# Patient Record
Sex: Female | Born: 1935
Health system: Southern US, Community
[De-identification: ages and names within clinical notes are randomized; demographics above are authoritative.]

## PROBLEM LIST (undated history)

## (undated) DIAGNOSIS — Z87891 Personal history of nicotine dependence: Secondary | ICD-10-CM

## (undated) DIAGNOSIS — Z8619 Personal history of other infectious and parasitic diseases: Secondary | ICD-10-CM

## (undated) DIAGNOSIS — M25579 Pain in unspecified ankle and joints of unspecified foot: Secondary | ICD-10-CM

## (undated) DIAGNOSIS — I10 Essential (primary) hypertension: Secondary | ICD-10-CM

## (undated) DIAGNOSIS — E785 Hyperlipidemia, unspecified: Secondary | ICD-10-CM

## (undated) DIAGNOSIS — E559 Vitamin D deficiency, unspecified: Principal | ICD-10-CM

## (undated) DIAGNOSIS — B029 Zoster without complications: Secondary | ICD-10-CM

## (undated) DIAGNOSIS — B019 Varicella without complication: Secondary | ICD-10-CM

## (undated) DIAGNOSIS — Z86010 Personal history of colonic polyps: Secondary | ICD-10-CM

## (undated) DIAGNOSIS — Z0000 Encounter for general adult medical examination without abnormal findings: Secondary | ICD-10-CM

## (undated) DIAGNOSIS — R945 Abnormal results of liver function studies: Secondary | ICD-10-CM

## (undated) DIAGNOSIS — E119 Type 2 diabetes mellitus without complications: Secondary | ICD-10-CM

## (undated) DIAGNOSIS — L209 Atopic dermatitis, unspecified: Secondary | ICD-10-CM

## (undated) DIAGNOSIS — R739 Hyperglycemia, unspecified: Secondary | ICD-10-CM

## (undated) DIAGNOSIS — H43391 Other vitreous opacities, right eye: Secondary | ICD-10-CM

## (undated) HISTORY — DX: Atopic dermatitis, unspecified: L20.9

## (undated) HISTORY — DX: Other vitreous opacities, right eye: H43.391

## (undated) HISTORY — DX: Personal history of other infectious and parasitic diseases: Z86.19

## (undated) HISTORY — DX: Type 2 diabetes mellitus without complications: E11.9

## (undated) HISTORY — DX: Personal history of nicotine dependence: Z87.891

## (undated) HISTORY — DX: Varicella without complication: B01.9

## (undated) HISTORY — DX: Personal history of colonic polyps: Z86.010

## (undated) HISTORY — DX: Vitamin D deficiency, unspecified: E55.9

## (undated) HISTORY — DX: Essential (primary) hypertension: I10

## (undated) HISTORY — DX: Hyperlipidemia, unspecified: E78.5

## (undated) HISTORY — DX: Encounter for general adult medical examination without abnormal findings: Z00.00

## (undated) HISTORY — DX: Zoster without complications: B02.9

## (undated) HISTORY — DX: Hyperglycemia, unspecified: R73.9

## (undated) HISTORY — DX: Abnormal results of liver function studies: R94.5

## (undated) HISTORY — DX: Pain in unspecified ankle and joints of unspecified foot: M25.579

---

## 1960-07-04 LAB — HM PAP SMEAR

## 1998-01-30 ENCOUNTER — Emergency Department (HOSPITAL_COMMUNITY): Admission: EM | Admit: 1998-01-30 | Discharge: 1998-01-30 | Payer: Self-pay | Admitting: Emergency Medicine

## 1998-04-24 ENCOUNTER — Encounter: Admission: RE | Admit: 1998-04-24 | Discharge: 1998-06-24 | Payer: Self-pay | Admitting: Anesthesiology

## 2000-12-17 ENCOUNTER — Encounter: Payer: Self-pay | Admitting: Emergency Medicine

## 2000-12-17 ENCOUNTER — Inpatient Hospital Stay (HOSPITAL_COMMUNITY): Admission: EM | Admit: 2000-12-17 | Discharge: 2000-12-18 | Payer: Self-pay | Admitting: *Deleted

## 2004-06-12 ENCOUNTER — Emergency Department (HOSPITAL_COMMUNITY): Admission: EM | Admit: 2004-06-12 | Discharge: 2004-06-12 | Payer: Self-pay | Admitting: *Deleted

## 2004-06-14 ENCOUNTER — Encounter: Admission: RE | Admit: 2004-06-14 | Discharge: 2004-06-14 | Payer: Self-pay | Admitting: Family Medicine

## 2006-05-03 ENCOUNTER — Emergency Department (HOSPITAL_COMMUNITY): Admission: EM | Admit: 2006-05-03 | Discharge: 2006-05-03 | Payer: Self-pay | Admitting: *Deleted

## 2006-07-04 DIAGNOSIS — H269 Unspecified cataract: Secondary | ICD-10-CM | POA: Insufficient documentation

## 2006-07-04 HISTORY — PX: CATARACT EXTRACTION W/ INTRAOCULAR LENS  IMPLANT, BILATERAL: SHX1307

## 2007-05-24 ENCOUNTER — Ambulatory Visit (HOSPITAL_COMMUNITY): Admission: RE | Admit: 2007-05-24 | Discharge: 2007-05-24 | Payer: Self-pay | Admitting: Ophthalmology

## 2007-06-21 ENCOUNTER — Ambulatory Visit (HOSPITAL_COMMUNITY): Admission: RE | Admit: 2007-06-21 | Discharge: 2007-06-21 | Payer: Self-pay | Admitting: Ophthalmology

## 2010-07-24 ENCOUNTER — Encounter: Payer: Self-pay | Admitting: Unknown Physician Specialty

## 2010-11-19 NOTE — Consult Note (Signed)
[facility]  Patient:    Natalie Swanson, Natalie Swanson                    MRN: [id number] Proc. Date: 12/17/00 Adm. Date:  [id number] Attending:  Koren Bound                          Consultation Report  HISTORY OF PRESENT ILLNESS:  Natalie Swanson is a 75 year old with no significant past medical history.  She presents to [facility] Emergency Room with episodes of chest pain.  The patient has been in relatively good health. For the past week or so, she has had some intermittent episodes of chest pain. These episodes of pain typically are in the middle of her chest and radiate up to her neck and to her back between her shoulder blades.  These episodes of pain are typically worse with exertion and improved with rest.  She has not had any pleuritic chest pain.  She has had significant episodes of pain with exertion.  CURRENT MEDICATIONS:  None.  ALLERGIES:  She has no known drug allergies.  PAST MEDICAL HISTORY:  None.  SOCIAL HISTORY:  The patient smokes a half a pack a day.  She does not drink any alcohol.  FAMILY HISTORY:  Strongly positive for coronary artery disease.  REVIEW OF SYSTEMS:  Her review of systems was reviewed today and is essentially negative.  PHYSICAL EXAMINATION:  GENERAL:  This is a middle-aged female in no acute distress.  She is currently pain-free.  VITAL SIGNS:  Her blood pressure is 146/76, heart rate is 88 and her temperature is 98.2.  HEENT/NECK:  Exam reveals 2+ carotids.  There are no bruits and no JVD.  LUNGS:  Clear to auscultation.  HEART:  Regular rate.  S4, S1, S2.  ABDOMEN:  Good bowel sounds.  She has no hepatosplenomegaly and no masses or bruits.  EXTREMITIES:  No clubbing, cyanosis, or edema.  Her calves are nontender.  Her pulses are 2+ and symmetric bilaterally.  NEUROLOGIC:  Exam is nonfocal.  Her cranial nerves II-XII are intact and her motor and sensory function are intact.  LABORATORY  DATA:  Her EKG reveals normal sinus rhythm.  She has nonspecific ST and T wave abnormalities.  There is no obvious ischemia.  Her troponin is 0.02.  Her CPK is 99 with 0.8 MBs.  Her sodium is 136, potassium is 3.3, chloride is 104, CO2 is 27, glucose is 92, BUN is 5, creatinine 0.8.  Her SGOT is 19, SGPT is 15, bilirubin is 0.5.  Her WBC is 5.1, hemoglobin is 12.5, hematocrit is 36.3, platelet count is 190,000.  Her pro time is 13.4 with an INR of 1.1.  ASSESSMENT:  The patient presents with symptoms consistent with unstable angina.  Her symptoms are quite concerning and she does have some very mild although nonspecific ST and T wave abnormalities.  At this point, I would prefer that we proceed with heart catheterization.  We discussed the risks, benefits and options of heart catheterization.  She understands and agrees to proceed.  We will check a fasting lipid profile; we will also supplement her potassium. DD:  12/17/00 TD:  12/18/00 Job: [id number]

## 2010-11-19 NOTE — H&P (Signed)
[place]  Patient:    Natalie Swanson, Natalie Swanson                    MRN: [id number] Adm. Date:  [id number] Attending:  Koren Bound                         History and Physical  No dictation... DD:  12/17/00 TD:  12/17/00 Job: [id number]

## 2010-11-19 NOTE — Discharge Summary (Signed)
[facility]  Patient:    Natalie Swanson, Natalie Swanson                    MRN: [id number] Adm. Date:  [id number] Disc. Date: [id number] Attending:  Koren Bound                           Discharge Summary  DISCHARGE DIAGNOSIS:  Noncardiac chest pain.  PROCEDURE:  Heart catheterization.  DISCHARGE MEDICATIONS: 1. Enteric coated aspirin once a day. 2. Nitroglycerin 0.4 mg as needed. 3. Paxil 20 mg a day.  FOLLOWUP:  The patient is to see Dr. Elease Hashimoto in one week.  HISTORY OF PRESENT ILLNESS:  Ms. Castell is a middle-aged female who was admitted through the [facility] Emergency Room with episodes of chest pain. Please see dictated H&P for further details.  HOSPITAL COURSE:  The patient ruled out for myocardial infarction.  A heart catheterization done the following morning revealed mild coronary artery disease.  She had a tight stenosis of a very small diagonal branch which was too small for angioplasty.  It was decided to treat her medically.  She was stable and was discharged in satisfactory condition.  She will follow up with Dr. Elease Hashimoto for further management of her very minimal coronary artery disease. DD:  01/03/01 TD:  01/04/01 Job: [id number]

## 2010-11-19 NOTE — Cardiovascular Report (Signed)
[place]. [facility]  Patient:    Natalie Swanson, Natalie Swanson                    MRN: [id number] Proc. Date: 12/18/00 Adm. Date:  [id number] Attending:  Koren Bound                        Cardiac Catheterization  INDICATIONS:  The patient is a 75 year old female, with no significant past medical history.  She was admitted through the [facility] Emergency Room last night with episodes of chest pain with exertion.  She is referred for a heart catheterization for further evaluation.  PROCEDURES:  Left heart catheterization with coronary angiography with left ventriculogram and an aortic root shot.  DESCRIPTION OF PROCEDURE:  The right femoral artery was easily cannulated using the modified Seldinger technique.  HEMODYNAMICS:  The left ventricular pressures were 138/10.  The aortic pressure was 138/81.  ANGIOGRAPHY:  The left main coronary artery has minor luminal irregularities. There are no plaque ruptures and no severe stenoses.  The left anterior descending artery is a moderate to small vessel.  There are minor to moderate irregularities in the proximal segment.  The mid LAD is fairly smooth and normal.  The distal LAD tapers fairly quickly.  There is a moderate sized diagonal branch which has a long 70-80% stenosis.  This diagonal branch is approximately 1 to 1.5 mm in diameter and is not a good candidate for PTCA.  The left circumflex artery is a moderate to large vessel.  It gives off a large obtuse marginal artery which is fairly normal.  There are a few minor luminal irregularities in the proximal aspect of this vessel.  The distal circumflex artery continues around and supplies a posterolateral branch. There is a discrete 20% stenosis but otherwise the circumflex artery is unremarkable.  The right coronary artery is a moderate to large vessel and is dominant. There is mild diffuse irregularities in the entire mid and distal segment. The  posterior descending artery is relatively free of disease.  There is a very small posterolateral segment artery.  LEFT VENTRICULOGRAM:  The left ventriculogram was performed in a 30% RAO position.  It reveals overall normal left ventricular systolic function.  The ejection fraction is 65-70%.  There is no mitral regurgitation.  An aortic root shot was performed because of her chest pain and the lack of significant coronary artery disease.  She was found to have a normal aortic root.  There is no evidence of dissection or aneurysm.  The proximal aspect of the great vessels are normal.  COMPLICATIONS:  None.  CONCLUSIONS: 1. Mild to moderate coronary artery irregularities.  Her tightest stenosis is    in a very small diagonal branch which is not a candidate for angioplasty.    We will continue with medical therapy.  She will need to stop smoking. 2. Normal left ventricular systolic function. DD:  12/18/00 TD:  12/18/00 Job: [id number]

## 2011-03-05 DIAGNOSIS — H43391 Other vitreous opacities, right eye: Secondary | ICD-10-CM

## 2011-03-05 DIAGNOSIS — B029 Zoster without complications: Secondary | ICD-10-CM

## 2011-03-05 HISTORY — DX: Other vitreous opacities, right eye: H43.391

## 2011-03-05 HISTORY — DX: Zoster without complications: B02.9

## 2011-04-06 ENCOUNTER — Ambulatory Visit (INDEPENDENT_AMBULATORY_CARE_PROVIDER_SITE_OTHER): Payer: Medicare HMO | Admitting: Family Medicine

## 2011-04-06 ENCOUNTER — Encounter: Payer: Self-pay | Admitting: Family Medicine

## 2011-04-06 VITALS — BP 126/80 | HR 70 | Temp 98.1°F | Ht 59.25 in | Wt 130.1 lb

## 2011-04-06 DIAGNOSIS — H43391 Other vitreous opacities, right eye: Secondary | ICD-10-CM

## 2011-04-06 DIAGNOSIS — B059 Measles without complication: Secondary | ICD-10-CM | POA: Insufficient documentation

## 2011-04-06 DIAGNOSIS — I10 Essential (primary) hypertension: Secondary | ICD-10-CM

## 2011-04-06 DIAGNOSIS — B269 Mumps without complication: Secondary | ICD-10-CM | POA: Insufficient documentation

## 2011-04-06 DIAGNOSIS — Z8619 Personal history of other infectious and parasitic diseases: Secondary | ICD-10-CM | POA: Insufficient documentation

## 2011-04-06 DIAGNOSIS — Z23 Encounter for immunization: Secondary | ICD-10-CM

## 2011-04-06 DIAGNOSIS — Z87448 Personal history of other diseases of urinary system: Secondary | ICD-10-CM

## 2011-04-06 DIAGNOSIS — Z87440 Personal history of urinary (tract) infections: Secondary | ICD-10-CM

## 2011-04-06 DIAGNOSIS — E785 Hyperlipidemia, unspecified: Secondary | ICD-10-CM

## 2011-04-06 DIAGNOSIS — H43399 Other vitreous opacities, unspecified eye: Secondary | ICD-10-CM

## 2011-04-06 DIAGNOSIS — B019 Varicella without complication: Secondary | ICD-10-CM | POA: Insufficient documentation

## 2011-04-06 DIAGNOSIS — Z0000 Encounter for general adult medical examination without abnormal findings: Secondary | ICD-10-CM

## 2011-04-06 MED ORDER — PROPRANOLOL HCL 40 MG PO TABS: 40.0000 mg | ORAL_TABLET | Freq: Two times a day (BID) | ORAL | Status: DC

## 2011-04-06 MED ORDER — TETANUS-DIPHTH-ACELL PERTUSSIS 5-2.5-18.5 LF-MCG/0.5 IM SUSP: 0.5000 mL | Freq: Once | INTRAMUSCULAR | Status: DC

## 2011-04-06 NOTE — Patient Instructions (Signed)
Preventative Care for Adults - Female Studies show that half of deaths in the [place] today result from unhealthy lifestyle practices. This includes ignoring preventive care suggestions. Preventive health guidelines for women include the following key practices:  A routine yearly physical is a good way to check with your primary caregiver about your health and preventive screening. It is a chance to share any concerns and updates on your health, and to receive a thorough all-over exam.   If you smoke cigarettes, find out from your caregiver how to quit. It can literally save your life, no matter how long you have been a tobacco user. If you do not use tobacco, never start.   Maintain a healthy diet and normal weight. Increased weight leads to problems with blood pressure and diabetes. Decrease saturated fat in your diet and increase regular exercise. Eat a variety of foods, including fruit, vegetables, animal or vegetable protein (meat, fish, chicken, and eggs, or beans, lentils, and tofu), and grains, such as rice. Get information about proper diet from your caregiver, if needed.   Aerobic exercise helps maintain good heart health. The CDC and the American College of Sports Medicine recommend 30 minutes of moderate-intensity exercise (a brisk walk that increases your heart rate and breathing) on most days of the week. Ongoing high blood pressure should be treated with medicines, if weight loss and exercise are not effective.   Avoid smoking, drinking too much alcohol (more than two drinks per day), and use of street drugs. Do not share needles with anyone. Ask for professional help if you need support or instructions about stopping the use of alcohol, cigarettes, or drugs.   Maintain normal blood lipids and cholesterol, by minimizing your intake of saturated fat. Eat a well rounded diet, with plenty of fruit and vegetables. The National Institutes of Health encourage women to eat 5-9 servings of  fruit and vegetables each day. Your caregiver can give instructions to help you keep your risk of heart disease or stroke low. High blood pressure causes heart disease and increases risk of stroke. Blood pressure should be checked every 1-2 years, from age 20 onward.   Blood tests for high cholesterol, which causes heart and vessel disease, should begin at age 20 and be repeated every 5 years, if test results are normal. (Repeat tests more often if results are high.)   Diabetes screening involves taking a blood sample to check your blood sugar level, after a fasting period. This is done once every 3 years, after age 45, if test results are normal.   Breast cancer screening is essential to preventive care for women. All women age 20 and older should perform a breast self-exam every month. At age 40 and older, women should have their caregiver complete a breast exam each year. Women at ages 40-50 should have a mammogram (x-ray film) of the breasts each year. Your caregiver can discuss when to start your yearly mammograms.   Cervical cancer screening includes taking a Pap smear (sample of cells examined under a microscope) from the cervix (end of the uterus). It also includes testing for HPV (Human Papilloma Virus, which can cause cervical cancer). Screening and a pelvic exam should begin at age 21, or 3 years after a woman becomes sexually active. Screening should occur every year, with a Pap smear but no HPV testing, up to age 30. After age 30, you should have a Pap smear every 3 years with HPV testing, if no HPV was found previously.     Colon cancer can be detected, and often prevented, long before it is life threatening. Most routine colon cancer screening begins at the age of 50. On a yearly basis, your caregiver may provide easy-to-use take-home tests to check for hidden blood in the stool. Use of a small camera at the end of a tube, to directly examine the colon (sigmoidoscopy or colonoscopy), can  detect the earliest forms of colon cancer and can be life saving. Talk to your caregiver about this at age 50, when routine screening begins. (Screening is repeated every 5 years, unless early forms of pre-cancerous polyps or small growths are found.)   Practice safe sex. Use condoms. Condoms are used for birth control and to reduce the spread of sexually transmitted infections (STIs). Unsafe sex is sexual activity without the use of safeguards, such as condoms and avoidance of high-risk acts, to reduce the chances of getting or spreading STIs. STIs include gonorrhea (the clap), chlamydia, syphilis, trichimonas, herpes, HPV (human papilloma virus) and HIV (human immunodeficiency virus), which causes AIDS. Herpes, HIV, and HPV are viral illnesses that have no cure. They can result in disability, cancer, and death.   HPV causes cancer of the cervix, and other infections that can be transmitted from person to person. There is a vaccine for HPV, and females should get immunized between the ages of 11 and 26. It requires a series of 3 shots.   Osteoporosis is a disease in which the bones lose minerals and strength as we age. This can result in serious bone fractures. Risk of osteoporosis can be identified using a bone density scan. Women ages 65 and over should discuss this with their caregivers, as should women after menopause who have other risk factors. Ask your caregiver whether you should be taking a calcium supplement and Vitamin D, to reduce the rate of osteoporosis.   Menopause can be associated with physical symptoms and risks. Hormone replacement therapy is available to decrease these. You should talk to your caregiver about whether starting or continuing to take hormones is right for you.   Use sunscreen with SPF (skin protection factor) of 15 or more. Apply sunscreen liberally and repeatedly throughout the day. Being outside in the sun, when your shadow is shorter than you are, means you are being  exposed to sun at greater intensity. Lighter skinned people are at a greater risk of skin cancer. Wear sunglasses, to protect your eyes from too much damaging sunlight (which can speed up cataract formation).   Once a month, do a whole body skin exam or review, using a mirror to look at your back. Notify your caregiver of changes in moles, especially if there are changes in shapes, colors, irregular border, a size larger than a pencil eraser, or new moles develop.   Keep carbon monoxide and smoke detectors in your home, and functioning, at all times. Change the batteries every 6 months, or use a model that plugs into the wall.   Stay up to date with your tetanus shots and other required immunizations. A booster for tetanus should be given every 10 years. Be sure to get your flu shot every year, since 5%-20% of the [place] population comes down with the flu. The composition of the flu vaccine changes each year, so being vaccinated once is not enough. Get your shot in the fall, before the flu season peaks. The table below lists important vaccines to get. Other vaccines to consider include for Hepatitis A virus (to prevent a form of   infection of the liver, by a virus acquired from food), Varicella Zoster (a virus that causes shingles), and Meninogoccal (against bacteria which cause a form of meningitis).   Brush your teeth twice a day with fluoride toothpaste, and floss once a day. Good oral hygiene prevents tooth decay and gum disease, which can be painful and can cause other health problems. Visit your dentist for a routine oral and dental check up and preventive care every 6-12 months.   The Body Mass Index or BMI is a way of measuring how much of your body is fat. Having a BMI above 27 increases the risk of heart disease, diabetes, hypertension, stroke and other problems related to obesity. Your caregiver can help determine your BMI, and can develop an exercise and dietary program to help you achieve or  maintain this measurement at a healthy level.   Wear seat belts whenever you are in a vehicle, whether as passenger or driver, and even for short drives of a few minutes.   If you bicycle, wear a helmet at all times.  Preventative Care for Adult Women  Preventative Services Ages 42-39 Ages 59-64 Ages 42 and over  Health risk assessment and lifestyle counseling.     Blood pressure check.** Every 1-2 years Every 1-2 years Every 1-2 years  Total cholesterol check including HDL.** Every 5 years beginning at age 89 Every 5 years beginning at age 62, or more often if risk is high Every 5 years through age 8, then optional  Breast self exam. Monthly in all women ages 79 and older Monthly Monthly  Clinical breast exam.** Every 3 years beginning at age 69 Every year Every year  Mammogram.**  Every year beginning at age 75, optional from age 72-49 (discuss with your caregiver). Every year until age 73, then optional  Pap Smear** and HPV Screening. Every year from ages 35 through 50 Every 3 years from ages 32 through 40, if HPV is negative Optional; talk with your caregiver  Flexible sigmoidoscopy** or colonoscopy.**   Every 5 years beginning at age 10 Every 5 years until age 47; then optional  FOBT (fecal occult blood test) of stool.  Every year beginning at age 24 Every year until 86; then optional  Skin self-exam. Monthly Monthly Monthly  Tetanus-diphtheria (Td) immunization. Every 10 years Every 10 years Every 10 years  Influenza immunization.** Every year Every year Every year  HPV immunization. Once between the ages of 62 and 46     Pneumococcal immunization.** Optional Optional Every 5 years  Hepatitis B immunization.** Series of 3 immunizations  (if not done previously, usually given at 0, 1 to 2, and 4 to 6 months)  Check with your caregiver, if vaccination not previously given Check with your caregiver, if vaccination not previously given  ** Family history and personal history of risk and  conditions may change your caregiver's recommendations.  Document Released: 08/16/2001 Document Re-Released: 09/14/2009 [facility] Patient Information 2011 [place], [place].   For Diet consider the DASH diet  Avoid all Trans fats/Partially Hydrogenated Oils, can be found in breads, crackers, cookies, nuts, peanut butter, frozen and/or fast food. Any cheesy/creamy/buttery food is better.  Animal fats are known  As Saturated Fats and should be minimimized   Vegetable fats are polyunsaturated fats and are good for you

## 2011-04-12 LAB — BASIC METABOLIC PANEL
BUN: 6
CO2: 29
Calcium: 9.1
Chloride: 102
Creatinine, Ser: 0.74
GFR calc Af Amer: >60
GFR calc non Af Amer: >60
Glucose, Bld: 145 — ABNORMAL HIGH
Potassium: 3.8
Sodium: 136

## 2011-04-12 LAB — HEMOGLOBIN AND HEMATOCRIT, BLOOD
HCT: 38
Hemoglobin: 12.8

## 2011-04-14 ENCOUNTER — Encounter: Payer: Self-pay | Admitting: Family Medicine

## 2011-04-17 DIAGNOSIS — Z87448 Personal history of other diseases of urinary system: Secondary | ICD-10-CM | POA: Insufficient documentation

## 2011-04-17 DIAGNOSIS — Z0000 Encounter for general adult medical examination without abnormal findings: Secondary | ICD-10-CM | POA: Insufficient documentation

## 2011-04-17 HISTORY — DX: Encounter for general adult medical examination without abnormal findings: Z00.00

## 2011-04-17 NOTE — Assessment & Plan Note (Signed)
Stable and following with opthamology

## 2011-04-17 NOTE — Progress Notes (Signed)
Natalie Swanson [id number] Nov 28, 1935 04/17/2011      Progress Note New Patient  Subjective  Chief Complaint  Chief Complaint  Patient presents with  . Establish Care    new patient    HPI  Patient is a 75 year old Caucasian female who is in today for new patient appointment. In general she approaches in good health and offers no acute concerns at today's visit. She has had some trouble with some floaters in her right eye but is following with ophthalmology and has had no recent progression. No recent illness, fevers, chills, chest pain, palpitations, shortness of breath, GI or GU complaints. She does note a history of pyelonephritis 20 years ago but has had no recurrence. She had a shingle shot in September of 2011  Past Medical History  Diagnosis Date  . Shingles 9-12  . Chicken pox as a child  . Flashers or floaters, right eye 9-12    floaters  . Mumps as a child  . Measles as a child  . Cataract 07-04-2006    b/l  . Hypertension   . Hyperlipidemia     Past Surgical History  Procedure Date  . Cataracts removed 07-04-2006  . Cesarean section 1981    X 1    Family History  Problem Relation Age of Onset  . Hyperlipidemia Mother   . Hypertension Mother   . Heart attack Father   . Diabetes Father     type 2  . Heart attack Brother   . Heart disease Brother 31    MI  . Hemophilia Son   . Hypertension Son   . Hemophilia Son     History   Social History  . Marital Status: Married    Spouse Name: N/A    Number of Children: N/A  . Years of Education: N/A   Occupational History  . Not on file.   Social History Main Topics  . Smoking status: Current Some Day Smoker  . Smokeless tobacco: Never Used  . Alcohol Use: No  . Drug Use: No  . Sexually Active: No   Other Topics Concern  . Not on file   Social History Narrative  . No narrative on file    No current outpatient prescriptions on file prior to visit.   No current facility-administered  medications on file prior to visit.    Allergies  Allergen Reactions  . Sulfa Antibiotics     Review of Systems  Review of Systems  Constitutional: Negative for fever, chills and malaise/fatigue.  HENT: Negative for hearing loss, nosebleeds and congestion.   Eyes: Negative for blurred vision, double vision, photophobia, pain, discharge and redness.       Floaters on right  Respiratory: Negative for cough, sputum production, shortness of breath and wheezing.   Cardiovascular: Negative for chest pain, palpitations and leg swelling.  Gastrointestinal: Negative for heartburn, nausea, vomiting, abdominal pain, diarrhea, constipation and blood in stool.  Genitourinary: Negative for dysuria, urgency, frequency and hematuria.  Musculoskeletal: Negative for myalgias, back pain and falls.  Skin: Negative for rash.  Neurological: Negative for dizziness, tremors, sensory change, focal weakness, loss of consciousness, weakness and headaches.  Endo/Heme/Allergies: Negative for polydipsia. Does not bruise/bleed easily.  Psychiatric/Behavioral: Negative for depression and suicidal ideas. The patient is not nervous/anxious and does not have insomnia.     Objective  BP 126/80  Pulse 70  Temp(Src) 98.1 F (36.7 C) (Oral)  Ht 4' 11.25" (1.505 m)  Wt 130 lb 1.9 oz (59.022  kg)  BMI 26.06 kg/m2  SpO2 97%  Physical Exam  Physical Exam  Constitutional: She is oriented to person, place, and time and well-developed, well-nourished, and in no distress. No distress.  HENT:  Head: Normocephalic and atraumatic.  Right Ear: External ear normal.  Left Ear: External ear normal.  Nose: Nose normal.  Mouth/Throat: Oropharynx is clear and moist. No oropharyngeal exudate.  Eyes: Conjunctivae are normal. Pupils are equal, round, and reactive to light. Right eye exhibits no discharge. Left eye exhibits no discharge. No scleral icterus.  Neck: Normal range of motion. Neck supple. No thyromegaly present.    Cardiovascular: Normal rate, regular rhythm, normal heart sounds and intact distal pulses.   No murmur heard. Pulmonary/Chest: Effort normal and breath sounds normal. No respiratory distress. She has no wheezes. She has no rales.  Abdominal: Soft. Bowel sounds are normal. She exhibits no distension and no mass. There is no tenderness.  Musculoskeletal: Normal range of motion. She exhibits no edema and no tenderness.  Lymphadenopathy:    She has no cervical adenopathy.  Neurological: She is alert and oriented to person, place, and time. She has normal reflexes. No cranial nerve deficit. Coordination normal.  Skin: Skin is warm and dry. No rash noted. She is not diaphoretic.  Psychiatric: Mood, memory and affect normal.       Assessment & Plan  Hypertension Adequately controlled on recheck, no changes to regimen today  Flashers or floaters, right eye Stable and following with opthamology  Hyperlipidemia Avoid trans fats, add a fish or flaxseed oil cap daily and lipid panel  Preventative health care Had Zostavax in September of 2012, took flu shot today, took Tdap today

## 2011-04-17 NOTE — Assessment & Plan Note (Signed)
Adequately controlled on recheck, no changes to regimen today

## 2011-04-17 NOTE — Assessment & Plan Note (Addendum)
Had Zostavax in September of 2012, took flu shot today, took Tdap today

## 2011-04-17 NOTE — Assessment & Plan Note (Signed)
Avoid trans fats, add a fish or flaxseed oil cap daily and lipid panel

## 2011-04-28 DIAGNOSIS — Z1211 Encounter for screening for malignant neoplasm of colon: Secondary | ICD-10-CM

## 2011-04-28 LAB — FECAL OCCULT BLOOD, IMMUNOCHEMICAL: Fecal Occult Bld: NEGATIVE

## 2011-05-31 ENCOUNTER — Other Ambulatory Visit (INDEPENDENT_AMBULATORY_CARE_PROVIDER_SITE_OTHER): Payer: Medicare HMO

## 2011-05-31 DIAGNOSIS — Z0000 Encounter for general adult medical examination without abnormal findings: Secondary | ICD-10-CM

## 2011-05-31 DIAGNOSIS — Z79899 Other long term (current) drug therapy: Secondary | ICD-10-CM

## 2011-05-31 DIAGNOSIS — I10 Essential (primary) hypertension: Secondary | ICD-10-CM

## 2011-05-31 LAB — LIPID PANEL
Cholesterol: 237 mg/dL — ABNORMAL HIGH (ref 0–200)
HDL: 56.9 mg/dL (ref >39.00)
Total CHOL/HDL Ratio: 4
Triglycerides: 164 mg/dL — ABNORMAL HIGH (ref 0.0–149.0)
VLDL: 32.8 mg/dL (ref 0.0–40.0)

## 2011-05-31 LAB — HEPATIC FUNCTION PANEL
ALT: 18 U/L (ref 0–35)
AST: 21 U/L (ref 0–37)
Albumin: 4.1 g/dL (ref 3.5–5.2)
Alkaline Phosphatase: 88 U/L (ref 39–117)
Bilirubin, Direct: 0.1 mg/dL (ref 0.0–0.3)
Total Bilirubin: 0.6 mg/dL (ref 0.3–1.2)
Total Protein: 7.3 g/dL (ref 6.0–8.3)

## 2011-05-31 LAB — RENAL FUNCTION PANEL
Albumin: 4.1 g/dL (ref 3.5–5.2)
BUN: 14 mg/dL (ref 6–23)
CO2: 25 mEq/L (ref 19–32)
Calcium: 9.5 mg/dL (ref 8.4–10.5)
Chloride: 105 mEq/L (ref 96–112)
Creatinine, Ser: 0.9 mg/dL (ref 0.4–1.2)
GFR: 61.99 mL/min (ref >60.00)
Glucose, Bld: 109 mg/dL — ABNORMAL HIGH (ref 70–99)
Phosphorus: 3.9 mg/dL (ref 2.3–4.6)
Potassium: 4.7 mEq/L (ref 3.5–5.1)
Sodium: 139 mEq/L (ref 135–145)

## 2011-05-31 LAB — TSH: TSH: 1.42 u[IU]/mL (ref 0.35–5.50)

## 2011-05-31 LAB — CBC
HCT: 41.2 % (ref 36.0–46.0)
Hemoglobin: 13.9 g/dL (ref 12.0–15.0)
MCHC: 33.7 g/dL (ref 30.0–36.0)
MCV: 94.9 fl (ref 78.0–100.0)
Platelets: 236 10*3/uL (ref 150.0–400.0)
RBC: 4.34 Mil/uL (ref 3.87–5.11)
RDW: 13.7 % (ref 11.5–14.6)
WBC: 7.4 10*3/uL (ref 4.5–10.5)

## 2011-05-31 LAB — LDL CHOLESTEROL, DIRECT: Direct LDL: 157.7 mg/dL

## 2011-06-07 ENCOUNTER — Other Ambulatory Visit (HOSPITAL_COMMUNITY)
Admission: RE | Admit: 2011-06-07 | Discharge: 2011-06-07 | Disposition: A | Discharge status: Home / Self Care | Payer: Medicare HMO | Source: Ambulatory Visit | Attending: Family Medicine | Admitting: Family Medicine

## 2011-06-07 ENCOUNTER — Encounter: Payer: Self-pay | Admitting: Family Medicine

## 2011-06-07 ENCOUNTER — Ambulatory Visit (INDEPENDENT_AMBULATORY_CARE_PROVIDER_SITE_OTHER): Payer: Medicare HMO | Admitting: Family Medicine

## 2011-06-07 VITALS — BP 105/70 | HR 63 | Temp 98.2°F | Ht 59.25 in | Wt 130.0 lb

## 2011-06-07 DIAGNOSIS — Z1159 Encounter for screening for other viral diseases: Secondary | ICD-10-CM | POA: Insufficient documentation

## 2011-06-07 DIAGNOSIS — E785 Hyperlipidemia, unspecified: Secondary | ICD-10-CM

## 2011-06-07 DIAGNOSIS — I10 Essential (primary) hypertension: Secondary | ICD-10-CM

## 2011-06-07 DIAGNOSIS — Z0000 Encounter for general adult medical examination without abnormal findings: Secondary | ICD-10-CM

## 2011-06-07 DIAGNOSIS — Z124 Encounter for screening for malignant neoplasm of cervix: Secondary | ICD-10-CM

## 2011-06-07 NOTE — Patient Instructions (Signed)
Hypercholesterolemia High Blood Cholesterol Cholesterol is a white, waxy, fat-like protein needed by your body in small amounts. The liver makes all the cholesterol you need. It is carried from the liver by the blood through the blood vessels. Deposits (plaque) may build up on blood vessel walls. This makes the arteries narrower and stiffer. Plaque increases the risk for heart attack and stroke. You cannot feel your cholesterol level even if it is very high. The only way to know is by a blood test to check your lipid (fats) levels. Once you know your cholesterol levels, you should keep a record of the test results. Work with your caregiver to to keep your levels in the desired range. WHAT THE RESULTS MEAN:  Total cholesterol is a rough measure of all the cholesterol in your blood.   LDL is the so-called bad cholesterol. This is the type that deposits cholesterol in the walls of the arteries. You want this level to be low.   HDL is the good cholesterol because it cleans the arteries and carries the LDL away. You want this level to be high.   Triglycerides are fat that the body can either burn for energy or store. High levels are closely linked to heart disease.  DESIRED LEVELS:  Total cholesterol below 200.   LDL below 100 for people at risk, below 70 for very high risk.   HDL above 50 is good, above 60 is best.   Triglycerides below 150.  HOW TO LOWER YOUR CHOLESTEROL:  Diet.   Choose fish or white meat chicken and [place], roasted or baked. Limit fatty cuts of red meat, fried foods, and processed meats, such as sausage and lunch meat.   Eat lots of fresh fruits and vegetables. Choose whole grains, beans, pasta, potatoes and cereals.   Use only small amounts of olive, corn or canola oils. Avoid butter, mayonnaise, shortening or palm kernel oils. Avoid foods with trans-fats.   Use skim/nonfat milk and low-fat/nonfat yogurt and cheeses. Avoid whole milk, cream, ice cream, egg yolks and  cheeses. Healthy desserts include angel food cake, gingersnaps, animal crackers, hard candy, popsicles, and low-fat/nonfat frozen yogurt. Avoid pastries, cakes, pies and cookies.   Exercise.   A regular program helps decrease LDL and raises HDL.   Helps with weight control.   Do things that increase your activity level like gardening, walking, or taking the stairs.   Medication.   May be prescribed by your caregiver to help lowering cholesterol and the risk for heart disease.   You may need medicine even if your levels are normal if you have several risk factors.  HOME CARE INSTRUCTIONS   Follow your diet and exercise programs as suggested by your caregiver.   Take medications as directed.   Have blood work done when your caregiver feels it is necessary.  MAKE SURE YOU:   Understand these instructions.   Will watch your condition.   Will get help right away if you are not doing well or get worse.  Document Released: 06/20/2005 Document Revised: 03/02/2011 Document Reviewed: 12/06/2006 [facility] Patient Information 2012 [place], [place].   Add a fiber supplement such as Benefiber daily, consider switching to [facility] caps daily and continue to avoid trans fats and saturated fatss

## 2011-06-07 NOTE — Progress Notes (Signed)
Natalie Swanson [id number] 10-Sep-1935 06/07/2011      Progress Note-Follow Up  Subjective  Chief Complaint  Chief Complaint  Patient presents with  . Gynecologic Exam    pap smear    HPI  75 Caucasian female in today for GYN exam. She reports feeling well and offers no GYN complaints. No discharge, lesions, abdominal pain, back pain, breast concerns. She reports she changed her diet since last examination is 28 less red meat and saturated fats. No recent febrile illness, congestion, testing, palpitations, shortness of breath, GI or GU complaints  Past Medical History  Diagnosis Date  . Shingles 9-12  . Chicken pox as a child  . Flashers or floaters, right eye 9-12    floaters  . Mumps as a child  . Measles as a child  . Cataract 07-04-2006    b/l  . Hypertension   . Hyperlipidemia     Past Surgical History  Procedure Date  . Cataracts removed 07-04-2006  . Cesarean section 1981    X 1    Family History  Problem Relation Age of Onset  . Hyperlipidemia Mother   . Hypertension Mother   . Heart attack Father   . Diabetes Father     type 2  . Heart attack Brother   . Heart disease Brother 13    MI  . Hemophilia Son   . Hypertension Son   . Hemophilia Son     History   Social History  . Marital Status: Married    Spouse Name: N/A    Number of Children: N/A  . Years of Education: N/A   Occupational History  . Not on file.   Social History Main Topics  . Smoking status: Current Some Day Smoker  . Smokeless tobacco: Never Used  . Alcohol Use: No  . Drug Use: No  . Sexually Active: No   Other Topics Concern  . Not on file   Social History Narrative  . No narrative on file    Current Outpatient Prescriptions on File Prior to Visit  Medication Sig Dispense Refill  . BREWERS YEAST PO Take 650 mg by mouth 2 (two) times daily.        . Cholecalciferol (D3-1000 PO) Take 1 tablet by mouth daily.        . fish oil-omega-3 fatty acids 1000 MG capsule Take  2 g by mouth daily.        . propranolol (INDERAL) 40 MG tablet Take 1 tablet (40 mg total) by mouth 2 (two) times daily.  60 tablet  5  . Propylene Glycol (SYSTANE BALANCE) 0.6 % SOLN Apply 2 drops to eye 2 (two) times daily. In right eye         Allergies  Allergen Reactions  . Sulfa Antibiotics     Review of Systems  Review of Systems  Constitutional: Negative for fever and malaise/fatigue.  HENT: Negative for congestion.   Eyes: Negative for discharge.  Respiratory: Negative for shortness of breath.   Cardiovascular: Negative for chest pain, palpitations and leg swelling.  Gastrointestinal: Negative for nausea, abdominal pain and diarrhea.  Genitourinary: Negative for dysuria.  Musculoskeletal: Negative for falls.  Skin: Negative for rash.  Neurological: Negative for loss of consciousness and headaches.  Endo/Heme/Allergies: Negative for polydipsia.  Psychiatric/Behavioral: Negative for depression and suicidal ideas. The patient is not nervous/anxious and does not have insomnia.     Objective  BP 105/70  Pulse 63  Temp(Src) 98.2 F (36.8 C) (  Oral)  Ht 4' 11.25" (1.505 m)  Wt 130 lb (58.968 kg)  BMI 26.04 kg/m2  SpO2 98%  Physical Exam  Physical Exam  Constitutional: She is oriented to person, place, and time and well-developed, well-nourished, and in no distress. No distress.  HENT:  Head: Normocephalic and atraumatic.  Right Ear: External ear normal.  Left Ear: External ear normal.  Nose: Nose normal.  Mouth/Throat: Oropharynx is clear and moist. No oropharyngeal exudate.  Eyes: Conjunctivae are normal. Pupils are equal, round, and reactive to light. Right eye exhibits no discharge. Left eye exhibits no discharge. No scleral icterus.  Neck: Normal range of motion. Neck supple. No thyromegaly present.  Cardiovascular: Normal rate, regular rhythm, normal heart sounds and intact distal pulses.   No murmur heard. Pulmonary/Chest: Effort normal and breath sounds  normal. No respiratory distress. She has no wheezes. She has no rales.  Abdominal: Soft. Bowel sounds are normal. She exhibits no distension and no mass. There is no tenderness.  Genitourinary: Vagina normal, uterus normal, cervix normal, right adnexa normal and left adnexa normal. No vaginal discharge found.  Musculoskeletal: Normal range of motion. She exhibits no edema and no tenderness.  Lymphadenopathy:    She has no cervical adenopathy.  Neurological: She is alert and oriented to person, place, and time. She has normal reflexes. No cranial nerve deficit. Coordination normal.  Skin: Skin is warm and dry. No rash noted. She is not diaphoretic.  Psychiatric: Mood, memory and affect normal.    Lab Results  Component Value Date   TSH 1.42 05/31/2011   Lab Results  Component Value Date   WBC 7.4 05/31/2011   HGB 13.9 05/31/2011   HCT 41.2 05/31/2011   MCV 94.9 05/31/2011   PLT 236.0 05/31/2011   Lab Results  Component Value Date   CREATININE 0.9 05/31/2011   BUN 14 05/31/2011   NA 139 05/31/2011   K 4.7 05/31/2011   CL 105 05/31/2011   CO2 25 05/31/2011   Lab Results  Component Value Date   ALT 18 05/31/2011   AST 21 05/31/2011   ALKPHOS 88 05/31/2011   BILITOT 0.6 05/31/2011   Lab Results  Component Value Date   CHOL 237* 05/31/2011   Lab Results  Component Value Date   HDL 56.90 05/31/2011   No results found for this basename: [facility]   Lab Results  Component Value Date   TRIG 164.0* 05/31/2011   Lab Results  Component Value Date   CHOLHDL 4 05/31/2011    Assessment & Plan  Hypertension Adequately controlled no changes.  Preventative health care In today for pap, no gyn or breast c/o today has not had a pap since at least age 75.   Hyperlipidemia Reviewed labs with patients, encouraged to increase exercise and start a MegaRed cap daily

## 2011-06-09 NOTE — Assessment & Plan Note (Signed)
Adequately controlled no changes.  

## 2011-06-09 NOTE — Assessment & Plan Note (Signed)
Reviewed labs with patients, encouraged to increase exercise and start a MegaRed cap daily

## 2011-06-09 NOTE — Assessment & Plan Note (Signed)
In today for pap, no gyn or breast c/o today has not had a pap since at least age 75.

## 2011-09-23 ENCOUNTER — Other Ambulatory Visit: Payer: Self-pay | Admitting: *Deleted

## 2011-09-23 DIAGNOSIS — I10 Essential (primary) hypertension: Secondary | ICD-10-CM

## 2011-09-23 MED ORDER — PROPRANOLOL HCL 40 MG PO TABS: 40.0000 mg | ORAL_TABLET | Freq: Two times a day (BID) | ORAL | Status: DC

## 2011-09-23 NOTE — Telephone Encounter (Signed)
Pt left vm stating that she needs a prescription sent to [facility] in [place] for her propanolol 40mg , noted pt has been seen in office 06-2011 and has upcoming apt 7-13, rx sent to pharmacy by e-script, called pt to advise the rx has been sent, pt understood

## 2011-09-26 ENCOUNTER — Other Ambulatory Visit: Payer: Self-pay | Admitting: Family Medicine

## 2011-09-27 NOTE — Telephone Encounter (Signed)
PC from patient requesting Propranolol 40mg  called [facility] [place].  Pt has follow up on 01/11/12.  RX sent on 3/22 and per Selena Batten at pharmacy RX was sold on 3/25 @ 8:56am.

## 2012-01-06 ENCOUNTER — Ambulatory Visit: Payer: Medicare HMO | Admitting: Family Medicine

## 2012-01-11 ENCOUNTER — Ambulatory Visit (INDEPENDENT_AMBULATORY_CARE_PROVIDER_SITE_OTHER): Payer: Medicare Other | Admitting: Family Medicine

## 2012-01-11 ENCOUNTER — Encounter: Payer: Self-pay | Admitting: Family Medicine

## 2012-01-11 VITALS — BP 132/88 | Temp 97.8°F | Wt 131.0 lb

## 2012-01-11 DIAGNOSIS — Z87891 Personal history of nicotine dependence: Secondary | ICD-10-CM | POA: Insufficient documentation

## 2012-01-11 DIAGNOSIS — F17200 Nicotine dependence, unspecified, uncomplicated: Secondary | ICD-10-CM

## 2012-01-11 DIAGNOSIS — L259 Unspecified contact dermatitis, unspecified cause: Secondary | ICD-10-CM

## 2012-01-11 DIAGNOSIS — L209 Atopic dermatitis, unspecified: Secondary | ICD-10-CM | POA: Insufficient documentation

## 2012-01-11 DIAGNOSIS — Z720 Tobacco use: Secondary | ICD-10-CM

## 2012-01-11 DIAGNOSIS — L309 Dermatitis, unspecified: Secondary | ICD-10-CM

## 2012-01-11 DIAGNOSIS — I10 Essential (primary) hypertension: Secondary | ICD-10-CM

## 2012-01-11 DIAGNOSIS — L2089 Other atopic dermatitis: Secondary | ICD-10-CM

## 2012-01-11 HISTORY — DX: Personal history of nicotine dependence: Z87.891

## 2012-01-11 HISTORY — DX: Atopic dermatitis, unspecified: L20.9

## 2012-01-11 MED ORDER — TRIAMCINOLONE ACETONIDE 0.1 % EX CREA: TOPICAL_CREAM | Freq: Two times a day (BID) | CUTANEOUS | Status: AC

## 2012-01-11 MED ORDER — DIPHENHYDRAMINE HCL 25 MG PO TABS: 25.0000 mg | ORAL_TABLET | Freq: Every evening | ORAL | Status: DC | PRN

## 2012-01-11 MED ORDER — CETIRIZINE HCL 10 MG PO TABS: 10.0000 mg | ORAL_TABLET | Freq: Every day | ORAL | Status: DC | PRN

## 2012-01-11 NOTE — Progress Notes (Signed)
Patient ID: Natalie Swanson, female   DOB: 07-Jul-1935, 76 y.o.   MRN: [id number] Natalie Swanson [id number] Feb 20, 1936 01/11/2012      Progress Note-Follow Up  Subjective  Chief Complaint  Chief Complaint  Patient presents with  . Follow-up    6 month    HPI  Patient is a 76 year old Caucasian female who is in today for followup. Overall she's doing well. No recent illness, fevers, chills, chest pain, palpitations, shortness of breath, GI or GU complaints. Her biggest complaint today is she has itchy erythematous rash at the base of her neck anteriorly that has been present for roughly 5 days. She denies any new products or new clothing. She denies any jewelry. She's been trying some over-the-counter topical treatments without great success control her symptoms. She's not had any trips to the emergency room acute illnesses or other concerns since she was last seen. She has cut her cigarette use down to 1 or 2 a day and is attempting to quit altogether at this time  Past Medical History  Diagnosis Date  . Shingles 9-12  . Chicken pox as a child  . Flashers or floaters, right eye 9-12    floaters  . Mumps as a child  . Measles as a child  . Cataract 07-04-2006    b/l  . Hypertension   . Hyperlipidemia   . Atopic dermatitis 01/11/2012  . Tobacco abuse 01/11/2012    Past Surgical History  Procedure Date  . Cataracts removed 07-04-2006  . Cesarean section 1981    X 1    Family History  Problem Relation Age of Onset  . Hyperlipidemia Mother   . Hypertension Mother   . Heart attack Father   . Diabetes Father     type 2  . Heart attack Brother   . Heart disease Brother 54    MI  . Hemophilia Son   . Hypertension Son   . Hemophilia Son     History   Social History  . Marital Status: Married    Spouse Name: N/A    Number of Children: N/A  . Years of Education: N/A   Occupational History  . Not on file.   Social History Main Topics  . Smoking status: Current Some  Day Smoker  . Smokeless tobacco: Never Used  . Alcohol Use: No  . Drug Use: No  . Sexually Active: No   Other Topics Concern  . Not on file   Social History Narrative  . No narrative on file    Current Outpatient Prescriptions on File Prior to Visit  Medication Sig Dispense Refill  . BREWERS YEAST PO Take 650 mg by mouth 2 (two) times daily.        . Cholecalciferol (D3-1000 PO) Take 1 tablet by mouth daily.        . fish oil-omega-3 fatty acids 1000 MG capsule Take 2 g by mouth daily.        . propranolol (INDERAL) 40 MG tablet Take 1 tablet (40 mg total) by mouth 2 (two) times daily.  60 tablet  5  . Propylene Glycol (SYSTANE BALANCE) 0.6 % SOLN Apply 2 drops to eye 2 (two) times daily. In right eye       . cetirizine (ZYRTEC) 10 MG tablet Take 1 tablet (10 mg total) by mouth daily as needed for allergies (dermatitis).  30 tablet  3  . diphenhydrAMINE (BENADRYL) 25 MG tablet Take 1 tablet (25 mg total) by mouth  at bedtime as needed for itching, allergies or sleep.  30 tablet  0    Allergies  Allergen Reactions  . Sulfa Antibiotics     Review of Systems  Review of Systems  Constitutional: Negative for fever and malaise/fatigue.  HENT: Negative for congestion.   Eyes: Negative for discharge.  Respiratory: Negative for cough and shortness of breath.   Cardiovascular: Negative for chest pain and palpitations.  Gastrointestinal: Positive for blood in stool. Negative for heartburn, nausea, vomiting, abdominal pain, diarrhea and constipation.  Genitourinary: Negative for dysuria.  Musculoskeletal: Negative for myalgias and back pain.  Skin: Positive for itching and rash.  Neurological: Negative for dizziness, loss of consciousness, weakness and headaches.  Endo/Heme/Allergies: Negative for polydipsia. Does not bruise/bleed easily.  Psychiatric/Behavioral: Negative for depression and suicidal ideas. The patient is not nervous/anxious and does not have insomnia.      Objective  BP 132/88  Temp 97.8 F (36.6 C) (Oral)  Wt 131 lb (59.421 kg)  Physical Exam  Physical Exam  Constitutional: She is oriented to person, place, and time and well-developed, well-nourished, and in no distress. No distress.  HENT:  Head: Normocephalic and atraumatic.  Eyes: Conjunctivae are normal.  Neck: Neck supple. No thyromegaly present.  Cardiovascular: Normal rate, regular rhythm and normal heart sounds.   No murmur heard. Pulmonary/Chest: Effort normal and breath sounds normal. She has no wheezes.  Abdominal: She exhibits no distension and no mass.  Musculoskeletal: She exhibits no edema.  Lymphadenopathy:    She has no cervical adenopathy.  Neurological: She is alert and oriented to person, place, and time.  Skin: Skin is warm and dry. Rash noted. She is not diaphoretic. There is erythema.       Base of anterior neck, raised, erythematous patch roughly 4 in wide by 2 in  Psychiatric: Memory, affect and judgment normal.    Lab Results  Component Value Date   TSH 1.42 05/31/2011   Lab Results  Component Value Date   WBC 7.4 05/31/2011   HGB 13.9 05/31/2011   HCT 41.2 05/31/2011   MCV 94.9 05/31/2011   PLT 236.0 05/31/2011   Lab Results  Component Value Date   CREATININE 0.9 05/31/2011   BUN 14 05/31/2011   NA 139 05/31/2011   K 4.7 05/31/2011   CL 105 05/31/2011   CO2 25 05/31/2011   Lab Results  Component Value Date   ALT 18 05/31/2011   AST 21 05/31/2011   ALKPHOS 88 05/31/2011   BILITOT 0.6 05/31/2011   Lab Results  Component Value Date   CHOL 237* 05/31/2011   Lab Results  Component Value Date   HDL 56.90 05/31/2011   No results found for this basename: [facility]   Lab Results  Component Value Date   TRIG 164.0* 05/31/2011   Lab Results  Component Value Date   CHOLHDL 4 05/31/2011     Assessment & Plan  Hypertension Well controlled at today's visit no change in meds today  Atopic dermatitis At base of anterior  neck, is given Triamcinolone cream to apply bid, Start Cetirizine in am and Benadryl in pm and notify us if no improvement  Tobacco abuse Is down to just 1-2 cig a day. Encouraged her to make new habits and restructure her day to stop the last 2 prior to her next appt

## 2012-01-11 NOTE — Assessment & Plan Note (Signed)
Well controlled at today's visit no change in meds today

## 2012-01-11 NOTE — Assessment & Plan Note (Signed)
At base of anterior neck, is given Triamcinolone cream to apply bid, Start Cetirizine in am and Benadryl in pm and notify us if no improvement

## 2012-01-11 NOTE — Patient Instructions (Addendum)

## 2012-01-11 NOTE — Assessment & Plan Note (Signed)
Is down to just 1-2 cig a day. Encouraged her to make new habits and restructure her day to stop the last 2 prior to her next appt

## 2012-07-06 ENCOUNTER — Other Ambulatory Visit: Payer: Medicare Other

## 2012-07-10 ENCOUNTER — Other Ambulatory Visit: Payer: Medicare Other

## 2012-07-13 ENCOUNTER — Ambulatory Visit: Payer: Medicare Other | Admitting: Family Medicine

## 2012-07-13 ENCOUNTER — Other Ambulatory Visit (INDEPENDENT_AMBULATORY_CARE_PROVIDER_SITE_OTHER): Payer: Medicare Other

## 2012-07-13 DIAGNOSIS — R7309 Other abnormal glucose: Secondary | ICD-10-CM

## 2012-07-13 DIAGNOSIS — I10 Essential (primary) hypertension: Secondary | ICD-10-CM

## 2012-07-13 DIAGNOSIS — Z0000 Encounter for general adult medical examination without abnormal findings: Secondary | ICD-10-CM

## 2012-07-13 LAB — HEPATIC FUNCTION PANEL
ALT: 20 U/L (ref 0–35)
AST: 24 U/L (ref 0–37)
Albumin: 4.1 g/dL (ref 3.5–5.2)
Alkaline Phosphatase: 84 U/L (ref 39–117)
Bilirubin, Direct: 0.1 mg/dL (ref 0.0–0.3)
Total Bilirubin: 0.6 mg/dL (ref 0.3–1.2)
Total Protein: 7 g/dL (ref 6.0–8.3)

## 2012-07-13 LAB — CBC
HCT: 40.9 % (ref 36.0–46.0)
Hemoglobin: 13.5 g/dL (ref 12.0–15.0)
MCHC: 33.1 g/dL (ref 30.0–36.0)
MCV: 93.5 fl (ref 78.0–100.0)
Platelets: 229 10*3/uL (ref 150.0–400.0)
RBC: 4.37 Mil/uL (ref 3.87–5.11)
RDW: 13.9 % (ref 11.5–14.6)
WBC: 7.6 10*3/uL (ref 4.5–10.5)

## 2012-07-13 LAB — RENAL FUNCTION PANEL
Albumin: 4.1 g/dL (ref 3.5–5.2)
BUN: 12 mg/dL (ref 6–23)
CO2: 30 mEq/L (ref 19–32)
Calcium: 9.7 mg/dL (ref 8.4–10.5)
Chloride: 103 mEq/L (ref 96–112)
Creatinine, Ser: 0.7 mg/dL (ref 0.4–1.2)
GFR: 84.06 mL/min (ref >60.00)
Glucose, Bld: 107 mg/dL — ABNORMAL HIGH (ref 70–99)
Phosphorus: 3.2 mg/dL (ref 2.3–4.6)
Potassium: 4.5 mEq/L (ref 3.5–5.1)
Sodium: 137 mEq/L (ref 135–145)

## 2012-07-13 LAB — LIPID PANEL
Cholesterol: 253 mg/dL — ABNORMAL HIGH (ref 0–200)
HDL: 59.9 mg/dL (ref >39.00)
Total CHOL/HDL Ratio: 4
Triglycerides: 190 mg/dL — ABNORMAL HIGH (ref 0.0–149.0)
VLDL: 38 mg/dL (ref 0.0–40.0)

## 2012-07-13 LAB — TSH: TSH: 1.94 u[IU]/mL (ref 0.35–5.50)

## 2012-07-13 LAB — HEMOGLOBIN A1C: Hgb A1c MFr Bld: 6.3 % (ref 4.6–6.5)

## 2012-07-13 LAB — LDL CHOLESTEROL, DIRECT: Direct LDL: 146.8 mg/dL

## 2012-07-13 NOTE — Progress Notes (Signed)
Labs only

## 2012-07-16 ENCOUNTER — Ambulatory Visit: Payer: Medicare Other | Admitting: Family Medicine

## 2012-07-17 ENCOUNTER — Ambulatory Visit: Payer: Medicare Other | Admitting: Family Medicine

## 2012-07-18 ENCOUNTER — Encounter: Payer: Self-pay | Admitting: Family Medicine

## 2012-07-18 ENCOUNTER — Ambulatory Visit (INDEPENDENT_AMBULATORY_CARE_PROVIDER_SITE_OTHER): Payer: Medicare Other | Admitting: Family Medicine

## 2012-07-18 VITALS — BP 112/74 | HR 78 | Temp 97.7°F | Ht 59.25 in | Wt 132.0 lb

## 2012-07-18 DIAGNOSIS — Z0000 Encounter for general adult medical examination without abnormal findings: Secondary | ICD-10-CM

## 2012-07-18 DIAGNOSIS — Z23 Encounter for immunization: Secondary | ICD-10-CM

## 2012-07-18 DIAGNOSIS — Z720 Tobacco use: Secondary | ICD-10-CM

## 2012-07-18 DIAGNOSIS — R739 Hyperglycemia, unspecified: Secondary | ICD-10-CM

## 2012-07-18 DIAGNOSIS — R7309 Other abnormal glucose: Secondary | ICD-10-CM

## 2012-07-18 DIAGNOSIS — E785 Hyperlipidemia, unspecified: Secondary | ICD-10-CM

## 2012-07-18 DIAGNOSIS — E119 Type 2 diabetes mellitus without complications: Secondary | ICD-10-CM

## 2012-07-18 DIAGNOSIS — I10 Essential (primary) hypertension: Secondary | ICD-10-CM

## 2012-07-18 DIAGNOSIS — I152 Hypertension secondary to endocrine disorders: Secondary | ICD-10-CM | POA: Insufficient documentation

## 2012-07-18 DIAGNOSIS — F17200 Nicotine dependence, unspecified, uncomplicated: Secondary | ICD-10-CM

## 2012-07-18 HISTORY — DX: Hyperglycemia, unspecified: R73.9

## 2012-07-18 HISTORY — DX: Type 2 diabetes mellitus without complications: E11.9

## 2012-07-18 MED ORDER — SIMVASTATIN 10 MG PO TABS: 10.0000 mg | ORAL_TABLET | Freq: Every day | ORAL | Status: DC

## 2012-07-18 MED ORDER — PROPRANOLOL HCL 40 MG PO TABS: 40.0000 mg | ORAL_TABLET | Freq: Two times a day (BID) | ORAL | Status: DC

## 2012-07-18 NOTE — Assessment & Plan Note (Signed)
Quit for good, over the new year.

## 2012-07-18 NOTE — Assessment & Plan Note (Signed)
Hgba1c, and asked to consider any significant management diet size. Encouraged her to alter her diet to decrease carbohydrates and use complex carbs whenever possible. We will recheck hemoglobin A1c in 3 months time.

## 2012-07-18 NOTE — Patient Instructions (Addendum)

## 2012-07-18 NOTE — Progress Notes (Signed)
Patient ID: Natalie Swanson, female   DOB: [birth year], 77 y.o.   MRN: [id number] MAEDELL HEDGER [id number] 1936-05-21 07/18/2012      Progress Note-Follow Up  Subjective  Chief Complaint  Chief Complaint  Patient presents with  . Follow-up    6 month  . Injections    flu    HPI  Patient is a 77 year old Caucasian female who is in today for routine followup. She has been feeling well and offers no complaints. She quit smoking several weeks ago and says she feels well. Denies any recent illness, twisting emergency room, headaches, chest pain or palpitations, shortness of breath, GI or GU complaints. She does have a family history of a father who is diabetic so handled the news of her hyperglycemia well. He is more than amenable to starting a cholesterol medication. Denies any polyuria or polydipsia at this time  Past Medical History  Diagnosis Date  . Shingles 9-12  . Chicken pox as a child  . Flashers or floaters, right eye 9-12    floaters  . Mumps as a child  . Measles as a child  . Cataract 07-04-2006    b/l  . Hypertension   . Hyperlipidemia   . Atopic dermatitis 01/11/2012  . Tobacco abuse 01/11/2012  . Hyperglycemia 07/18/2012    Past Surgical History  Procedure Date  . Cataracts removed 07-04-2006  . Cesarean section 1981    X 1    Family History  Problem Relation Age of Onset  . Hyperlipidemia Mother   . Hypertension Mother   . Heart attack Father   . Diabetes Father     type 2  . Heart attack Brother   . Heart disease Brother 26    MI  . Hemophilia Son   . Hypertension Son   . Hemophilia Son     History   Social History  . Marital Status: Married    Spouse Name: N/A    Number of Children: N/A  . Years of Education: N/A   Occupational History  . Not on file.   Social History Main Topics  . Smoking status: Current Some Day Smoker  . Smokeless tobacco: Never Used  . Alcohol Use: No  . Drug Use: No  . Sexually Active: No   Other Topics  Concern  . Not on file   Social History Narrative  . No narrative on file    Current Outpatient Prescriptions on File Prior to Visit  Medication Sig Dispense Refill  . fish oil-omega-3 fatty acids 1000 MG capsule Take 2 g by mouth daily.        . propranolol (INDERAL) 40 MG tablet Take 1 tablet (40 mg total) by mouth 2 (two) times daily.  180 tablet  2  . cetirizine (ZYRTEC) 10 MG tablet Take 1 tablet (10 mg total) by mouth daily as needed for allergies (dermatitis).  30 tablet  3  . Cholecalciferol (D3-1000 PO) Take 1 tablet by mouth daily.        . simvastatin (ZOCOR) 10 MG tablet Take 1 tablet (10 mg total) by mouth at bedtime.  30 tablet  3  . triamcinolone cream (KENALOG) 0.1 % Apply topically 2 (two) times daily. Apply to neck as needed  45 g  1    Allergies  Allergen Reactions  . Sulfa Antibiotics     Review of Systems  Review of Systems  Constitutional: Negative for fever and malaise/fatigue.  HENT: Negative for congestion.   Eyes:  Negative for discharge.  Respiratory: Negative for shortness of breath.   Cardiovascular: Negative for chest pain, palpitations and leg swelling.  Gastrointestinal: Negative for nausea, abdominal pain and diarrhea.  Genitourinary: Negative for dysuria.  Musculoskeletal: Negative for falls.  Skin: Negative for rash.  Neurological: Negative for loss of consciousness and headaches.  Endo/Heme/Allergies: Negative for polydipsia.  Psychiatric/Behavioral: Negative for depression and suicidal ideas. The patient is not nervous/anxious and does not have insomnia.     Objective  BP 112/74  Pulse 78  Temp 97.7 F (36.5 C) (Temporal)  Ht 4' 11.25" (1.505 m)  Wt 132 lb (59.875 kg)  BMI 26.44 kg/m2  SpO2 98%  Physical Exam  Physical Exam  Constitutional: She is oriented to person, place, and time and well-developed, well-nourished, and in no distress. No distress.  HENT:  Head: Normocephalic and atraumatic.  Eyes: Conjunctivae normal are  normal.  Neck: Neck supple. No thyromegaly present.  Cardiovascular: Normal rate, regular rhythm and normal heart sounds.   No murmur heard. Pulmonary/Chest: Effort normal and breath sounds normal. She has no wheezes.  Abdominal: She exhibits no distension and no mass.  Musculoskeletal: She exhibits no edema.  Lymphadenopathy:    She has no cervical adenopathy.  Neurological: She is alert and oriented to person, place, and time.  Skin: Skin is warm and dry. No rash noted. She is not diaphoretic.  Psychiatric: Memory, affect and judgment normal.    Lab Results  Component Value Date   TSH 1.94 07/13/2012   Lab Results  Component Value Date   WBC 7.6 07/13/2012   HGB 13.5 07/13/2012   HCT 40.9 07/13/2012   MCV 93.5 07/13/2012   PLT 229.0 07/13/2012   Lab Results  Component Value Date   CREATININE 0.7 07/13/2012   BUN 12 07/13/2012   NA 137 07/13/2012   K 4.5 07/13/2012   CL 103 07/13/2012   CO2 30 07/13/2012   Lab Results  Component Value Date   ALT 20 07/13/2012   AST 24 07/13/2012   ALKPHOS 84 07/13/2012   BILITOT 0.6 07/13/2012   Lab Results  Component Value Date   CHOL 253* 07/13/2012   Lab Results  Component Value Date   HDL 59.90 07/13/2012   No results found for this basename: [facility]   Lab Results  Component Value Date   TRIG 190.0* 07/13/2012   Lab Results  Component Value Date   CHOLHDL 4 07/13/2012     Assessment & Plan  Tobacco abuse Quit for good, over the new year.   Hypertension Improved on recheck. Minimize sodium  Preventative health care Given flu shot today  Hyperlipidemia Agrees to initiation of Simvastatin 10 mg daily warned about possible side effects and advised to start CoQ 10. Recheck levels in 3 months  Hyperglycemia Hgba1c, and asked to consider any significant management diet size. Encouraged her to alter her diet to decrease carbohydrates and use complex carbs whenever possible. We will recheck hemoglobin A1c in 3 months time.

## 2012-07-18 NOTE — Assessment & Plan Note (Signed)
Given flu shot  today 

## 2012-07-18 NOTE — Assessment & Plan Note (Signed)
Agrees to initiation of Simvastatin 10 mg daily warned about possible side effects and advised to start CoQ 10. Recheck levels in 3 months

## 2012-07-18 NOTE — Assessment & Plan Note (Signed)
Improved on recheck. Minimize sodium

## 2012-07-20 ENCOUNTER — Other Ambulatory Visit: Payer: Self-pay

## 2012-07-20 ENCOUNTER — Other Ambulatory Visit: Payer: Self-pay | Admitting: Family Medicine

## 2012-07-20 DIAGNOSIS — I10 Essential (primary) hypertension: Secondary | ICD-10-CM

## 2012-07-20 MED ORDER — PROPRANOLOL HCL 40 MG PO TABS: 40.0000 mg | ORAL_TABLET | Freq: Two times a day (BID) | ORAL | Status: DC

## 2012-10-09 ENCOUNTER — Encounter: Payer: Self-pay | Admitting: Family Medicine

## 2012-10-09 ENCOUNTER — Telehealth: Payer: Self-pay | Admitting: Family Medicine

## 2012-10-09 ENCOUNTER — Ambulatory Visit (INDEPENDENT_AMBULATORY_CARE_PROVIDER_SITE_OTHER): Payer: Medicare Other | Admitting: Family Medicine

## 2012-10-09 VITALS — BP 122/78 | HR 72 | Temp 98.3°F | Ht 59.25 in | Wt 134.0 lb

## 2012-10-09 DIAGNOSIS — R739 Hyperglycemia, unspecified: Secondary | ICD-10-CM

## 2012-10-09 DIAGNOSIS — R7309 Other abnormal glucose: Secondary | ICD-10-CM

## 2012-10-09 DIAGNOSIS — I10 Essential (primary) hypertension: Secondary | ICD-10-CM

## 2012-10-09 DIAGNOSIS — E785 Hyperlipidemia, unspecified: Secondary | ICD-10-CM

## 2012-10-09 NOTE — Patient Instructions (Addendum)
4-6 weeks, lipid, renal, hgba1c for hyperlipidemia and hyperglycemia Labs prior to next visit with MD, lipid, renal, hepatic, hgba1c, cbc, tsh     Cholesterol Cholesterol is a white, waxy, fat-like protein needed by your body in small amounts. The liver makes all the cholesterol you need. It is carried from the liver by the blood through the blood vessels. Deposits (plaque) may build up on blood vessel walls. This makes the arteries narrower and stiffer. Plaque increases the risk for heart attack and stroke. You cannot feel your cholesterol level even if it is very high. The only way to know is by a blood test to check your lipid (fats) levels. Once you know your cholesterol levels, you should keep a record of the test results. Work with your caregiver to to keep your levels in the desired range. WHAT THE RESULTS MEAN:  Total cholesterol is a rough measure of all the cholesterol in your blood.  LDL is the so-called bad cholesterol. This is the type that deposits cholesterol in the walls of the arteries. You want this level to be low.  HDL is the good cholesterol because it cleans the arteries and carries the LDL away. You want this level to be high.  Triglycerides are fat that the body can either burn for energy or store. High levels are closely linked to heart disease. DESIRED LEVELS:  Total cholesterol below 200.  LDL below 100 for people at risk, below 70 for very high risk.  HDL above 50 is good, above 60 is best.  Triglycerides below 150. HOW TO LOWER YOUR CHOLESTEROL:  Diet.  Choose fish or white meat chicken and [place], roasted or baked. Limit fatty cuts of red meat, fried foods, and processed meats, such as sausage and lunch meat.  Eat lots of fresh fruits and vegetables. Choose whole grains, beans, pasta, potatoes and cereals.  Use only small amounts of olive, corn or canola oils. Avoid butter, mayonnaise, shortening or palm kernel oils. Avoid foods with trans-fats.  Use  skim/nonfat milk and low-fat/nonfat yogurt and cheeses. Avoid whole milk, cream, ice cream, egg yolks and cheeses. Healthy desserts include angel food cake, ginger snaps, animal crackers, hard candy, popsicles, and low-fat/nonfat frozen yogurt. Avoid pastries, cakes, pies and cookies.  Exercise.  A regular program helps decrease LDL and raises HDL.  Helps with weight control.  Do things that increase your activity level like gardening, walking, or taking the stairs.  Medication.  May be prescribed by your caregiver to help lowering cholesterol and the risk for heart disease.  You may need medicine even if your levels are normal if you have several risk factors. HOME CARE INSTRUCTIONS   Follow your diet and exercise programs as suggested by your caregiver.  Take medications as directed.  Have blood work done when your caregiver feels it is necessary. MAKE SURE YOU:   Understand these instructions.  Will watch your condition.  Will get help right away if you are not doing well or get worse. Document Released: 03/15/2001 Document Revised: 09/12/2011 Document Reviewed: 09/05/2007 [facility] Patient Information 2013 [place], [place].

## 2012-10-09 NOTE — Assessment & Plan Note (Addendum)
Avoid trans fats, consider starting a krill oil caps daily and increase exercise, recheck levels

## 2012-10-09 NOTE — Assessment & Plan Note (Signed)
Avoid simple carbs, consider DASH diet.

## 2012-10-09 NOTE — Telephone Encounter (Signed)
4-6 weeks, lipid, renal, hgba1c for hyperlipidemia and hyperglycemia  Labs prior to next visit with MD, lipid, renal, hepatic, hgba1c, cbc, tsh  Patient has upcoming appointment for 02/11/13. She will be going to [facility] lab

## 2012-10-09 NOTE — Assessment & Plan Note (Signed)
Well controlled today, no changes to current meds.

## 2012-10-09 NOTE — Progress Notes (Signed)
Patient ID: Natalie Swanson, female   DOB: Nov 19, 1935, 77 y.o.   MRN: [id number] Natalie Swanson [id number] [birth year] 10/09/2012      Progress Note-Follow Up  Subjective  Chief Complaint  Chief Complaint  Patient presents with  . Follow-up    4 month    HPI  Patient is a 77 year old Caucasian female who is in today for followup. Overall feels well. No recent illness. No complaints of chest pain or palpitations. No fatigue or congestion. Skin is doing well. No shortness of breath GI or GU complaints noted at bedtime. She's taking her meds as prescribed and is tolerating her simvastatin   Past Medical History  Diagnosis Date  . Shingles 9-12  . Chicken pox as a child  . Flashers or floaters, right eye 9-12    floaters  . Mumps as a child  . Measles as a child  . Cataract 07-04-2006    b/l  . Hypertension   . Hyperlipidemia   . Atopic dermatitis 01/11/2012  . Tobacco abuse 01/11/2012  . Hyperglycemia 07/18/2012    Past Surgical History  Procedure Laterality Date  . Cataracts removed  07-04-2006  . Cesarean section  1981    X 1    Family History  Problem Relation Age of Onset  . Hyperlipidemia Mother   . Hypertension Mother   . Heart attack Father   . Diabetes Father     type 2  . Heart attack Brother   . Heart disease Brother 52    MI  . Hemophilia Son   . Hypertension Son   . Hemophilia Son     History   Social History  . Marital Status: Married    Spouse Name: N/A    Number of Children: N/A  . Years of Education: N/A   Occupational History  . Not on file.   Social History Main Topics  . Smoking status: Former Smoker -- 0.20 packs/day for 61 years    Types: Cigarettes    Start date: 07/04/2012  . Smokeless tobacco: Never Used  . Alcohol Use: No  . Drug Use: No  . Sexually Active: No   Other Topics Concern  . Not on file   Social History Narrative  . No narrative on file    Current Outpatient Prescriptions on File Prior to Visit  Medication  Sig Dispense Refill  . cetirizine (ZYRTEC) 10 MG tablet Take 1 tablet (10 mg total) by mouth daily as needed for allergies (dermatitis).  30 tablet  3  . Cholecalciferol (D3-1000 PO) Take 1 tablet by mouth daily.        . fish oil-omega-3 fatty acids 1000 MG capsule Take 2 g by mouth daily.        . propranolol (INDERAL) 40 MG tablet Take 1 tablet (40 mg total) by mouth 2 (two) times daily.  180 tablet  2  . simvastatin (ZOCOR) 10 MG tablet Take 1 tablet (10 mg total) by mouth at bedtime.  30 tablet  3  . triamcinolone cream (KENALOG) 0.1 % Apply topically 2 (two) times daily. Apply to neck as needed  45 g  1   No current facility-administered medications on file prior to visit.    Allergies  Allergen Reactions  . Sulfa Antibiotics     Review of Systems  Review of Systems  Constitutional: Negative for fever and malaise/fatigue.  HENT: Negative for congestion.   Eyes: Negative for discharge.  Respiratory: Negative for shortness of breath.  Cardiovascular: Negative for chest pain, palpitations and leg swelling.  Gastrointestinal: Negative for nausea, abdominal pain and diarrhea.  Genitourinary: Negative for dysuria.  Musculoskeletal: Negative for falls.  Skin: Negative for rash.  Neurological: Negative for loss of consciousness and headaches.  Endo/Heme/Allergies: Negative for polydipsia.  Psychiatric/Behavioral: Negative for depression and suicidal ideas. The patient is not nervous/anxious and does not have insomnia.     Objective  BP 122/78  Pulse 72  Temp(Src) 98.3 F (36.8 C) (Oral)  Ht 4' 11.25" (1.505 m)  Wt 134 lb (60.782 kg)  BMI 26.84 kg/m2  SpO2 97%  Physical Exam  Physical Exam  Constitutional: She is oriented to person, place, and time and well-developed, well-nourished, and in no distress. No distress.  HENT:  Head: Normocephalic and atraumatic.  Eyes: Conjunctivae are normal.  Neck: Neck supple. No thyromegaly present.  Cardiovascular: Normal rate,  regular rhythm and normal heart sounds.   No murmur heard. Pulmonary/Chest: Effort normal and breath sounds normal. She has no wheezes.  Abdominal: She exhibits no distension and no mass.  Musculoskeletal: She exhibits no edema.  Lymphadenopathy:    She has no cervical adenopathy.  Neurological: She is alert and oriented to person, place, and time.  Skin: Skin is warm and dry. No rash noted. She is not diaphoretic.  Psychiatric: Memory, affect and judgment normal.    Lab Results  Component Value Date   TSH 1.94 07/13/2012   Lab Results  Component Value Date   WBC 7.6 07/13/2012   HGB 13.5 07/13/2012   HCT 40.9 07/13/2012   MCV 93.5 07/13/2012   PLT 229.0 07/13/2012   Lab Results  Component Value Date   CREATININE 0.7 07/13/2012   BUN 12 07/13/2012   NA 137 07/13/2012   K 4.5 07/13/2012   CL 103 07/13/2012   CO2 30 07/13/2012   Lab Results  Component Value Date   ALT 20 07/13/2012   AST 24 07/13/2012   ALKPHOS 84 07/13/2012   BILITOT 0.6 07/13/2012   Lab Results  Component Value Date   CHOL 253* 07/13/2012   Lab Results  Component Value Date   HDL 59.90 07/13/2012   No results found for this basename: [facility]   Lab Results  Component Value Date   TRIG 190.0* 07/13/2012   Lab Results  Component Value Date   CHOLHDL 4 07/13/2012     Assessment & Plan  Hypertension Well controlled today, no changes to current meds.  Hyperlipidemia Avoid trans fats, consider starting a krill oil caps daily and increase exercise, recheck levels  Hyperglycemia Avoid simple carbs, consider DASH diet.

## 2012-10-15 ENCOUNTER — Other Ambulatory Visit: Payer: Self-pay | Admitting: Family Medicine

## 2012-10-16 ENCOUNTER — Ambulatory Visit: Payer: Medicare Other | Admitting: Family Medicine

## 2012-12-02 ENCOUNTER — Other Ambulatory Visit: Payer: Self-pay | Admitting: Family Medicine

## 2012-12-03 ENCOUNTER — Other Ambulatory Visit: Payer: Self-pay | Admitting: Family Medicine

## 2013-01-02 LAB — PHOSPHORUS: Phosphorus: 3.3 mg/dL (ref 2.3–4.6)

## 2013-01-02 LAB — LIPID PANEL
Cholesterol: 181 mg/dL (ref 0–200)
HDL: 61 mg/dL (ref >39)
LDL Cholesterol: 90 mg/dL (ref 0–99)
Total CHOL/HDL Ratio: 3 Ratio
Triglycerides: 152 mg/dL — ABNORMAL HIGH (ref <150)
VLDL: 30 mg/dL (ref 0–40)

## 2013-01-02 LAB — HEPATIC FUNCTION PANEL
ALT: 14 U/L (ref 0–35)
AST: 20 U/L (ref 0–37)
Albumin: 4.5 g/dL (ref 3.5–5.2)
Alkaline Phosphatase: 79 U/L (ref 39–117)
Bilirubin, Direct: 0.1 mg/dL (ref 0.0–0.3)
Indirect Bilirubin: 0.4 mg/dL (ref 0.0–0.9)
Total Bilirubin: 0.5 mg/dL (ref 0.3–1.2)
Total Protein: 6.9 g/dL (ref 6.0–8.3)

## 2013-01-02 LAB — BASIC METABOLIC PANEL
BUN: 9 mg/dL (ref 6–23)
CO2: 28 mEq/L (ref 19–32)
Calcium: 9.8 mg/dL (ref 8.4–10.5)
Chloride: 104 mEq/L (ref 96–112)
Creat: 0.84 mg/dL (ref 0.50–1.10)
Glucose, Bld: 95 mg/dL (ref 70–99)
Potassium: 4.6 mEq/L (ref 3.5–5.3)
Sodium: 140 mEq/L (ref 135–145)

## 2013-01-02 LAB — TSH: TSH: 2.202 u[IU]/mL (ref 0.350–4.500)

## 2013-01-03 LAB — CBC
HCT: 38 % (ref 36.0–46.0)
Hemoglobin: 13.3 g/dL (ref 12.0–15.0)
MCH: 31.6 pg (ref 26.0–34.0)
MCHC: 35 g/dL (ref 30.0–36.0)
MCV: 90.3 fL (ref 78.0–100.0)
Platelets: 219 10*3/uL (ref 150–400)
RBC: 4.21 MIL/uL (ref 3.87–5.11)
RDW: 14.4 % (ref 11.5–15.5)
WBC: 7.1 10*3/uL (ref 4.0–10.5)

## 2013-01-03 LAB — HEMOGLOBIN A1C
Hgb A1c MFr Bld: 5.9 % — ABNORMAL HIGH (ref <5.7)
Mean Plasma Glucose: 123 mg/dL — ABNORMAL HIGH (ref <117)

## 2013-02-11 ENCOUNTER — Ambulatory Visit: Payer: Medicare Other | Admitting: Family Medicine

## 2013-03-06 ENCOUNTER — Telehealth: Payer: Self-pay

## 2013-03-06 DIAGNOSIS — Z0000 Encounter for general adult medical examination without abnormal findings: Secondary | ICD-10-CM

## 2013-03-06 DIAGNOSIS — I10 Essential (primary) hypertension: Secondary | ICD-10-CM

## 2013-03-06 DIAGNOSIS — E785 Hyperlipidemia, unspecified: Secondary | ICD-10-CM

## 2013-03-06 DIAGNOSIS — R739 Hyperglycemia, unspecified: Secondary | ICD-10-CM

## 2013-03-06 NOTE — Telephone Encounter (Signed)
Labs hepatic, renal, cbc, tsh, hgba1c, lipid

## 2013-03-06 NOTE — Telephone Encounter (Signed)
Patient came in for an office visit in April and was asked to return for labs in 4-6 weeks for Lipid, renal, and A1C. And to come back for labs before next appt and have lipid, renal, A1C, hepatic, cbc and tsh done.  Pt came in on 01-02-13 and had the labs done that were supposed to be done this month.  So do you want Katrina to do Lipid, renal and A1C? Isn't all this to close together for insurance?  Please advise?

## 2013-03-07 LAB — HEMOGLOBIN A1C
Hgb A1c MFr Bld: 6.2 % — ABNORMAL HIGH (ref <5.7)
Mean Plasma Glucose: 131 mg/dL — ABNORMAL HIGH (ref <117)

## 2013-03-07 LAB — CBC
HCT: 38.8 % (ref 36.0–46.0)
Hemoglobin: 13 g/dL (ref 12.0–15.0)
MCH: 31 pg (ref 26.0–34.0)
MCHC: 33.5 g/dL (ref 30.0–36.0)
MCV: 92.6 fL (ref 78.0–100.0)
Platelets: 230 10*3/uL (ref 150–400)
RBC: 4.19 MIL/uL (ref 3.87–5.11)
RDW: 14.1 % (ref 11.5–15.5)
WBC: 5.9 10*3/uL (ref 4.0–10.5)

## 2013-03-07 LAB — HEPATIC FUNCTION PANEL
ALT: 14 U/L (ref 0–35)
AST: 20 U/L (ref 0–37)
Albumin: 4.1 g/dL (ref 3.5–5.2)
Alkaline Phosphatase: 68 U/L (ref 39–117)
Bilirubin, Direct: 0.1 mg/dL (ref 0.0–0.3)
Indirect Bilirubin: 0.4 mg/dL (ref 0.0–0.9)
Total Bilirubin: 0.5 mg/dL (ref 0.3–1.2)
Total Protein: 6.7 g/dL (ref 6.0–8.3)

## 2013-03-07 LAB — RENAL FUNCTION PANEL
Albumin: 4.1 g/dL (ref 3.5–5.2)
BUN: 11 mg/dL (ref 6–23)
CO2: 27 mEq/L (ref 19–32)
Calcium: 9.2 mg/dL (ref 8.4–10.5)
Chloride: 103 mEq/L (ref 96–112)
Creat: 0.81 mg/dL (ref 0.50–1.10)
Glucose, Bld: 89 mg/dL (ref 70–99)
Phosphorus: 3.1 mg/dL (ref 2.3–4.6)
Potassium: 4.4 mEq/L (ref 3.5–5.3)
Sodium: 139 mEq/L (ref 135–145)

## 2013-03-07 LAB — LIPID PANEL
Cholesterol: 176 mg/dL (ref 0–200)
HDL: 47 mg/dL (ref >39)
LDL Cholesterol: 87 mg/dL (ref 0–99)
Total CHOL/HDL Ratio: 3.7 Ratio
Triglycerides: 211 mg/dL — ABNORMAL HIGH (ref <150)
VLDL: 42 mg/dL — ABNORMAL HIGH (ref 0–40)

## 2013-03-07 LAB — TSH: TSH: 2.787 u[IU]/mL (ref 0.350–4.500)

## 2013-03-07 NOTE — Telephone Encounter (Signed)
Lab order placed.

## 2013-03-13 ENCOUNTER — Telehealth: Payer: Self-pay | Admitting: Family Medicine

## 2013-03-13 ENCOUNTER — Ambulatory Visit (INDEPENDENT_AMBULATORY_CARE_PROVIDER_SITE_OTHER): Payer: Medicare Other | Admitting: Family Medicine

## 2013-03-13 ENCOUNTER — Encounter: Payer: Self-pay | Admitting: Family Medicine

## 2013-03-13 VITALS — BP 110/82 | HR 65 | Temp 98.2°F | Ht 59.25 in | Wt 134.0 lb

## 2013-03-13 DIAGNOSIS — Z23 Encounter for immunization: Secondary | ICD-10-CM

## 2013-03-13 DIAGNOSIS — R739 Hyperglycemia, unspecified: Secondary | ICD-10-CM

## 2013-03-13 DIAGNOSIS — E785 Hyperlipidemia, unspecified: Secondary | ICD-10-CM

## 2013-03-13 DIAGNOSIS — I10 Essential (primary) hypertension: Secondary | ICD-10-CM

## 2013-03-13 DIAGNOSIS — F17200 Nicotine dependence, unspecified, uncomplicated: Secondary | ICD-10-CM

## 2013-03-13 DIAGNOSIS — R7309 Other abnormal glucose: Secondary | ICD-10-CM

## 2013-03-13 DIAGNOSIS — Z720 Tobacco use: Secondary | ICD-10-CM

## 2013-03-13 MED ORDER — SIMVASTATIN 20 MG PO TABS: 20.0000 mg | ORAL_TABLET | Freq: Every day | ORAL | Status: DC

## 2013-03-13 NOTE — Telephone Encounter (Signed)
Lab order week of 06-03-2013 Check out comments: Labs prior to next visit lipid, renal, cbc, tsh, hepatic, hgba1c

## 2013-03-13 NOTE — Patient Instructions (Addendum)
Start a krill oil cap daily   Basic Carbohydrate Counting Basic carbohydrate counting is a way to plan meals. It is done by counting the amount of carbohydrate in foods. Foods that have carbohydrates are starches (grains, beans, starchy vegetables) and sweets. Eating carbohydrates increases blood glucose (sugar) levels. People with diabetes use carbohydrate counting to help keep their blood glucose at a normal level.  COUNTING CARBOHYDRATES IN FOODS The first step in counting carbohydrates is to learn how many carbohydrate servings you should have in every meal. A dietitian can plan this for you. After learning the amount of carbohydrates to include in your meal plan, you can start to choose the carbohydrate-containing foods you want to eat.  There are 2 ways to identify the amount of carbohydrates in the foods you eat.  Read the Nutrition Facts panel on food labels. You need 2 pieces of information from the Nutrition Facts panel to count carbohydrates this way:  Serving size.  Total carbohydrate (in grams). Decide how many servings you will be eating. If it is 1 serving, you will be eating the amount of carbohydrate listed on the panel. If you will be eating 2 servings, you will be eating double the amount of carbohydrate listed on the panel.   Learn serving sizes. A serving size of most carbohydrate-containing foods is about 15 grams (g). Listed below are single serving sizes of common carbohydrate-containing foods:  1 slice bread.   cup unsweetened, dry cereal.   cup hot cereal.   cup rice.   cup mashed potatoes.   cup pasta.  1 cup fresh fruit.   cup canned fruit.  1 cup milk (whole, 2%, or skim).   cup starchy vegetables (peas, corn, or potatoes). Counting carbohydrates this way is similar to looking on the Nutrition Facts panel. Decide how many servings you will eat first. Multiply the number of servings you eat by 15 g. For example, if you have 2 cups of strawberries,  you had 2 servings. That means you had 30 g of carbohydrate (2 servings x 15 g = 30 g). CALCULATING CARBOHYDRATES IN A MEAL Sample dinner  3 oz chicken breast.   cup brown rice.   cup corn.  1 cup fat-free milk.  1 cup strawberries with sugar-free whipped topping. Carbohydrate calculation First, identify the foods that contain carbohydrate:  Rice.  Corn.  Milk.  Strawberries. Calculate the number of servings eaten:  2 servings rice.  1 serving corn.  1 serving milk.  1 serving strawberries. Multiply the number of servings by 15 g:  2 servings rice x 15 g = 30 g.  1 serving corn x 15 g = 15 g.  1 serving milk x 15 g = 15 g.  1 serving strawberries x 15 g = 15 g. Add the amounts to find the total carbohydrates eaten: 30 g + 15 g + 15 g + 15 g = 75 g carbohydrate eaten at dinner. Document Released: 06/20/2005 Document Revised: 09/12/2011 Document Reviewed: 05/06/2011 [facility] Patient Information 2014 [place], [place].

## 2013-03-17 ENCOUNTER — Encounter: Payer: Self-pay | Admitting: Family Medicine

## 2013-03-17 NOTE — Assessment & Plan Note (Signed)
hgba1c trending up to 6.2, reinforced need to minimize simple carbs, one per meal, small and frequent meals with protein is necessary. Continue to monitor.

## 2013-03-17 NOTE — Assessment & Plan Note (Signed)
Increase Simvastatin. To 20 mg daily, avoid trans fats

## 2013-03-17 NOTE — Progress Notes (Signed)
Patient ID: Natalie Swanson, female   DOB: 1936-01-29, 77 y.o.   MRN: [id number] Natalie Swanson [id number] [birth year] 03/17/2013      Progress Note-Follow Up  Subjective  Chief Complaint   Chief Complaint  Patient presents with  . Follow-up    5 month  . Injections    flu    HPI  Patient is a 77 year old Caucasian female who is in today for followup. Overall she's doing well but she does acknowledge a couple weeks worth of intermittent episodes of feeling sweaty and slightly shaky. No syncope. No falls. No fevers, headaches, congestion, chest pain, palpitations, shortness or breath, GI or GU concerns. Is taking medications as prescribed.  Past Medical History  Diagnosis Date  . Shingles 9-12  . Chicken pox as a child  . Flashers or floaters, right eye 9-12    floaters  . Mumps as a child  . Measles as a child  . Cataract 07-04-2006    b/l  . Hypertension   . Hyperlipidemia   . Atopic dermatitis 01/11/2012  . Tobacco abuse 01/11/2012  . Hyperglycemia 07/18/2012    Past Surgical History  Procedure Laterality Date  . Cataracts removed  07-04-2006  . Cesarean section  1981    X 1    Family History  Problem Relation Age of Onset  . Hyperlipidemia Mother   . Hypertension Mother   . Heart attack Father   . Diabetes Father     type 2  . Heart attack Brother   . Heart disease Brother 5    MI  . Hemophilia Son   . Hypertension Son   . Hemophilia Son     History   Social History  . Marital Status: Married    Spouse Name: N/A    Number of Children: N/A  . Years of Education: N/A   Occupational History  . Not on file.   Social History Main Topics  . Smoking status: Former Smoker -- 0.20 packs/day for 61 years    Types: Cigarettes    Start date: 07/04/2012  . Smokeless tobacco: Never Used  . Alcohol Use: No  . Drug Use: No  . Sexual Activity: No   Other Topics Concern  . Not on file   Social History Narrative  . No narrative on file    Current  Outpatient Prescriptions on File Prior to Visit  Medication Sig Dispense Refill  . cetirizine (ZYRTEC) 10 MG tablet Take 1 tablet (10 mg total) by mouth daily as needed for allergies (dermatitis).  30 tablet  3  . Cholecalciferol (D3-1000 PO) Take 1 tablet by mouth daily.        . fish oil-omega-3 fatty acids 1000 MG capsule Take 2 g by mouth daily.        . propranolol (INDERAL) 40 MG tablet Take 1 tablet (40 mg total) by mouth 2 (two) times daily.  180 tablet  2   No current facility-administered medications on file prior to visit.    Allergies  Allergen Reactions  . Sulfa Antibiotics     Review of Systems  Review of Systems  Constitutional: Negative for fever and malaise/fatigue.  HENT: Negative for congestion.   Eyes: Negative for discharge.  Respiratory: Negative for shortness of breath.   Cardiovascular: Negative for chest pain, palpitations and leg swelling.  Gastrointestinal: Negative for nausea, abdominal pain and diarrhea.  Genitourinary: Negative for dysuria.  Musculoskeletal: Negative for falls.  Skin: Negative for rash.  Neurological: Negative for  loss of consciousness and headaches.  Endo/Heme/Allergies: Negative for polydipsia.  Psychiatric/Behavioral: Negative for depression and suicidal ideas. The patient is not nervous/anxious and does not have insomnia.     Objective  BP 110/82  Pulse 65  Temp(Src) 98.2 F (36.8 C) (Oral)  Ht 4' 11.25" (1.505 m)  Wt 134 lb (60.782 kg)  BMI 26.84 kg/m2  SpO2 96%  Physical Exam  Physical Exam  Constitutional: She is oriented to person, place, and time and well-developed, well-nourished, and in no distress. No distress.  HENT:  Head: Normocephalic and atraumatic.  Eyes: Conjunctivae are normal.  Neck: Neck supple. No thyromegaly present.  Cardiovascular: Normal rate, regular rhythm and normal heart sounds.   No murmur heard. Pulmonary/Chest: Effort normal and breath sounds normal. She has no wheezes.  Abdominal:  She exhibits no distension and no mass.  Musculoskeletal: She exhibits no edema.  Lymphadenopathy:    She has no cervical adenopathy.  Neurological: She is alert and oriented to person, place, and time.  Skin: Skin is warm and dry. No rash noted. She is not diaphoretic.  Psychiatric: Memory, affect and judgment normal.    Lab Results  Component Value Date   TSH 2.787 03/07/2013   Lab Results  Component Value Date   WBC 5.9 03/07/2013   HGB 13.0 03/07/2013   HCT 38.8 03/07/2013   MCV 92.6 03/07/2013   PLT 230 03/07/2013   Lab Results  Component Value Date   CREATININE 0.81 03/07/2013   BUN 11 03/07/2013   NA 139 03/07/2013   K 4.4 03/07/2013   CL 103 03/07/2013   CO2 27 03/07/2013   Lab Results  Component Value Date   ALT 14 03/07/2013   AST 20 03/07/2013   ALKPHOS 68 03/07/2013   BILITOT 0.5 03/07/2013   Lab Results  Component Value Date   CHOL 176 03/07/2013   Lab Results  Component Value Date   HDL 47 03/07/2013   Lab Results  Component Value Date   LDLCALC 87 03/07/2013   Lab Results  Component Value Date   TRIG 211* 03/07/2013   Lab Results  Component Value Date   CHOLHDL 3.7 03/07/2013     Assessment & Plan  Tobacco abuse Unfortunately continues to smoke encouraged complete cessation to diminish the risk of many serious illnesses.  Hyperglycemia hgba1c trending up to 6.2, reinforced need to minimize simple carbs, one per meal, small and frequent meals with protein is necessary. Continue to monitor.  Hyperlipidemia Increase Simvastatin. To 20 mg daily, avoid trans fats  Hypertension Well controlled no changes.

## 2013-03-17 NOTE — Assessment & Plan Note (Signed)
Well controlled no changes 

## 2013-03-17 NOTE — Assessment & Plan Note (Signed)
Unfortunately continues to smoke encouraged complete cessation to diminish the risk of many serious illnesses.

## 2013-06-11 ENCOUNTER — Ambulatory Visit: Payer: Medicare Other | Admitting: Family Medicine

## 2013-07-11 ENCOUNTER — Ambulatory Visit: Payer: Medicare Other | Admitting: Family Medicine

## 2013-07-22 ENCOUNTER — Ambulatory Visit: Payer: Medicare Other | Admitting: Family Medicine

## 2013-07-24 LAB — CBC
HCT: 40.9 % (ref 36.0–46.0)
Hemoglobin: 13.7 g/dL (ref 12.0–15.0)
MCH: 31.1 pg (ref 26.0–34.0)
MCHC: 33.5 g/dL (ref 30.0–36.0)
MCV: 93 fL (ref 78.0–100.0)
Platelets: 237 10*3/uL (ref 150–400)
RBC: 4.4 MIL/uL (ref 3.87–5.11)
RDW: 13.9 % (ref 11.5–15.5)
WBC: 7.9 10*3/uL (ref 4.0–10.5)

## 2013-07-24 LAB — HEMOGLOBIN A1C
Hgb A1c MFr Bld: 5.9 % — ABNORMAL HIGH (ref <5.7)
Mean Plasma Glucose: 123 mg/dL — ABNORMAL HIGH (ref <117)

## 2013-07-25 LAB — LIPID PANEL
Cholesterol: 181 mg/dL (ref 0–200)
HDL: 62 mg/dL (ref >39)
LDL Cholesterol: 77 mg/dL (ref 0–99)
Total CHOL/HDL Ratio: 2.9 Ratio
Triglycerides: 211 mg/dL — ABNORMAL HIGH (ref <150)
VLDL: 42 mg/dL — ABNORMAL HIGH (ref 0–40)

## 2013-07-25 LAB — RENAL FUNCTION PANEL
Albumin: 4.4 g/dL (ref 3.5–5.2)
BUN: 13 mg/dL (ref 6–23)
CO2: 27 mEq/L (ref 19–32)
Calcium: 9.5 mg/dL (ref 8.4–10.5)
Chloride: 104 mEq/L (ref 96–112)
Creat: 0.74 mg/dL (ref 0.50–1.10)
Glucose, Bld: 97 mg/dL (ref 70–99)
Phosphorus: 3.5 mg/dL (ref 2.3–4.6)
Potassium: 4.5 mEq/L (ref 3.5–5.3)
Sodium: 138 mEq/L (ref 135–145)

## 2013-07-25 LAB — HEPATIC FUNCTION PANEL
ALT: 19 U/L (ref 0–35)
AST: 21 U/L (ref 0–37)
Albumin: 4.4 g/dL (ref 3.5–5.2)
Alkaline Phosphatase: 75 U/L (ref 39–117)
Bilirubin, Direct: 0.1 mg/dL (ref 0.0–0.3)
Indirect Bilirubin: 0.4 mg/dL (ref 0.0–0.9)
Total Bilirubin: 0.5 mg/dL (ref 0.3–1.2)
Total Protein: 6.8 g/dL (ref 6.0–8.3)

## 2013-07-25 LAB — TSH: TSH: 1.376 u[IU]/mL (ref 0.350–4.500)

## 2013-07-26 ENCOUNTER — Encounter: Payer: Self-pay | Admitting: Family Medicine

## 2013-07-26 ENCOUNTER — Ambulatory Visit (INDEPENDENT_AMBULATORY_CARE_PROVIDER_SITE_OTHER): Payer: Medicare Other | Admitting: Family Medicine

## 2013-07-26 VITALS — BP 118/84 | HR 72 | Temp 98.3°F | Ht 59.25 in | Wt 133.1 lb

## 2013-07-26 DIAGNOSIS — I10 Essential (primary) hypertension: Secondary | ICD-10-CM

## 2013-07-26 DIAGNOSIS — R739 Hyperglycemia, unspecified: Secondary | ICD-10-CM

## 2013-07-26 DIAGNOSIS — Z23 Encounter for immunization: Secondary | ICD-10-CM

## 2013-07-26 DIAGNOSIS — E785 Hyperlipidemia, unspecified: Secondary | ICD-10-CM

## 2013-07-26 DIAGNOSIS — F17200 Nicotine dependence, unspecified, uncomplicated: Secondary | ICD-10-CM

## 2013-07-26 DIAGNOSIS — R7309 Other abnormal glucose: Secondary | ICD-10-CM

## 2013-07-26 DIAGNOSIS — Z720 Tobacco use: Secondary | ICD-10-CM

## 2013-07-26 NOTE — Progress Notes (Signed)
Pre visit review using our clinic review tool, if applicable. No additional management support is needed unless otherwise documented below in the visit note. 

## 2013-07-26 NOTE — Patient Instructions (Signed)

## 2013-07-26 NOTE — Progress Notes (Signed)
Patient ID: Natalie Swanson, female   DOB: 30-May-1936, 78 y.o.   MRN: [id number] Natalie Swanson [id number] 07/02/1936 07/26/2013      Progress Note-Follow Up  Subjective  Chief Complaint  Chief Complaint  Patient presents with  . Follow-up    3 month  . Injections    prevnar    HPI  Patient is a 78 year old female in today for followup. Has been struggling with some left wrist pain and but denies any redness or warmth. No recent falls or injury. No headache, chest pain, fevers, palpitations, shortness of breath, GI or GU concerns. Taking medications as prescribed. Increase to Prevnar today.  Past Medical History  Diagnosis Date  . Shingles 9-12  . Chicken pox as a child  . Flashers or floaters, right eye 9-12    floaters  . Mumps as a child  . Measles as a child  . Cataract 07-04-2006    b/l  . Hypertension   . Hyperlipidemia   . Atopic dermatitis 01/11/2012  . Tobacco abuse 01/11/2012  . Hyperglycemia 07/18/2012    Past Surgical History  Procedure Laterality Date  . Cataracts removed  07-04-2006  . Cesarean section  1981    X 1    Family History  Problem Relation Age of Onset  . Hyperlipidemia Mother   . Hypertension Mother   . Heart attack Father   . Diabetes Father     type 2  . Heart attack Brother   . Heart disease Brother 15    MI  . Hemophilia Son   . Hypertension Son   . Hemophilia Son     History   Social History  . Marital Status: Married    Spouse Name: N/A    Number of Children: N/A  . Years of Education: N/A   Occupational History  . Not on file.   Social History Main Topics  . Smoking status: Former Smoker -- 0.20 packs/day for 61 years    Types: Cigarettes    Start date: 07/04/2012  . Smokeless tobacco: Never Used  . Alcohol Use: No  . Drug Use: No  . Sexual Activity: No   Other Topics Concern  . Not on file   Social History Narrative  . No narrative on file    Current Outpatient Prescriptions on File Prior to Visit   Medication Sig Dispense Refill  . cetirizine (ZYRTEC) 10 MG tablet Take 1 tablet (10 mg total) by mouth daily as needed for allergies (dermatitis).  30 tablet  3  . Cholecalciferol (D3-1000 PO) Take 1 tablet by mouth daily.        . fish oil-omega-3 fatty acids 1000 MG capsule Take 2 g by mouth daily.        . propranolol (INDERAL) 40 MG tablet Take 1 tablet (40 mg total) by mouth 2 (two) times daily.  180 tablet  2  . simvastatin (ZOCOR) 20 MG tablet Take 1 tablet (20 mg total) by mouth at bedtime.  90 tablet  1   No current facility-administered medications on file prior to visit.    Allergies  Allergen Reactions  . Sulfa Antibiotics     Review of Systems  Review of Systems  Constitutional: Negative for fever and malaise/fatigue.  HENT: Negative for congestion.   Eyes: Negative for discharge.  Respiratory: Negative for shortness of breath.   Cardiovascular: Negative for chest pain, palpitations and leg swelling.  Gastrointestinal: Negative for nausea, abdominal pain and diarrhea.  Genitourinary: Negative for  dysuria.  Musculoskeletal: Positive for joint pain. Negative for falls.       Left wrist  Skin: Negative for rash.  Neurological: Negative for loss of consciousness and headaches.  Endo/Heme/Allergies: Negative for polydipsia.  Psychiatric/Behavioral: Negative for depression and suicidal ideas. The patient is not nervous/anxious and does not have insomnia.     Objective  BP 118/84  Pulse 72  Temp(Src) 98.3 F (36.8 C) (Oral)  Ht 4' 11.25" (1.505 m)  Wt 133 lb 1.9 oz (60.383 kg)  BMI 26.66 kg/m2  SpO2 95%  Physical Exam  Physical Exam  Constitutional: She is oriented to person, place, and time and well-developed, well-nourished, and in no distress. No distress.  HENT:  Head: Normocephalic and atraumatic.  Eyes: Conjunctivae are normal.  Neck: Neck supple. No thyromegaly present.  Cardiovascular: Normal rate, regular rhythm and normal heart sounds.   No  murmur heard. Pulmonary/Chest: Effort normal and breath sounds normal. She has no wheezes.  Abdominal: She exhibits no distension and no mass.  Musculoskeletal: She exhibits no edema.  Lymphadenopathy:    She has no cervical adenopathy.  Neurological: She is alert and oriented to person, place, and time.  Skin: Skin is warm and dry. No rash noted. She is not diaphoretic.  Psychiatric: Memory, affect and judgment normal.    Lab Results  Component Value Date   TSH 1.376 07/24/2013   Lab Results  Component Value Date   WBC 7.9 07/24/2013   HGB 13.7 07/24/2013   HCT 40.9 07/24/2013   MCV 93.0 07/24/2013   PLT 237 07/24/2013   Lab Results  Component Value Date   CREATININE 0.74 07/24/2013   BUN 13 07/24/2013   NA 138 07/24/2013   K 4.5 07/24/2013   CL 104 07/24/2013   CO2 27 07/24/2013   Lab Results  Component Value Date   ALT 19 07/24/2013   AST 21 07/24/2013   ALKPHOS 75 07/24/2013   BILITOT 0.5 07/24/2013   Lab Results  Component Value Date   CHOL 181 07/24/2013   Lab Results  Component Value Date   HDL 62 07/24/2013   Lab Results  Component Value Date   LDLCALC 77 07/24/2013   Lab Results  Component Value Date   TRIG 211* 07/24/2013   Lab Results  Component Value Date   CHOLHDL 2.9 07/24/2013     Assessment & Plan  Hypertension Well controlled, no changes  Hyperglycemia hgba1c 5.9, improved, minimize simple carbs.  Hyperlipidemia Avoid trans fats maintain Simvastatin, minimize simple carbs, add krill oil caps  Tobacco abuse Is not presently smoking

## 2013-07-29 ENCOUNTER — Encounter: Payer: Self-pay | Admitting: Family Medicine

## 2013-07-29 NOTE — Assessment & Plan Note (Addendum)
Is not presently smoking

## 2013-07-29 NOTE — Assessment & Plan Note (Signed)
Well controlled, no changes 

## 2013-07-29 NOTE — Assessment & Plan Note (Signed)
Avoid trans fats maintain Simvastatin, minimize simple carbs, add krill oil caps

## 2013-07-29 NOTE — Assessment & Plan Note (Signed)
hgba1c 5.9, improved, minimize simple carbs.

## 2013-08-07 ENCOUNTER — Telehealth: Payer: Self-pay | Admitting: Family Medicine

## 2013-08-07 NOTE — Telephone Encounter (Signed)
Relevant patient education mailed to patient.  

## 2013-09-09 ENCOUNTER — Other Ambulatory Visit: Payer: Self-pay | Admitting: Family Medicine

## 2013-11-19 ENCOUNTER — Encounter: Payer: Self-pay | Admitting: Family Medicine

## 2013-11-19 ENCOUNTER — Ambulatory Visit (INDEPENDENT_AMBULATORY_CARE_PROVIDER_SITE_OTHER): Payer: Medicare Other | Admitting: Family Medicine

## 2013-11-19 VITALS — BP 102/70 | HR 65 | Temp 98.2°F | Ht 59.25 in | Wt 138.1 lb

## 2013-11-19 DIAGNOSIS — R7309 Other abnormal glucose: Secondary | ICD-10-CM

## 2013-11-19 DIAGNOSIS — F17200 Nicotine dependence, unspecified, uncomplicated: Secondary | ICD-10-CM

## 2013-11-19 DIAGNOSIS — Z720 Tobacco use: Secondary | ICD-10-CM

## 2013-11-19 DIAGNOSIS — E785 Hyperlipidemia, unspecified: Secondary | ICD-10-CM

## 2013-11-19 DIAGNOSIS — R739 Hyperglycemia, unspecified: Secondary | ICD-10-CM

## 2013-11-19 DIAGNOSIS — I10 Essential (primary) hypertension: Secondary | ICD-10-CM

## 2013-11-19 MED ORDER — PROPRANOLOL HCL 40 MG PO TABS: 40.0000 mg | ORAL_TABLET | Freq: Two times a day (BID) | ORAL | Status: DC

## 2013-11-19 NOTE — Patient Instructions (Signed)
DASH Diet  The DASH diet stands for "Dietary Approaches to Stop Hypertension." It is a healthy eating plan that has been shown to reduce high blood pressure (hypertension) in as little as 14 days, while also possibly providing other significant health benefits. These other health benefits include reducing the risk of breast cancer after menopause and reducing the risk of type 2 diabetes, heart disease, colon cancer, and stroke. Health benefits also include weight loss and slowing kidney failure in patients with chronic kidney disease.   DIET GUIDELINES  · Limit salt (sodium). Your diet should contain less than 1500 mg of sodium daily.  · Limit refined or processed carbohydrates. Your diet should include mostly whole grains. Desserts and added sugars should be used sparingly.  · Include small amounts of heart-healthy fats. These types of fats include nuts, oils, and tub margarine. Limit saturated and trans fats. These fats have been shown to be harmful in the body.  CHOOSING FOODS   The following food groups are based on a 2000 calorie diet. See your Registered Dietitian for individual calorie needs.  Grains and Grain Products (6 to 8 servings daily)  · Eat More Often: Whole-wheat bread, brown rice, whole-grain or wheat pasta, quinoa, popcorn without added fat or salt (air popped).  · Eat Less Often: White bread, white pasta, white rice, cornbread.  Vegetables (4 to 5 servings daily)  · Eat More Often: Fresh, frozen, and canned vegetables. Vegetables may be raw, steamed, roasted, or grilled with a minimal amount of fat.  · Eat Less Often/Avoid: Creamed or fried vegetables. Vegetables in a cheese sauce.  Fruit (4 to 5 servings daily)  · Eat More Often: All fresh, canned (in natural juice), or frozen fruits. Dried fruits without added sugar. One hundred percent fruit juice (½ cup [237 mL] daily).  · Eat Less Often: Dried fruits with added sugar. Canned fruit in light or heavy syrup.  Lean Meats, Fish, and Poultry (2  servings or less daily. One serving is 3 to 4 oz [85-114 g]).  · Eat More Often: Ninety percent or leaner ground beef, tenderloin, sirloin. Round cuts of beef, chicken breast, turkey breast. All fish. Grill, bake, or broil your meat. Nothing should be fried.  · Eat Less Often/Avoid: Fatty cuts of meat, turkey, or chicken leg, thigh, or wing. Fried cuts of meat or fish.  Dairy (2 to 3 servings)  · Eat More Often: Low-fat or fat-free milk, low-fat plain or light yogurt, reduced-fat or part-skim cheese.  · Eat Less Often/Avoid: Milk (whole, 2%). Whole milk yogurt. Full-fat cheeses.  Nuts, Seeds, and Legumes (4 to 5 servings per week)  · Eat More Often: All without added salt.  · Eat Less Often/Avoid: Salted nuts and seeds, canned beans with added salt.  Fats and Sweets (limited)  · Eat More Often: Vegetable oils, tub margarines without trans fats, sugar-free gelatin. Mayonnaise and salad dressings.  · Eat Less Often/Avoid: Coconut oils, palm oils, butter, stick margarine, cream, half and half, cookies, candy, pie.  FOR MORE INFORMATION  The Dash Diet Eating Plan: www.dashdiet.org  Document Released: 06/09/2011 Document Revised: 09/12/2011 Document Reviewed: 06/09/2011  [employer]® Patient Information ©2014 [employer].

## 2013-11-19 NOTE — Progress Notes (Signed)
Pre visit review using our clinic review tool, if applicable. No additional management support is needed unless otherwise documented below in the visit note. 

## 2013-11-24 ENCOUNTER — Encounter: Payer: Self-pay | Admitting: Family Medicine

## 2013-11-24 NOTE — Assessment & Plan Note (Signed)
Encouraged heart healthy diet, increase exercise, avoid trans fats, consider a krill oil cap daily 

## 2013-11-24 NOTE — Progress Notes (Signed)
Patient ID: Natalie Swanson, female   DOB: 1936/06/22, 78 y.o.   MRN: [id number] ENMA MAEDA [id number] 04-17-1936 11/24/2013      Progress Note-Follow Up  Subjective  Chief Complaint  Chief Complaint  Patient presents with  . Follow-up    4 month    HPI  Patient is a 78 year old female in today for routine medical care. In today for followup. Generally doing well the present time. No recent illness. Fortunately continues to abstain from smoking. Denies CP/palp/SOB/HA/congestion/fevers/GI or GU c/o. Taking meds as prescribed  Past Medical History  Diagnosis Date  . Shingles 9-12  . Chicken pox as a child  . Flashers or floaters, right eye 9-12    floaters  . Mumps as a child  . Measles as a child  . Cataract 07-04-2006    b/l  . Hypertension   . Hyperlipidemia   . Atopic dermatitis 01/11/2012  . Tobacco abuse 01/11/2012  . Hyperglycemia 07/18/2012    Past Surgical History  Procedure Laterality Date  . Cataracts removed  07-04-2006  . Cesarean section  1981    X 1    Family History  Problem Relation Age of Onset  . Hyperlipidemia Mother   . Hypertension Mother   . Heart attack Father   . Diabetes Father     type 2  . Heart attack Brother   . Heart disease Brother 19    MI  . Hemophilia Son   . Hypertension Son   . Hemophilia Son     History   Social History  . Marital Status: Married    Spouse Name: N/A    Number of Children: N/A  . Years of Education: N/A   Occupational History  . Not on file.   Social History Main Topics  . Smoking status: Former Smoker -- 0.20 packs/day for 61 years    Types: Cigarettes    Start date: 07/04/2012  . Smokeless tobacco: Never Used  . Alcohol Use: No  . Drug Use: No  . Sexual Activity: No   Other Topics Concern  . Not on file   Social History Narrative  . No narrative on file    Current Outpatient Prescriptions on File Prior to Visit  Medication Sig Dispense Refill  . cetirizine (ZYRTEC) 10 MG tablet  Take 1 tablet (10 mg total) by mouth daily as needed for allergies (dermatitis).  30 tablet  3  . Cholecalciferol (D3-1000 PO) Take 1 tablet by mouth daily.        . fish oil-omega-3 fatty acids 1000 MG capsule Take 2 g by mouth daily.        . simvastatin (ZOCOR) 20 MG tablet TAKE 1 TABLET BY MOUTH DAILY  90 tablet  1   No current facility-administered medications on file prior to visit.    Allergies  Allergen Reactions  . Sulfa Antibiotics     Review of Systems  Review of Systems  Constitutional: Negative for fever and malaise/fatigue.  HENT: Negative for congestion.   Eyes: Negative for discharge.  Respiratory: Negative for shortness of breath.   Cardiovascular: Negative for chest pain, palpitations and leg swelling.  Gastrointestinal: Negative for nausea, abdominal pain and diarrhea.  Genitourinary: Negative for dysuria.  Musculoskeletal: Negative for falls.  Skin: Negative for rash.  Neurological: Negative for loss of consciousness and headaches.  Endo/Heme/Allergies: Negative for polydipsia.  Psychiatric/Behavioral: Negative for depression and suicidal ideas. The patient is not nervous/anxious and does not have insomnia.  Objective  BP 102/70  Pulse 65  Temp(Src) 98.2 F (36.8 C) (Oral)  Ht 4' 11.25" (1.505 m)  Wt 138 lb 1.3 oz (62.633 kg)  BMI 27.65 kg/m2  SpO2 94%  Physical Exam  Physical Exam  Constitutional: She is oriented to person, place, and time and well-developed, well-nourished, and in no distress. No distress.  HENT:  Head: Normocephalic and atraumatic.  Eyes: Conjunctivae are normal.  Neck: Neck supple. No thyromegaly present.  Cardiovascular: Normal rate, regular rhythm and normal heart sounds.   No murmur heard. Pulmonary/Chest: Effort normal and breath sounds normal. She has no wheezes.  Abdominal: She exhibits no distension and no mass.  Musculoskeletal: She exhibits no edema.  Lymphadenopathy:    She has no cervical adenopathy.   Neurological: She is alert and oriented to person, place, and time.  Skin: Skin is warm and dry. No rash noted. She is not diaphoretic.  Psychiatric: Memory, affect and judgment normal.    Lab Results  Component Value Date   TSH 1.376 07/24/2013   Lab Results  Component Value Date   WBC 7.9 07/24/2013   HGB 13.7 07/24/2013   HCT 40.9 07/24/2013   MCV 93.0 07/24/2013   PLT 237 07/24/2013   Lab Results  Component Value Date   CREATININE 0.74 07/24/2013   BUN 13 07/24/2013   NA 138 07/24/2013   K 4.5 07/24/2013   CL 104 07/24/2013   CO2 27 07/24/2013   Lab Results  Component Value Date   ALT 19 07/24/2013   AST 21 07/24/2013   ALKPHOS 75 07/24/2013   BILITOT 0.5 07/24/2013   Lab Results  Component Value Date   CHOL 181 07/24/2013   Lab Results  Component Value Date   HDL 62 07/24/2013   Lab Results  Component Value Date   LDLCALC 77 07/24/2013   Lab Results  Component Value Date   TRIG 211* 07/24/2013   Lab Results  Component Value Date   CHOLHDL 2.9 07/24/2013     Assessment & Plan  Hyperglycemia hgba1c acceptable, minimize simple carbs. Increase exercise as tolerated.   Tobacco abuse Encouraged complete cessation. Discussed need to quit as relates to risk of numerous cancers, cardiac and pulmonary disease as well as neurologic complications. Counseled for greater than 3 minutes  Hyperlipidemia Encouraged heart healthy diet, increase exercise, avoid trans fats, consider a krill oil cap daily  Hypertension Denies CP/palp/SOB/HA/congestion/fevers/GI or GU c/o. Taking meds as prescribed

## 2013-11-24 NOTE — Assessment & Plan Note (Signed)
hgba1c acceptable, minimize simple carbs. Increase exercise as tolerated.  

## 2013-11-24 NOTE — Assessment & Plan Note (Signed)
Denies CP/palp/SOB/HA/congestion/fevers/GI or GU c/o. Taking meds as prescribed 

## 2013-11-24 NOTE — Assessment & Plan Note (Addendum)
Continues to abstain from smoking

## 2014-04-15 ENCOUNTER — Other Ambulatory Visit (INDEPENDENT_AMBULATORY_CARE_PROVIDER_SITE_OTHER): Payer: Medicare Other

## 2014-04-15 DIAGNOSIS — I10 Essential (primary) hypertension: Secondary | ICD-10-CM

## 2014-04-15 DIAGNOSIS — R739 Hyperglycemia, unspecified: Secondary | ICD-10-CM

## 2014-04-15 DIAGNOSIS — E785 Hyperlipidemia, unspecified: Secondary | ICD-10-CM

## 2014-04-15 LAB — HEMOGLOBIN A1C: Hgb A1c MFr Bld: 6.3 % (ref 4.6–6.5)

## 2014-04-15 LAB — LIPID PANEL
Cholesterol: 205 mg/dL — ABNORMAL HIGH (ref 0–200)
HDL: 54.7 mg/dL (ref >39.00)
LDL Cholesterol: 115 mg/dL — ABNORMAL HIGH (ref 0–99)
NonHDL: 150.3
Total CHOL/HDL Ratio: 4
Triglycerides: 176 mg/dL — ABNORMAL HIGH (ref 0.0–149.0)
VLDL: 35.2 mg/dL (ref 0.0–40.0)

## 2014-04-15 LAB — HEPATIC FUNCTION PANEL
ALT: 32 U/L (ref 0–35)
AST: 34 U/L (ref 0–37)
Albumin: 3.7 g/dL (ref 3.5–5.2)
Alkaline Phosphatase: 76 U/L (ref 39–117)
Bilirubin, Direct: 0 mg/dL (ref 0.0–0.3)
Total Bilirubin: 0.6 mg/dL (ref 0.2–1.2)
Total Protein: 7.8 g/dL (ref 6.0–8.3)

## 2014-04-15 LAB — RENAL FUNCTION PANEL
Albumin: 3.7 g/dL (ref 3.5–5.2)
BUN: 8 mg/dL (ref 6–23)
CO2: 25 mEq/L (ref 19–32)
Calcium: 9.8 mg/dL (ref 8.4–10.5)
Chloride: 104 mEq/L (ref 96–112)
Creatinine, Ser: 0.8 mg/dL (ref 0.4–1.2)
GFR: 74.09 mL/min (ref >60.00)
Glucose, Bld: 103 mg/dL — ABNORMAL HIGH (ref 70–99)
Phosphorus: 2.9 mg/dL (ref 2.3–4.6)
Potassium: 5.2 mEq/L — ABNORMAL HIGH (ref 3.5–5.1)
Sodium: 139 mEq/L (ref 135–145)

## 2014-04-15 LAB — CBC WITH DIFFERENTIAL/PLATELET
Basophils Absolute: 0.1 10*3/uL (ref 0.0–0.1)
Basophils Relative: 0.7 % (ref 0.0–3.0)
Eosinophils Absolute: 0.3 10*3/uL (ref 0.0–0.7)
Eosinophils Relative: 3.3 % (ref 0.0–5.0)
HCT: 42.1 % (ref 36.0–46.0)
Hemoglobin: 13.6 g/dL (ref 12.0–15.0)
Lymphocytes Relative: 33 % (ref 12.0–46.0)
Lymphs Abs: 2.6 10*3/uL (ref 0.7–4.0)
MCHC: 32.4 g/dL (ref 30.0–36.0)
MCV: 94.6 fl (ref 78.0–100.0)
Monocytes Absolute: 0.6 10*3/uL (ref 0.1–1.0)
Monocytes Relative: 8 % (ref 3.0–12.0)
Neutro Abs: 4.3 10*3/uL (ref 1.4–7.7)
Neutrophils Relative %: 55 % (ref 43.0–77.0)
Platelets: 216 10*3/uL (ref 150.0–400.0)
RBC: 4.45 Mil/uL (ref 3.87–5.11)
RDW: 14.4 % (ref 11.5–15.5)
WBC: 7.8 10*3/uL (ref 4.0–10.5)

## 2014-04-15 LAB — TSH: TSH: 0.39 u[IU]/mL (ref 0.35–4.50)

## 2014-04-18 ENCOUNTER — Encounter: Payer: Self-pay | Admitting: Family Medicine

## 2014-04-18 ENCOUNTER — Ambulatory Visit (INDEPENDENT_AMBULATORY_CARE_PROVIDER_SITE_OTHER): Payer: Medicare Other | Admitting: Family Medicine

## 2014-04-18 ENCOUNTER — Encounter: Payer: Self-pay | Admitting: Internal Medicine

## 2014-04-18 VITALS — BP 97/65 | HR 63 | Temp 98.1°F | Ht 59.25 in | Wt 139.0 lb

## 2014-04-18 DIAGNOSIS — E785 Hyperlipidemia, unspecified: Secondary | ICD-10-CM

## 2014-04-18 DIAGNOSIS — M25571 Pain in right ankle and joints of right foot: Secondary | ICD-10-CM

## 2014-04-18 DIAGNOSIS — Z0000 Encounter for general adult medical examination without abnormal findings: Secondary | ICD-10-CM

## 2014-04-18 DIAGNOSIS — Z780 Asymptomatic menopausal state: Secondary | ICD-10-CM

## 2014-04-18 DIAGNOSIS — Z1211 Encounter for screening for malignant neoplasm of colon: Secondary | ICD-10-CM

## 2014-04-18 DIAGNOSIS — Z23 Encounter for immunization: Secondary | ICD-10-CM

## 2014-04-18 DIAGNOSIS — I10 Essential (primary) hypertension: Secondary | ICD-10-CM

## 2014-04-18 DIAGNOSIS — R739 Hyperglycemia, unspecified: Secondary | ICD-10-CM

## 2014-04-18 MED ORDER — SIMVASTATIN 40 MG PO TABS: 40.0000 mg | ORAL_TABLET | Freq: Every day | ORAL | Status: DC

## 2014-04-18 NOTE — Patient Instructions (Signed)
Preventive Care for Adults A healthy lifestyle and preventive care can promote health and wellness. Preventive health guidelines for women include the following key practices.  A routine yearly physical is a good way to check with your health care provider about your health and preventive screening. It is a chance to share any concerns and updates on your health and to receive a thorough exam.  Visit your dentist for a routine exam and preventive care every 6 months. Brush your teeth twice a day and floss once a day. Good oral hygiene prevents tooth decay and gum disease.  The frequency of eye exams is based on your age, health, family medical history, use of contact lenses, and other factors. Follow your health care provider's recommendations for frequency of eye exams.  Eat a healthy diet. Foods like vegetables, fruits, whole grains, low-fat dairy products, and lean protein foods contain the nutrients you need without too many calories. Decrease your intake of foods high in solid fats, added sugars, and salt. Eat the right amount of calories for you.Get information about a proper diet from your health care provider, if necessary.  Regular physical exercise is one of the most important things you can do for your health. Most adults should get at least 150 minutes of moderate-intensity exercise (any activity that increases your heart rate and causes you to sweat) each week. In addition, most adults need muscle-strengthening exercises on 2 or more days a week.  Maintain a healthy weight. The body mass index (BMI) is a screening tool to identify possible weight problems. It provides an estimate of body fat based on height and weight. Your health care provider can find your BMI and can help you achieve or maintain a healthy weight.For adults 20 years and older:  A BMI below 18.5 is considered underweight.  A BMI of 18.5 to 24.9 is normal.  A BMI of 25 to 29.9 is considered overweight.  A BMI of  30 and above is considered obese.  Maintain normal blood lipids and cholesterol levels by exercising and minimizing your intake of saturated fat. Eat a balanced diet with plenty of fruit and vegetables. Blood tests for lipids and cholesterol should begin at age 76 and be repeated every 5 years. If your lipid or cholesterol levels are high, you are over 50, or you are at high risk for heart disease, you may need your cholesterol levels checked more frequently.Ongoing high lipid and cholesterol levels should be treated with medicines if diet and exercise are not working.  If you smoke, find out from your health care provider how to quit. If you do not use tobacco, do not start.  Lung cancer screening is recommended for adults aged 22-80 years who are at high risk for developing lung cancer because of a history of smoking. A yearly low-dose CT scan of the lungs is recommended for people who have at least a 30-pack-year history of smoking and are a current smoker or have quit within the past 15 years. A pack year of smoking is smoking an average of 1 pack of cigarettes a day for 1 year (for example: 1 pack a day for 30 years or 2 packs a day for 15 years). Yearly screening should continue until the smoker has stopped smoking for at least 15 years. Yearly screening should be stopped for people who develop a health problem that would prevent them from having lung cancer treatment.  If you are pregnant, do not drink alcohol. If you are breastfeeding,  be very cautious about drinking alcohol. If you are not pregnant and choose to drink alcohol, do not have more than 1 drink per day. One drink is considered to be 12 ounces (355 mL) of beer, 5 ounces (148 mL) of wine, or 1.5 ounces (44 mL) of liquor.  Avoid use of street drugs. Do not share needles with anyone. Ask for help if you need support or instructions about stopping the use of drugs.  High blood pressure causes heart disease and increases the risk of  stroke. Your blood pressure should be checked at least every 1 to 2 years. Ongoing high blood pressure should be treated with medicines if weight loss and exercise do not work.  If you are 75-52 years old, ask your health care provider if you should take aspirin to prevent strokes.  Diabetes screening involves taking a blood sample to check your fasting blood sugar level. This should be done once every 3 years, after age 15, if you are within normal weight and without risk factors for diabetes. Testing should be considered at a younger age or be carried out more frequently if you are overweight and have at least 1 risk factor for diabetes.  Breast cancer screening is essential preventive care for women. You should practice "breast self-awareness." This means understanding the normal appearance and feel of your breasts and may include breast self-examination. Any changes detected, no matter how small, should be reported to a health care provider. Women in their 58s and 30s should have a clinical breast exam (CBE) by a health care provider as part of a regular health exam every 1 to 3 years. After age 16, women should have a CBE every year. Starting at age 53, women should consider having a mammogram (breast X-ray test) every year. Women who have a family history of breast cancer should talk to their health care provider about genetic screening. Women at a high risk of breast cancer should talk to their health care providers about having an MRI and a mammogram every year.  Breast cancer gene (BRCA)-related cancer risk assessment is recommended for women who have family members with BRCA-related cancers. BRCA-related cancers include breast, ovarian, tubal, and peritoneal cancers. Having family members with these cancers may be associated with an increased risk for harmful changes (mutations) in the breast cancer genes BRCA1 and BRCA2. Results of the assessment will determine the need for genetic counseling and  BRCA1 and BRCA2 testing.  Routine pelvic exams to screen for cancer are no longer recommended for nonpregnant women who are considered low risk for cancer of the pelvic organs (ovaries, uterus, and vagina) and who do not have symptoms. Ask your health care provider if a screening pelvic exam is right for you.  If you have had past treatment for cervical cancer or a condition that could lead to cancer, you need Pap tests and screening for cancer for at least 20 years after your treatment. If Pap tests have been discontinued, your risk factors (such as having a new sexual partner) need to be reassessed to determine if screening should be resumed. Some women have medical problems that increase the chance of getting cervical cancer. In these cases, your health care provider may recommend more frequent screening and Pap tests.  The HPV test is an additional test that may be used for cervical cancer screening. The HPV test looks for the virus that can cause the cell changes on the cervix. The cells collected during the Pap test can be  tested for HPV. The HPV test could be used to screen women aged 30 years and older, and should be used in women of any age who have unclear Pap test results. After the age of 30, women should have HPV testing at the same frequency as a Pap test.  Colorectal cancer can be detected and often prevented. Most routine colorectal cancer screening begins at the age of 50 years and continues through age 75 years. However, your health care provider may recommend screening at an earlier age if you have risk factors for colon cancer. On a yearly basis, your health care provider may provide home test kits to check for hidden blood in the stool. Use of a small camera at the end of a tube, to directly examine the colon (sigmoidoscopy or colonoscopy), can detect the earliest forms of colorectal cancer. Talk to your health care provider about this at age 50, when routine screening begins. Direct  exam of the colon should be repeated every 5-10 years through age 75 years, unless early forms of pre-cancerous polyps or small growths are found.  People who are at an increased risk for hepatitis B should be screened for this virus. You are considered at high risk for hepatitis B if:  You were born in a country where hepatitis B occurs often. Talk with your health care provider about which countries are considered high risk.  Your parents were born in a high-risk country and you have not received a shot to protect against hepatitis B (hepatitis B vaccine).  You have HIV or AIDS.  You use needles to inject street drugs.  You live with, or have sex with, someone who has hepatitis B.  You get hemodialysis treatment.  You take certain medicines for conditions like cancer, organ transplantation, and autoimmune conditions.  Hepatitis C blood testing is recommended for all people born from 1945 through 1965 and any individual with known risks for hepatitis C.  Practice safe sex. Use condoms and avoid high-risk sexual practices to reduce the spread of sexually transmitted infections (STIs). STIs include gonorrhea, chlamydia, syphilis, trichomonas, herpes, HPV, and human immunodeficiency virus (HIV). Herpes, HIV, and HPV are viral illnesses that have no cure. They can result in disability, cancer, and death.  You should be screened for sexually transmitted illnesses (STIs) including gonorrhea and chlamydia if:  You are sexually active and are younger than 24 years.  You are older than 24 years and your health care provider tells you that you are at risk for this type of infection.  Your sexual activity has changed since you were last screened and you are at an increased risk for chlamydia or gonorrhea. Ask your health care provider if you are at risk.  If you are at risk of being infected with HIV, it is recommended that you take a prescription medicine daily to prevent HIV infection. This is  called preexposure prophylaxis (PrEP). You are considered at risk if:  You are a heterosexual woman, are sexually active, and are at increased risk for HIV infection.  You take drugs by injection.  You are sexually active with a partner who has HIV.  Talk with your health care provider about whether you are at high risk of being infected with HIV. If you choose to begin PrEP, you should first be tested for HIV. You should then be tested every 3 months for as long as you are taking PrEP.  Osteoporosis is a disease in which the bones lose minerals and strength   with aging. This can result in serious bone fractures or breaks. The risk of osteoporosis can be identified using a bone density scan. Women ages 65 years and over and women at risk for fractures or osteoporosis should discuss screening with their health care providers. Ask your health care provider whether you should take a calcium supplement or vitamin D to reduce the rate of osteoporosis.  Menopause can be associated with physical symptoms and risks. Hormone replacement therapy is available to decrease symptoms and risks. You should talk to your health care provider about whether hormone replacement therapy is right for you.  Use sunscreen. Apply sunscreen liberally and repeatedly throughout the day. You should seek shade when your shadow is shorter than you. Protect yourself by wearing long sleeves, pants, a wide-brimmed hat, and sunglasses year round, whenever you are outdoors.  Once a month, do a whole body skin exam, using a mirror to look at the skin on your back. Tell your health care provider of new moles, moles that have irregular borders, moles that are larger than a pencil eraser, or moles that have changed in shape or color.  Stay current with required vaccines (immunizations).  Influenza vaccine. All adults should be immunized every year.  Tetanus, diphtheria, and acellular pertussis (Td, Tdap) vaccine. Pregnant women should  receive 1 dose of Tdap vaccine during each pregnancy. The dose should be obtained regardless of the length of time since the last dose. Immunization is preferred during the 27th-36th week of gestation. An adult who has not previously received Tdap or who does not know her vaccine status should receive 1 dose of Tdap. This initial dose should be followed by tetanus and diphtheria toxoids (Td) booster doses every 10 years. Adults with an unknown or incomplete history of completing a 3-dose immunization series with Td-containing vaccines should begin or complete a primary immunization series including a Tdap dose. Adults should receive a Td booster every 10 years.  Varicella vaccine. An adult without evidence of immunity to varicella should receive 2 doses or a second dose if she has previously received 1 dose. Pregnant females who do not have evidence of immunity should receive the first dose after pregnancy. This first dose should be obtained before leaving the health care facility. The second dose should be obtained 4-8 weeks after the first dose.  Human papillomavirus (HPV) vaccine. Females aged 13-26 years who have not received the vaccine previously should obtain the 3-dose series. The vaccine is not recommended for use in pregnant females. However, pregnancy testing is not needed before receiving a dose. If a female is found to be pregnant after receiving a dose, no treatment is needed. In that case, the remaining doses should be delayed until after the pregnancy. Immunization is recommended for any person with an immunocompromised condition through the age of 26 years if she did not get any or all doses earlier. During the 3-dose series, the second dose should be obtained 4-8 weeks after the first dose. The third dose should be obtained 24 weeks after the first dose and 16 weeks after the second dose.  Zoster vaccine. One dose is recommended for adults aged 60 years or older unless certain conditions are  present.  Measles, mumps, and rubella (MMR) vaccine. Adults born before 1957 generally are considered immune to measles and mumps. Adults born in 1957 or later should have 1 or more doses of MMR vaccine unless there is a contraindication to the vaccine or there is laboratory evidence of immunity to   each of the three diseases. A routine second dose of MMR vaccine should be obtained at least 28 days after the first dose for students attending postsecondary schools, health care workers, or international travelers. People who received inactivated measles vaccine or an unknown type of measles vaccine during 1963-1967 should receive 2 doses of MMR vaccine. People who received inactivated mumps vaccine or an unknown type of mumps vaccine before 1979 and are at high risk for mumps infection should consider immunization with 2 doses of MMR vaccine. For females of childbearing age, rubella immunity should be determined. If there is no evidence of immunity, females who are not pregnant should be vaccinated. If there is no evidence of immunity, females who are pregnant should delay immunization until after pregnancy. Unvaccinated health care workers born before 1957 who lack laboratory evidence of measles, mumps, or rubella immunity or laboratory confirmation of disease should consider measles and mumps immunization with 2 doses of MMR vaccine or rubella immunization with 1 dose of MMR vaccine.  Pneumococcal 13-valent conjugate (PCV13) vaccine. When indicated, a person who is uncertain of her immunization history and has no record of immunization should receive the PCV13 vaccine. An adult aged 19 years or older who has certain medical conditions and has not been previously immunized should receive 1 dose of PCV13 vaccine. This PCV13 should be followed with a dose of pneumococcal polysaccharide (PPSV23) vaccine. The PPSV23 vaccine dose should be obtained at least 8 weeks after the dose of PCV13 vaccine. An adult aged 19  years or older who has certain medical conditions and previously received 1 or more doses of PPSV23 vaccine should receive 1 dose of PCV13. The PCV13 vaccine dose should be obtained 1 or more years after the last PPSV23 vaccine dose.  Pneumococcal polysaccharide (PPSV23) vaccine. When PCV13 is also indicated, PCV13 should be obtained first. All adults aged 65 years and older should be immunized. An adult younger than age 65 years who has certain medical conditions should be immunized. Any person who resides in a nursing home or long-term care facility should be immunized. An adult smoker should be immunized. People with an immunocompromised condition and certain other conditions should receive both PCV13 and PPSV23 vaccines. People with human immunodeficiency virus (HIV) infection should be immunized as soon as possible after diagnosis. Immunization during chemotherapy or radiation therapy should be avoided. Routine use of PPSV23 vaccine is not recommended for American Indians, [place] Natives, or people younger than 65 years unless there are medical conditions that require PPSV23 vaccine. When indicated, people who have unknown immunization and have no record of immunization should receive PPSV23 vaccine. One-time revaccination 5 years after the first dose of PPSV23 is recommended for people aged 19-64 years who have chronic kidney failure, nephrotic syndrome, asplenia, or immunocompromised conditions. People who received 1-2 doses of PPSV23 before age 65 years should receive another dose of PPSV23 vaccine at age 65 years or later if at least 5 years have passed since the previous dose. Doses of PPSV23 are not needed for people immunized with PPSV23 at or after age 65 years.  Meningococcal vaccine. Adults with asplenia or persistent complement component deficiencies should receive 2 doses of quadrivalent meningococcal conjugate (MenACWY-D) vaccine. The doses should be obtained at least 2 months apart.  Microbiologists working with certain meningococcal bacteria, military recruits, people at risk during an outbreak, and people who travel to or live in countries with a high rate of meningitis should be immunized. A first-year college student up through age   21 years who is living in a residence hall should receive a dose if she did not receive a dose on or after her 16th birthday. Adults who have certain high-risk conditions should receive one or more doses of vaccine.  Hepatitis A vaccine. Adults who wish to be protected from this disease, have certain high-risk conditions, work with hepatitis A-infected animals, work in hepatitis A research labs, or travel to or work in countries with a high rate of hepatitis A should be immunized. Adults who were previously unvaccinated and who anticipate close contact with an international adoptee during the first 60 days after arrival in the [place] from a country with a high rate of hepatitis A should be immunized.  Hepatitis B vaccine. Adults who wish to be protected from this disease, have certain high-risk conditions, may be exposed to blood or other infectious body fluids, are household contacts or sex partners of hepatitis B positive people, are clients or workers in certain care facilities, or travel to or work in countries with a high rate of hepatitis B should be immunized.  Haemophilus influenzae type b (Hib) vaccine. A previously unvaccinated person with asplenia or sickle cell disease or having a scheduled splenectomy should receive 1 dose of Hib vaccine. Regardless of previous immunization, a recipient of a hematopoietic stem cell transplant should receive a 3-dose series 6-12 months after her successful transplant. Hib vaccine is not recommended for adults with HIV infection. Preventive Services / Frequency Ages 64 to 68 years  Blood pressure check.** / Every 1 to 2 years.  Lipid and cholesterol check.** / Every 5 years beginning at age  22.  Clinical breast exam.** / Every 3 years for women in their 88s and 53s.  BRCA-related cancer risk assessment.** / For women who have family members with a BRCA-related cancer (breast, ovarian, tubal, or peritoneal cancers).  Pap test.** / Every 2 years from ages 90 through 51. Every 3 years starting at age 21 through age 56 or 3 with a history of 3 consecutive normal Pap tests.  HPV screening.** / Every 3 years from ages 24 through ages 1 to 46 with a history of 3 consecutive normal Pap tests.  Hepatitis C blood test.** / For any individual with known risks for hepatitis C.  Skin self-exam. / Monthly.  Influenza vaccine. / Every year.  Tetanus, diphtheria, and acellular pertussis (Tdap, Td) vaccine.** / Consult your health care provider. Pregnant women should receive 1 dose of Tdap vaccine during each pregnancy. 1 dose of Td every 10 years.  Varicella vaccine.** / Consult your health care provider. Pregnant females who do not have evidence of immunity should receive the first dose after pregnancy.  HPV vaccine. / 3 doses over 6 months, if 72 and younger. The vaccine is not recommended for use in pregnant females. However, pregnancy testing is not needed before receiving a dose.  Measles, mumps, rubella (MMR) vaccine.** / You need at least 1 dose of MMR if you were born in 1957 or later. You may also need a 2nd dose. For females of childbearing age, rubella immunity should be determined. If there is no evidence of immunity, females who are not pregnant should be vaccinated. If there is no evidence of immunity, females who are pregnant should delay immunization until after pregnancy.  Pneumococcal 13-valent conjugate (PCV13) vaccine.** / Consult your health care provider.  Pneumococcal polysaccharide (PPSV23) vaccine.** / 1 to 2 doses if you smoke cigarettes or if you have certain conditions.  Meningococcal vaccine.** /  1 dose if you are age 19 to 21 years and a first-year college  student living in a residence hall, or have one of several medical conditions, you need to get vaccinated against meningococcal disease. You may also need additional booster doses.  Hepatitis A vaccine.** / Consult your health care provider.  Hepatitis B vaccine.** / Consult your health care provider.  Haemophilus influenzae type b (Hib) vaccine.** / Consult your health care provider. Ages 40 to 64 years  Blood pressure check.** / Every 1 to 2 years.  Lipid and cholesterol check.** / Every 5 years beginning at age 20 years.  Lung cancer screening. / Every year if you are aged 55-80 years and have a 30-pack-year history of smoking and currently smoke or have quit within the past 15 years. Yearly screening is stopped once you have quit smoking for at least 15 years or develop a health problem that would prevent you from having lung cancer treatment.  Clinical breast exam.** / Every year after age 40 years.  BRCA-related cancer risk assessment.** / For women who have family members with a BRCA-related cancer (breast, ovarian, tubal, or peritoneal cancers).  Mammogram.** / Every year beginning at age 40 years and continuing for as long as you are in good health. Consult with your health care provider.  Pap test.** / Every 3 years starting at age 30 years through age 65 or 70 years with a history of 3 consecutive normal Pap tests.  HPV screening.** / Every 3 years from ages 30 years through ages 65 to 70 years with a history of 3 consecutive normal Pap tests.  Fecal occult blood test (FOBT) of stool. / Every year beginning at age 50 years and continuing until age 75 years. You may not need to do this test if you get a colonoscopy every 10 years.  Flexible sigmoidoscopy or colonoscopy.** / Every 5 years for a flexible sigmoidoscopy or every 10 years for a colonoscopy beginning at age 50 years and continuing until age 75 years.  Hepatitis C blood test.** / For all people born from 1945 through  1965 and any individual with known risks for hepatitis C.  Skin self-exam. / Monthly.  Influenza vaccine. / Every year.  Tetanus, diphtheria, and acellular pertussis (Tdap/Td) vaccine.** / Consult your health care provider. Pregnant women should receive 1 dose of Tdap vaccine during each pregnancy. 1 dose of Td every 10 years.  Varicella vaccine.** / Consult your health care provider. Pregnant females who do not have evidence of immunity should receive the first dose after pregnancy.  Zoster vaccine.** / 1 dose for adults aged 60 years or older.  Measles, mumps, rubella (MMR) vaccine.** / You need at least 1 dose of MMR if you were born in 1957 or later. You may also need a 2nd dose. For females of childbearing age, rubella immunity should be determined. If there is no evidence of immunity, females who are not pregnant should be vaccinated. If there is no evidence of immunity, females who are pregnant should delay immunization until after pregnancy.  Pneumococcal 13-valent conjugate (PCV13) vaccine.** / Consult your health care provider.  Pneumococcal polysaccharide (PPSV23) vaccine.** / 1 to 2 doses if you smoke cigarettes or if you have certain conditions.  Meningococcal vaccine.** / Consult your health care provider.  Hepatitis A vaccine.** / Consult your health care provider.  Hepatitis B vaccine.** / Consult your health care provider.  Haemophilus influenzae type b (Hib) vaccine.** / Consult your health care provider. Ages 65   years and over  Blood pressure check.** / Every 1 to 2 years.  Lipid and cholesterol check.** / Every 5 years beginning at age 22 years.  Lung cancer screening. / Every year if you are aged 73-80 years and have a 30-pack-year history of smoking and currently smoke or have quit within the past 15 years. Yearly screening is stopped once you have quit smoking for at least 15 years or develop a health problem that would prevent you from having lung cancer  treatment.  Clinical breast exam.** / Every year after age 4 years.  BRCA-related cancer risk assessment.** / For women who have family members with a BRCA-related cancer (breast, ovarian, tubal, or peritoneal cancers).  Mammogram.** / Every year beginning at age 40 years and continuing for as long as you are in good health. Consult with your health care provider.  Pap test.** / Every 3 years starting at age 9 years through age 34 or 91 years with 3 consecutive normal Pap tests. Testing can be stopped between 65 and 70 years with 3 consecutive normal Pap tests and no abnormal Pap or HPV tests in the past 10 years.  HPV screening.** / Every 3 years from ages 57 years through ages 64 or 45 years with a history of 3 consecutive normal Pap tests. Testing can be stopped between 65 and 70 years with 3 consecutive normal Pap tests and no abnormal Pap or HPV tests in the past 10 years.  Fecal occult blood test (FOBT) of stool. / Every year beginning at age 15 years and continuing until age 17 years. You may not need to do this test if you get a colonoscopy every 10 years.  Flexible sigmoidoscopy or colonoscopy.** / Every 5 years for a flexible sigmoidoscopy or every 10 years for a colonoscopy beginning at age 86 years and continuing until age 71 years.  Hepatitis C blood test.** / For all people born from [birth year] through 1963 and any individual with known risks for hepatitis C.  Osteoporosis screening.** / A one-time screening for women ages 83 years and over and women at risk for fractures or osteoporosis.  Skin self-exam. / Monthly.  Influenza vaccine. / Every year.  Tetanus, diphtheria, and acellular pertussis (Tdap/Td) vaccine.** / 1 dose of Td every 10 years.  Varicella vaccine.** / Consult your health care provider.  Zoster vaccine.** / 1 dose for adults aged 61 years or older.  Pneumococcal 13-valent conjugate (PCV13) vaccine.** / Consult your health care provider.  Pneumococcal  polysaccharide (PPSV23) vaccine.** / 1 dose for all adults aged 28 years and older.  Meningococcal vaccine.** / Consult your health care provider.  Hepatitis A vaccine.** / Consult your health care provider.  Hepatitis B vaccine.** / Consult your health care provider.  Haemophilus influenzae type b (Hib) vaccine.** / Consult your health care provider. ** Family history and personal history of risk and conditions may change your health care provider's recommendations. Document Released: 08/16/2001 Document Revised: 11/04/2013 Document Reviewed: 11/15/2010 [facility] Patient Information 2015 [place], [place]. This information is not intended to replace advice given to you by your health care provider. Make sure you discuss any questions you have with your health care provider.

## 2014-04-18 NOTE — Progress Notes (Signed)
Pre visit review using our clinic review tool, if applicable. No additional management support is needed unless otherwise documented below in the visit note. 

## 2014-04-27 ENCOUNTER — Encounter: Payer: Self-pay | Admitting: Family Medicine

## 2014-04-27 DIAGNOSIS — M25579 Pain in unspecified ankle and joints of unspecified foot: Secondary | ICD-10-CM | POA: Insufficient documentation

## 2014-04-27 HISTORY — DX: Pain in unspecified ankle and joints of unspecified foot: M25.579

## 2014-04-27 NOTE — Assessment & Plan Note (Signed)
Well controlled, no changes to meds. Encouraged heart healthy diet such as the DASH diet and exercise as tolerated.  °

## 2014-04-27 NOTE — Assessment & Plan Note (Signed)
hgba1c acceptable, minimize simple carbs. Increase exercise as tolerated. Continue current meds. A1C up slightly patient aware.

## 2014-04-27 NOTE — Progress Notes (Signed)
Patient ID: Natalie Swanson, female   DOB: 1936/03/06, 78 y.o.   MRN: [id number] Natalie Swanson [id number] Jan 12, 1936 04/27/2014      Progress Note-Follow Up  Subjective  Chief Complaint  Chief Complaint  Patient presents with  . Annual Exam    medicare wellness  . Injections    flu    HPI  Patient is a 78 year old female in today for routine medical care. Doing well, no recent illness. Continues to struggle with right foot pain but no recent injury or increasing swelling, No redness or warmth. Able to bare weight. Denies CP/palp/SOB/HA/congestion/fevers/GI or GU c/o. Taking meds as prescribed. Acknowledges has not been exercising routinely or following her low carbohydrate diet well. No polyuria or polydipsia  Past Medical History  Diagnosis Date  . Shingles 9-12  . Chicken pox as a child  . Flashers or floaters, right eye 9-12    floaters  . Mumps as a child  . Measles as a child  . Cataract 07-04-2006    b/l  . Hypertension   . Hyperlipidemia   . Atopic dermatitis 01/11/2012  . Tobacco abuse 01/11/2012  . Hyperglycemia 07/18/2012  . History of tobacco abuse 01/11/2012  . Medicare annual wellness visit, subsequent 04/17/2011    G4P1 s/p 1 svd, no abnormal papas, menarche at 57 menopause at 23, regular     Past Surgical History  Procedure Laterality Date  . Cataracts removed  07-04-2006  . Cesarean section  1981    X 1    Family History  Problem Relation Age of Onset  . Hyperlipidemia Mother   . Hypertension Mother   . Heart disease Mother     MI  . Heart attack Father   . Diabetes Father     type 2  . Heart attack Brother   . Heart disease Brother 67    MI  . Hemophilia Son   . Hypertension Son   . Hemophilia Son     History   Social History  . Marital Status: Married    Spouse Name: N/A    Number of Children: N/A  . Years of Education: N/A   Occupational History  . Not on file.   Social History Main Topics  . Smoking status: Former Smoker --  0.20 packs/day for 61 years    Types: Cigarettes    Start date: 07/04/2012  . Smokeless tobacco: Never Used  . Alcohol Use: No  . Drug Use: No  . Sexual Activity: No     Comment: no dietary restrictions. lives with self.    Other Topics Concern  . Not on file   Social History Narrative  . No narrative on file    Current Outpatient Prescriptions on File Prior to Visit  Medication Sig Dispense Refill  . Cholecalciferol (D3-1000 PO) Take 1 tablet by mouth daily.        . fish oil-omega-3 fatty acids 1000 MG capsule Take 2 g by mouth daily.        . propranolol (INDERAL) 40 MG tablet Take 1 tablet (40 mg total) by mouth 2 (two) times daily.  180 tablet  2   No current facility-administered medications on file prior to visit.    Allergies  Allergen Reactions  . Sulfa Antibiotics     Review of Systems  Review of Systems  Constitutional: Negative for fever, chills and malaise/fatigue.  HENT: Negative for congestion, hearing loss and nosebleeds.   Eyes: Negative for discharge.  Respiratory:  Negative for cough, sputum production, shortness of breath and wheezing.   Cardiovascular: Negative for chest pain, palpitations and leg swelling.  Gastrointestinal: Negative for heartburn, nausea, vomiting, abdominal pain, diarrhea, constipation and blood in stool.  Genitourinary: Negative for dysuria, urgency, frequency and hematuria.  Musculoskeletal: Positive for joint pain. Negative for back pain, falls and myalgias.       Right foot pain, no recent illness  Skin: Negative for rash.  Neurological: Negative for dizziness, tremors, sensory change, focal weakness, loss of consciousness, weakness and headaches.  Endo/Heme/Allergies: Negative for polydipsia. Does not bruise/bleed easily.  Psychiatric/Behavioral: Negative for depression and suicidal ideas. The patient is not nervous/anxious and does not have insomnia.     Objective  BP 97/65  Pulse 63  Temp(Src) 98.1 F (36.7 C) (Oral)   Ht 4' 11.25" (1.505 m)  Wt 139 lb (63.05 kg)  BMI 27.84 kg/m2  SpO2 98%  Physical Exam  Physical Exam  Constitutional: She is oriented to person, place, and time and well-developed, well-nourished, and in no distress. No distress.  HENT:  Head: Normocephalic and atraumatic.  Right Ear: External ear normal.  Left Ear: External ear normal.  Nose: Nose normal.  Mouth/Throat: Oropharynx is clear and moist. No oropharyngeal exudate.  Eyes: Conjunctivae are normal. Pupils are equal, round, and reactive to light. Right eye exhibits no discharge. Left eye exhibits no discharge. No scleral icterus.  Neck: Normal range of motion. Neck supple. No thyromegaly present.  Cardiovascular: Normal rate, regular rhythm, normal heart sounds and intact distal pulses.   No murmur heard. Pulmonary/Chest: Effort normal and breath sounds normal. No respiratory distress. She has no wheezes. She has no rales.  Abdominal: Soft. Bowel sounds are normal. She exhibits no distension and no mass. There is no tenderness.  Musculoskeletal: Normal range of motion. She exhibits no edema and no tenderness.  Lymphadenopathy:    She has no cervical adenopathy.  Neurological: She is alert and oriented to person, place, and time. She has normal reflexes. No cranial nerve deficit. Coordination normal.  Skin: Skin is warm and dry. No rash noted. She is not diaphoretic.  Psychiatric: Mood, memory and affect normal.    Lab Results  Component Value Date   TSH 0.39 04/15/2014   Lab Results  Component Value Date   WBC 7.8 04/15/2014   HGB 13.6 04/15/2014   HCT 42.1 04/15/2014   MCV 94.6 04/15/2014   PLT 216.0 04/15/2014   Lab Results  Component Value Date   CREATININE 0.8 04/15/2014   BUN 8 04/15/2014   NA 139 04/15/2014   K 5.2* 04/15/2014   CL 104 04/15/2014   CO2 25 04/15/2014   Lab Results  Component Value Date   ALT 32 04/15/2014   AST 34 04/15/2014   ALKPHOS 76 04/15/2014   BILITOT 0.6 04/15/2014    Lab Results  Component Value Date   CHOL 205* 04/15/2014   Lab Results  Component Value Date   HDL 54.70 04/15/2014   Lab Results  Component Value Date   LDLCALC 115* 04/15/2014   Lab Results  Component Value Date   TRIG 176.0* 04/15/2014   Lab Results  Component Value Date   CHOLHDL 4 04/15/2014     Assessment & Plan  Hypertension Well controlled, no changes to meds. Encouraged heart healthy diet such as the DASH diet and exercise as tolerated.   Medicare annual wellness visit, subsequent Patient denies any difficulties at home. No trouble with ADLs, depression or falls. No recent  changes to vision or hearing. Is UTD with immunizations. Is UTD with screening. Discussed Advanced Directives, patient agrees to bring [place] copies of documents if can. Encouraged heart healthy diet, exercise as tolerated and adequate sleep. Ordered Bone Density, she agrees to proceed with [facility] and is referred to gastroenterology for colonoscopy. Does not see any other providers routinely. Reviewed labs and scheduled for return visit. Given flu shot today  Hyperglycemia hgba1c acceptable, minimize simple carbs. Increase exercise as tolerated. Continue current meds. A1C up slightly patient aware.  Hyperlipidemia Tolerating statin, encouraged heart healthy diet, avoid trans fats, minimize simple carbs and saturated fats. Increase exercise as tolerated. Increased Simvastatin to 40 mg daily and recheck in 3 months  Pain in joint, ankle and foot Likely arthritis, stay as active as possible, try Salon Pas gel prn

## 2014-04-27 NOTE — Assessment & Plan Note (Signed)
Tolerating statin, encouraged heart healthy diet, avoid trans fats, minimize simple carbs and saturated fats. Increase exercise as tolerated. Increased Simvastatin to 40 mg daily and recheck in 3 months

## 2014-04-27 NOTE — Assessment & Plan Note (Addendum)
Patient denies any difficulties at home. No trouble with ADLs, depression or falls. No recent changes to vision or hearing. Is UTD with immunizations. Is UTD with screening. Discussed Advanced Directives, patient agrees to bring [place] copies of documents if can. Encouraged heart healthy diet, exercise as tolerated and adequate sleep. Ordered Bone Density, she agrees to proceed with [facility] and is referred to gastroenterology for colonoscopy. Does not see any other providers routinely. Reviewed labs and scheduled for return visit. Given flu shot today

## 2014-04-27 NOTE — Assessment & Plan Note (Signed)
Likely arthritis, stay as active as possible, try Salon Pas gel prn

## 2014-06-11 ENCOUNTER — Telehealth: Payer: Self-pay | Admitting: *Deleted

## 2014-06-11 ENCOUNTER — Ambulatory Visit (AMBULATORY_SURGERY_CENTER): Payer: Self-pay | Admitting: *Deleted

## 2014-06-11 VITALS — Ht 59.0 in | Wt 139.0 lb

## 2014-06-11 DIAGNOSIS — Z1211 Encounter for screening for malignant neoplasm of colon: Secondary | ICD-10-CM

## 2014-06-11 NOTE — Telephone Encounter (Signed)
Ok for direct. 

## 2014-06-11 NOTE — Progress Notes (Signed)
Patient denies any allergies to eggs or soy. Patient denies any problems with anesthesia/sedation. Patient denies any oxygen use at home and does not take any diet/weight loss medications. No computer use per patient. Phone note sent to Dr.Gessner.

## 2014-06-11 NOTE — Telephone Encounter (Signed)
Noted. Thanks.

## 2014-06-11 NOTE — Telephone Encounter (Signed)
At age 78. Patient is here for direct screening colonoscopy on 06-19-14. Patient states she has never had a colonoscopy before, she is not having any GI symptoms, no family hx colon cancer per pt, referred by Dr.Blyth. Medical hx reviewed. Is patient ok for direct screening colonoscopy at age 45 or needs office visit first? Please advise. Thank you,Robbin PV

## 2014-06-19 ENCOUNTER — Encounter: Payer: Medicare Other | Admitting: Internal Medicine

## 2014-08-19 ENCOUNTER — Ambulatory Visit (INDEPENDENT_AMBULATORY_CARE_PROVIDER_SITE_OTHER)
Admission: RE | Admit: 2014-08-19 | Discharge: 2014-08-19 | Disposition: A | Discharge status: Home / Self Care | Payer: Medicare Other | Source: Ambulatory Visit | Attending: Family Medicine | Admitting: Family Medicine

## 2014-08-19 DIAGNOSIS — I10 Essential (primary) hypertension: Secondary | ICD-10-CM

## 2014-08-19 DIAGNOSIS — Z1211 Encounter for screening for malignant neoplasm of colon: Secondary | ICD-10-CM

## 2014-08-19 DIAGNOSIS — Z780 Asymptomatic menopausal state: Secondary | ICD-10-CM | POA: Diagnosis not present

## 2014-08-19 DIAGNOSIS — R739 Hyperglycemia, unspecified: Secondary | ICD-10-CM

## 2014-08-19 DIAGNOSIS — E785 Hyperlipidemia, unspecified: Secondary | ICD-10-CM

## 2014-08-25 ENCOUNTER — Telehealth: Payer: Self-pay | Admitting: Family Medicine

## 2014-08-25 DIAGNOSIS — M8580 Other specified disorders of bone density and structure, unspecified site: Secondary | ICD-10-CM

## 2014-08-25 NOTE — Telephone Encounter (Signed)
Lab entered

## 2014-09-02 ENCOUNTER — Other Ambulatory Visit (INDEPENDENT_AMBULATORY_CARE_PROVIDER_SITE_OTHER): Payer: Medicare Other

## 2014-09-02 DIAGNOSIS — M8580 Other specified disorders of bone density and structure, unspecified site: Secondary | ICD-10-CM | POA: Diagnosis not present

## 2014-09-02 DIAGNOSIS — I10 Essential (primary) hypertension: Secondary | ICD-10-CM

## 2014-09-02 DIAGNOSIS — E785 Hyperlipidemia, unspecified: Secondary | ICD-10-CM

## 2014-09-02 DIAGNOSIS — R739 Hyperglycemia, unspecified: Secondary | ICD-10-CM | POA: Diagnosis not present

## 2014-09-02 LAB — HEPATIC FUNCTION PANEL
ALT: 28 U/L (ref 0–35)
AST: 31 U/L (ref 0–37)
Albumin: 3.8 g/dL (ref 3.5–5.2)
Alkaline Phosphatase: 73 U/L (ref 39–117)
Bilirubin, Direct: 0.1 mg/dL (ref 0.0–0.3)
Total Bilirubin: 0.4 mg/dL (ref 0.2–1.2)
Total Protein: 7 g/dL (ref 6.0–8.3)

## 2014-09-02 LAB — LIPID PANEL
Cholesterol: 143 mg/dL (ref 0–200)
HDL: 51.5 mg/dL (ref >39.00)
LDL Cholesterol: 70 mg/dL (ref 0–99)
NonHDL: 91.5
Total CHOL/HDL Ratio: 3
Triglycerides: 107 mg/dL (ref 0.0–149.0)
VLDL: 21.4 mg/dL (ref 0.0–40.0)

## 2014-09-02 LAB — CBC
HCT: 37.8 % (ref 36.0–46.0)
Hemoglobin: 12.7 g/dL (ref 12.0–15.0)
MCHC: 33.5 g/dL (ref 30.0–36.0)
MCV: 92.3 fl (ref 78.0–100.0)
Platelets: 244 10*3/uL (ref 150.0–400.0)
RBC: 4.09 Mil/uL (ref 3.87–5.11)
RDW: 13.8 % (ref 11.5–15.5)
WBC: 8.1 10*3/uL (ref 4.0–10.5)

## 2014-09-02 LAB — RENAL FUNCTION PANEL
Albumin: 3.8 g/dL (ref 3.5–5.2)
BUN: 11 mg/dL (ref 6–23)
CO2: 25 mEq/L (ref 19–32)
Calcium: 9.3 mg/dL (ref 8.4–10.5)
Chloride: 108 mEq/L (ref 96–112)
Creatinine, Ser: 0.64 mg/dL (ref 0.40–1.20)
GFR: 95.75 mL/min (ref >60.00)
Glucose, Bld: 112 mg/dL — ABNORMAL HIGH (ref 70–99)
Phosphorus: 2.9 mg/dL (ref 2.3–4.6)
Potassium: 4.1 mEq/L (ref 3.5–5.1)
Sodium: 138 mEq/L (ref 135–145)

## 2014-09-02 LAB — VITAMIN D 25 HYDROXY (VIT D DEFICIENCY, FRACTURES): VITD: 19.21 ng/mL — ABNORMAL LOW (ref 30.00–100.00)

## 2014-09-02 LAB — HEMOGLOBIN A1C: Hgb A1c MFr Bld: 6.7 % — ABNORMAL HIGH (ref 4.6–6.5)

## 2014-09-02 LAB — TSH: TSH: 1.46 u[IU]/mL (ref 0.35–4.50)

## 2014-09-04 ENCOUNTER — Other Ambulatory Visit: Payer: Self-pay | Admitting: Family Medicine

## 2014-09-04 DIAGNOSIS — E559 Vitamin D deficiency, unspecified: Secondary | ICD-10-CM

## 2014-09-04 MED ORDER — VITAMIN D (ERGOCALCIFEROL) 1.25 MG (50000 UNIT) PO CAPS: 50000.0000 [IU] | ORAL_CAPSULE | ORAL | Status: DC

## 2014-09-04 NOTE — Telephone Encounter (Signed)
Vitamin D lab ordered and Vitamin D 50,000 IU caps sent in to pharmacy per PCP instructions.

## 2014-09-05 ENCOUNTER — Other Ambulatory Visit: Payer: Self-pay | Admitting: Family Medicine

## 2014-09-09 ENCOUNTER — Ambulatory Visit (INDEPENDENT_AMBULATORY_CARE_PROVIDER_SITE_OTHER): Payer: Medicare Other | Admitting: Family Medicine

## 2014-09-09 ENCOUNTER — Encounter: Payer: Self-pay | Admitting: Family Medicine

## 2014-09-09 VITALS — BP 112/72 | HR 79 | Temp 98.6°F | Ht 60.0 in | Wt 141.1 lb

## 2014-09-09 DIAGNOSIS — R739 Hyperglycemia, unspecified: Secondary | ICD-10-CM | POA: Diagnosis not present

## 2014-09-09 DIAGNOSIS — E782 Mixed hyperlipidemia: Secondary | ICD-10-CM | POA: Diagnosis not present

## 2014-09-09 DIAGNOSIS — E559 Vitamin D deficiency, unspecified: Secondary | ICD-10-CM

## 2014-09-09 DIAGNOSIS — I10 Essential (primary) hypertension: Secondary | ICD-10-CM | POA: Diagnosis not present

## 2014-09-09 DIAGNOSIS — E785 Hyperlipidemia, unspecified: Secondary | ICD-10-CM | POA: Diagnosis not present

## 2014-09-09 DIAGNOSIS — Z1212 Encounter for screening for malignant neoplasm of rectum: Secondary | ICD-10-CM

## 2014-09-09 MED ORDER — SIMVASTATIN 40 MG PO TABS: 40.0000 mg | ORAL_TABLET | Freq: Every day | ORAL | Status: DC

## 2014-09-09 NOTE — Progress Notes (Signed)
Pre visit review using our clinic review tool, if applicable. No additional management support is needed unless otherwise documented below in the visit note. 

## 2014-09-09 NOTE — Patient Instructions (Signed)
Add Vitamin 1000 IU capsules 1 daily    Hypertension Hypertension, commonly called high blood pressure, is when the force of blood pumping through your arteries is too strong. Your arteries are the blood vessels that carry blood from your heart throughout your body. A blood pressure reading consists of a higher number over a lower number, such as 110/72. The higher number (systolic) is the pressure inside your arteries when your heart pumps. The lower number (diastolic) is the pressure inside your arteries when your heart relaxes. Ideally you want your blood pressure below 120/80. Hypertension forces your heart to work harder to pump blood. Your arteries may become narrow or stiff. Having hypertension puts you at risk for heart disease, stroke, and other problems.  RISK FACTORS Some risk factors for high blood pressure are controllable. Others are not.  Risk factors you cannot control include:   Race. You may be at higher risk if you are African American.  Age. Risk increases with age.  Gender. Men are at higher risk than women before age 41 years. After age 62, women are at higher risk than men. Risk factors you can control include:  Not getting enough exercise or physical activity.  Being overweight.  Getting too much fat, sugar, calories, or salt in your diet.  Drinking too much alcohol. SIGNS AND SYMPTOMS Hypertension does not usually cause signs or symptoms. Extremely high blood pressure (hypertensive crisis) may cause headache, anxiety, shortness of breath, and nosebleed. DIAGNOSIS  To check if you have hypertension, your health care provider will measure your blood pressure while you are seated, with your arm held at the level of your heart. It should be measured at least twice using the same arm. Certain conditions can cause a difference in blood pressure between your right and left arms. A blood pressure reading that is higher than normal on one occasion does not mean that you need  treatment. If one blood pressure reading is high, ask your health care provider about having it checked again. TREATMENT  Treating high blood pressure includes making lifestyle changes and possibly taking medicine. Living a healthy lifestyle can help lower high blood pressure. You may need to change some of your habits. Lifestyle changes may include:  Following the DASH diet. This diet is high in fruits, vegetables, and whole grains. It is low in salt, red meat, and added sugars.  Getting at least 2 hours of brisk physical activity every week.  Losing weight if necessary.  Not smoking.  Limiting alcoholic beverages.  Learning ways to reduce stress. If lifestyle changes are not enough to get your blood pressure under control, your health care provider may prescribe medicine. You may need to take more than one. Work closely with your health care provider to understand the risks and benefits. HOME CARE INSTRUCTIONS  Have your blood pressure rechecked as directed by your health care provider.   Take medicines only as directed by your health care provider. Follow the directions carefully. Blood pressure medicines must be taken as prescribed. The medicine does not work as well when you skip doses. Skipping doses also puts you at risk for problems.   Do not smoke.   Monitor your blood pressure at home as directed by your health care provider. SEEK MEDICAL CARE IF:   You think you are having a reaction to medicines taken.  You have recurrent headaches or feel dizzy.  You have swelling in your ankles.  You have trouble with your vision. [place]  CARE IF:  You develop a severe headache or confusion.  You have unusual weakness, numbness, or feel faint.  You have severe chest or abdominal pain.  You vomit repeatedly.  You have trouble breathing. MAKE SURE YOU:   Understand these instructions.  Will watch your condition.  Will get help right away if you are  not doing well or get worse. Document Released: 06/20/2005 Document Revised: 11/04/2013 Document Reviewed: 04/12/2013 [facility] Patient Information 2015 [place], [place]. This information is not intended to replace advice given to you by your health care provider. Make sure you discuss any questions you have with your health care provider.

## 2014-09-14 ENCOUNTER — Encounter: Payer: Self-pay | Admitting: Family Medicine

## 2014-09-14 DIAGNOSIS — E559 Vitamin D deficiency, unspecified: Secondary | ICD-10-CM

## 2014-09-14 HISTORY — DX: Vitamin D deficiency, unspecified: E55.9

## 2014-09-14 NOTE — Assessment & Plan Note (Signed)
Tolerating statin, encouraged heart healthy diet, avoid trans fats, minimize simple carbs and saturated fats. Increase exercise as tolerated 

## 2014-09-14 NOTE — Assessment & Plan Note (Signed)
Well controlled, no changes to meds. Encouraged heart healthy diet such as the DASH diet and exercise as tolerated.  °

## 2014-09-14 NOTE — Progress Notes (Signed)
Natalie Swanson  [id number] 1936/06/04 09/14/2014      Progress Note-Follow Up  Subjective  Chief Complaint  Chief Complaint  Patient presents with  . Follow-up    HPI  Patient is a 79 y.o. female in today for routine medical care. Patient is in today for follow-up. Generally doing well. Denying polyuria or polydipsia. No recent illness. Denies CP/palp/SOB/HA/congestion/fevers/GI or GU c/o. Taking meds as prescribed  Past Medical History  Diagnosis Date  . Shingles 9-12  . Chicken pox as a child  . Flashers or floaters, right eye 9-12    floaters  . Mumps as a child  . Measles as a child  . Cataract 07-04-2006    b/l  . Hypertension   . Hyperlipidemia   . Atopic dermatitis 01/11/2012  . Tobacco abuse 01/11/2012  . Hyperglycemia 07/18/2012  . History of tobacco abuse 01/11/2012  . Medicare annual wellness visit, subsequent 04/17/2011    G4P1 s/p 1 svd, no abnormal papas, menarche at 54 menopause at 47, regular   . Pain in joint, ankle and foot 04/27/2014    right  . Vitamin D deficiency 09/14/2014    Past Surgical History  Procedure Laterality Date  . Cataracts removed  07-04-2006  . Cesarean section  1981    X 1    Family History  Problem Relation Age of Onset  . Hyperlipidemia Mother   . Hypertension Mother   . Heart disease Mother     MI  . Heart attack Father   . Diabetes Father     type 2  . Heart attack Brother   . Heart disease Brother 38    MI  . Hemophilia Son   . Hypertension Son   . Hemophilia Son   . Colon cancer Neg Hx   . Stomach cancer Neg Hx     History   Social History  . Marital Status: Married    Spouse Name: N/A  . Number of Children: N/A  . Years of Education: N/A   Occupational History  . Not on file.   Social History Main Topics  . Smoking status: Former Smoker -- 0.20 packs/day for 61 years    Types: Cigarettes    Start date: 07/04/2012  . Smokeless tobacco: Current User     Comment: uses e cigs ; quit smoking  cigarettes 2013.  Marland Kitchen Alcohol Use: No  . Drug Use: No  . Sexual Activity: No     Comment: no dietary restrictions. lives with self.    Other Topics Concern  . Not on file   Social History Narrative    Current Outpatient Prescriptions on File Prior to Visit  Medication Sig Dispense Refill  . Cholecalciferol (D3-1000 PO) Take 1 tablet by mouth daily.      . fish oil-omega-3 fatty acids 1000 MG capsule Take 2 g by mouth daily.      . propranolol (INDERAL) 40 MG tablet TAKE 1 TABLET BY MOUTH TWICE A DAY 180 tablet 2  . Vitamin D, Ergocalciferol, (DRISDOL) 50000 UNITS CAPS capsule Take 1 capsule (50,000 Units total) by mouth every 7 (seven) days. 4 capsule 3   No current facility-administered medications on file prior to visit.    Allergies  Allergen Reactions  . Sulfa Antibiotics Rash    Review of Systems  Review of Systems  Constitutional: Negative for fever and malaise/fatigue.  HENT: Negative for congestion.   Eyes: Negative for discharge.  Respiratory: Negative for shortness of breath.  Cardiovascular: Negative for chest pain, palpitations and leg swelling.  Gastrointestinal: Negative for nausea, abdominal pain and diarrhea.  Genitourinary: Negative for dysuria.  Musculoskeletal: Negative for falls.  Skin: Negative for rash.  Neurological: Negative for loss of consciousness and headaches.  Endo/Heme/Allergies: Negative for polydipsia.  Psychiatric/Behavioral: Negative for depression and suicidal ideas. The patient is not nervous/anxious and does not have insomnia.     Objective  BP 112/72 mmHg  Pulse 79  Temp(Src) 98.6 F (37 C) (Oral)  Ht 5' (1.524 m)  Wt 141 lb 2 oz (64.014 kg)  BMI 27.56 kg/m2  SpO2 98%  Physical Exam  Physical Exam  Constitutional: She is oriented to person, place, and time and well-developed, well-nourished, and in no distress. No distress.  HENT:  Head: Normocephalic and atraumatic.  Eyes: Conjunctivae are normal.  Neck: Neck supple.  No thyromegaly present.  Cardiovascular: Normal rate, regular rhythm and normal heart sounds.   No murmur heard. Pulmonary/Chest: Effort normal and breath sounds normal. She has no wheezes.  Abdominal: She exhibits no distension and no mass.  Musculoskeletal: She exhibits no edema.  Lymphadenopathy:    She has no cervical adenopathy.  Neurological: She is alert and oriented to person, place, and time.  Skin: Skin is warm and dry. No rash noted. She is not diaphoretic.  Psychiatric: Memory, affect and judgment normal.    Lab Results  Component Value Date   TSH 1.46 09/02/2014   Lab Results  Component Value Date   WBC 8.1 09/02/2014   HGB 12.7 09/02/2014   HCT 37.8 09/02/2014   MCV 92.3 09/02/2014   PLT 244.0 09/02/2014   Lab Results  Component Value Date   CREATININE 0.64 09/02/2014   BUN 11 09/02/2014   NA 138 09/02/2014   K 4.1 09/02/2014   CL 108 09/02/2014   CO2 25 09/02/2014   Lab Results  Component Value Date   ALT 28 09/02/2014   AST 31 09/02/2014   ALKPHOS 73 09/02/2014   BILITOT 0.4 09/02/2014   Lab Results  Component Value Date   CHOL 143 09/02/2014   Lab Results  Component Value Date   HDL 51.50 09/02/2014   Lab Results  Component Value Date   LDLCALC 70 09/02/2014   Lab Results  Component Value Date   TRIG 107.0 09/02/2014   Lab Results  Component Value Date   CHOLHDL 3 09/02/2014     Assessment & Plan  Hypertension Well controlled, no changes to meds. Encouraged heart healthy diet such as the DASH diet and exercise as tolerated.    Hyperlipidemia Tolerating statin, encouraged heart healthy diet, avoid trans fats, minimize simple carbs and saturated fats. Increase exercise as tolerated   Hyperglycemia hgba1c acceptable, minimize simple carbs. Increase exercise as tolerated. Continue current meds   Vitamin D deficiency Started on daily OTC supplement and 50000 IU caps weekly and recheck in 3 months

## 2014-09-14 NOTE — Assessment & Plan Note (Signed)
hgba1c acceptable, minimize simple carbs. Increase exercise as tolerated. Continue current meds 

## 2014-09-14 NOTE — Assessment & Plan Note (Signed)
Started on daily OTC supplement and 50000 IU caps weekly and recheck in 3 months

## 2014-09-16 ENCOUNTER — Other Ambulatory Visit: Payer: Medicare Other

## 2014-09-16 NOTE — Addendum Note (Signed)
Addended by: Modena Morrow D on: 09/16/2014 04:41 PM   Modules accepted: Orders

## 2014-09-16 NOTE — Addendum Note (Signed)
Addended by: Modena Morrow D on: 09/16/2014 04:39 PM   Modules accepted: Orders

## 2014-09-16 NOTE — Addendum Note (Signed)
Addended by: Modena Morrow D on: 09/16/2014 04:34 PM   Modules accepted: Orders

## 2014-09-17 ENCOUNTER — Other Ambulatory Visit (INDEPENDENT_AMBULATORY_CARE_PROVIDER_SITE_OTHER): Payer: Medicare Other

## 2014-09-17 DIAGNOSIS — Z1212 Encounter for screening for malignant neoplasm of rectum: Secondary | ICD-10-CM | POA: Diagnosis not present

## 2014-09-17 LAB — FECAL OCCULT BLOOD, IMMUNOCHEMICAL: Fecal Occult Bld: POSITIVE — AB

## 2014-09-18 ENCOUNTER — Other Ambulatory Visit: Payer: Self-pay | Admitting: Family Medicine

## 2014-09-18 DIAGNOSIS — R195 Other fecal abnormalities: Secondary | ICD-10-CM

## 2014-10-02 ENCOUNTER — Encounter: Payer: Self-pay | Admitting: Nurse Practitioner

## 2014-10-02 ENCOUNTER — Ambulatory Visit (INDEPENDENT_AMBULATORY_CARE_PROVIDER_SITE_OTHER): Payer: Medicare Other | Admitting: Nurse Practitioner

## 2014-10-02 VITALS — BP 132/90 | HR 64 | Ht 59.25 in | Wt 142.0 lb

## 2014-10-02 DIAGNOSIS — R195 Other fecal abnormalities: Secondary | ICD-10-CM | POA: Insufficient documentation

## 2014-10-02 MED ORDER — POLYETHYLENE GLYCOL 3350 17 GM/SCOOP PO POWD: ORAL | Status: DC

## 2014-10-02 NOTE — Progress Notes (Signed)
HPI :  Patient is a 79 year old female referred by PCP for colon cancer screening. Patient was scheduled for direct colonoscopy with Dr. Carlean Purl in December but she was under a lot of stress at the time and chose to cancel. Since then patient has had a follow-up with her PCP and found to have a positive immunochemical fecal occult blood test. Patient has no bowel changes, abdominal pain, weight loss, or overt bleeding. She feels fine.  Past Medical History  Diagnosis Date  . Shingles 9-12  . Chicken pox as a child  . Flashers or floaters, right eye 9-12    floaters  . Mumps as a child  . Measles as a child  . Cataract 07-04-2006    b/l  . Hypertension   . Hyperlipidemia   . Atopic dermatitis 01/11/2012  . Hyperglycemia 07/18/2012  . History of tobacco abuse 01/11/2012  . Medicare annual wellness visit, subsequent 04/17/2011    G4P1 s/p 1 svd, no abnormal papas, menarche at 78 menopause at 77, regular   . Pain in joint, ankle and foot 04/27/2014    right  . Vitamin D deficiency 09/14/2014   Family History  Problem Relation Age of Onset  . Hyperlipidemia Mother   . Hypertension Mother   . Heart disease Mother     MI  . Heart attack Father   . Diabetes Father     type 2  . Heart attack Brother   . Heart disease Brother 28    MI  . Hemophilia Son     x 2  . Hypertension Son   . Colon cancer Neg Hx   . Stomach cancer Neg Hx   . Cancer Father     type unknown   History  Substance Use Topics  . Smoking status: Former Smoker -- 0.20 packs/day for 61 years    Types: Cigarettes  . Smokeless tobacco: Never Used     Comment: uses e cigs ; quit smoking cigarettes 2013.  Marland Kitchen Alcohol Use: No   Current Outpatient Prescriptions  Medication Sig Dispense Refill  . Cholecalciferol (D3-1000 PO) Take 1 tablet by mouth daily.      . fish oil-omega-3 fatty acids 1000 MG capsule Take 2 g by mouth daily.      . propranolol (INDERAL) 40 MG tablet TAKE 1 TABLET BY MOUTH TWICE A DAY 180  tablet 2  . simvastatin (ZOCOR) 40 MG tablet Take 1 tablet (40 mg total) by mouth at bedtime. 90 tablet 1  . Vitamin D, Ergocalciferol, (DRISDOL) 50000 UNITS CAPS capsule Take 1 capsule (50,000 Units total) by mouth every 7 (seven) days. 4 capsule 3   No current facility-administered medications for this visit.   Allergies  Allergen Reactions  . Sulfa Antibiotics Rash    Review of Systems: Positive for urine leakage. All other systems reviewed and negative except where noted in HPI.   Physical Exam: BP 132/90 mmHg  Pulse 64  Ht 4' 11.25" (1.505 m)  Wt 142 lb (64.411 kg)  BMI 28.44 kg/m2 Constitutional: Pleasant,well-developed, white female in no acute distress. HEENT: Normocephalic and atraumatic. Conjunctivae are normal. No scleral icterus. Neck supple.  Cardiovascular: Normal rate, regular rhythm.  Pulmonary/chest: Effort normal and breath sounds normal. No wheezing, rales or rhonchi. Abdominal: Soft, nondistended, nontender. Bowel sounds active throughout. There are no masses palpable. No hepatomegaly. Extremities: no edema Lymphadenopathy: No cervical adenopathy noted. Neurological: Alert and oriented to person place and time. Skin: Skin is warm and dry. No  rashes noted. Psychiatric: Normal mood and affect. Behavior is normal.   ASSESSMENT AND PLAN:  79 year old pleasant white female referred for colon cancer screening and a positive immunochemical fecal blood test. Hemoglobin is normal. No overt bleeding. Patient has never had a colonoscopy but is agreeable to proceeding with one. The risks, benefits, and alternatives to colonoscopy with possible biopsy and possible polypectomy were discussed with the patient and she consents to proceed.    CC:  Penni Homans, MD

## 2014-10-02 NOTE — Patient Instructions (Addendum)
You have been scheduled for a colonoscopy. Please follow written instructions given to you at your visit today.  Please pick up your prep supplies at the pharmacy within the next 1-3 days. [facility] . If you use inhalers (even only as needed), please bring them with you on the day of your procedure. Your physician has requested that you go to www.startemmi.com and enter the access code given to you at your visit today. This web site gives a general overview about your procedure. However, you should still follow specific instructions given to you by our office regarding your preparation for the procedure.

## 2014-10-06 ENCOUNTER — Encounter: Payer: Self-pay | Admitting: Internal Medicine

## 2014-10-06 NOTE — Progress Notes (Signed)
Agree with Ms. Guenther's assessment and plan. Carl E. Gessner, MD, FACG   

## 2014-10-30 ENCOUNTER — Encounter: Payer: Medicare Other | Admitting: Internal Medicine

## 2014-11-07 ENCOUNTER — Other Ambulatory Visit: Payer: Self-pay | Admitting: Internal Medicine

## 2014-11-07 ENCOUNTER — Encounter: Payer: Self-pay | Admitting: Internal Medicine

## 2014-11-07 ENCOUNTER — Ambulatory Visit (AMBULATORY_SURGERY_CENTER): Payer: Medicare Other | Admitting: Internal Medicine

## 2014-11-07 VITALS — BP 115/77 | HR 74 | Temp 95.8°F | Resp 22 | Ht 59.0 in | Wt 142.0 lb

## 2014-11-07 DIAGNOSIS — R195 Other fecal abnormalities: Secondary | ICD-10-CM

## 2014-11-07 DIAGNOSIS — D125 Benign neoplasm of sigmoid colon: Secondary | ICD-10-CM

## 2014-11-07 DIAGNOSIS — Z860101 Personal history of adenomatous and serrated colon polyps: Secondary | ICD-10-CM | POA: Insufficient documentation

## 2014-11-07 DIAGNOSIS — Z86010 Personal history of colonic polyps: Secondary | ICD-10-CM

## 2014-11-07 DIAGNOSIS — D124 Benign neoplasm of descending colon: Secondary | ICD-10-CM | POA: Diagnosis not present

## 2014-11-07 HISTORY — DX: Personal history of adenomatous and serrated colon polyps: Z86.0101

## 2014-11-07 HISTORY — DX: Personal history of colonic polyps: Z86.010

## 2014-11-07 MED ORDER — SODIUM CHLORIDE 0.9 % IV SOLN: 500.0000 mL | INTRAVENOUS | Status: DC

## 2014-11-07 NOTE — Progress Notes (Signed)
Called to room to assist during endoscopic procedure.  Patient ID and intended procedure confirmed with present staff. Received instructions for my participation in the procedure from the performing physician.  

## 2014-11-07 NOTE — Patient Instructions (Addendum)
I found and removed 2 small polyps that look benign. I will get these analyzed. I doubt i will recommend any further routine colonoscopy or colon cancer testing.  I appreciate the opportunity to care for you. Gatha Mayer, MD, FACG   YOU HAD AN ENDOSCOPIC PROCEDURE TODAY AT [facility]:   Refer to the procedure report that was given to you for any specific questions about what was found during the examination.  If the procedure report does not answer your questions, please call your gastroenterologist to clarify.  If you requested that your care partner not be given the details of your procedure findings, then the procedure report has been included in a sealed envelope for you to review at your convenience later.  YOU SHOULD EXPECT: Some feelings of bloating in the abdomen. Passage of more gas than usual.  Walking can help get rid of the air that was put into your GI tract during the procedure and reduce the bloating. If you had a lower endoscopy (such as a colonoscopy or flexible sigmoidoscopy) you may notice spotting of blood in your stool or on the toilet paper. If you underwent a bowel prep for your procedure, you may not have a normal bowel movement for a few days.  Please Note:  You might notice some irritation and congestion in your nose or some drainage.  This is from the oxygen used during your procedure.  There is no need for concern and it should clear up in a day or so.  SYMPTOMS TO REPORT IMMEDIATELY:   Following lower endoscopy (colonoscopy or flexible sigmoidoscopy):  Excessive amounts of blood in the stool  Significant tenderness or worsening of abdominal pains  Swelling of the abdomen that is new, acute  Fever of 100F or higher  For urgent or emergent issues, a gastroenterologist can be reached at any hour by calling [phone].   DIET: Your first meal following the procedure should be a small meal and then it is ok to progress to your  normal diet. Heavy or fried foods are harder to digest and may make you feel nauseous or bloated.  Likewise, meals heavy in dairy and vegetables can increase bloating.  Drink plenty of fluids but you should avoid alcoholic beverages for 24 hours.  ACTIVITY:  You should plan to take it easy for the rest of today and you should NOT DRIVE or use heavy machinery until tomorrow (because of the sedation medicines used during the test).    FOLLOW UP: Our staff will call the number listed on your records the next business day following your procedure to check on you and address any questions or concerns that you may have regarding the information given to you following your procedure. If we do not reach you, we will leave a message.  However, if you are feeling well and you are not experiencing any problems, there is no need to return our call.  We will assume that you have returned to your regular daily activities without incident.  If any biopsies were taken you will be contacted by phone or by letter within the next 1-3 weeks.  Please call us at [phone] if you have not heard about the biopsies in 3 weeks.    SIGNATURES/CONFIDENTIALITY: You and/or your care partner have signed paperwork which will be entered into your electronic medical record.  These signatures attest to the fact that that the information above on your After Visit Summary has been reviewed  and is understood.  Full responsibility of the confidentiality of this discharge information lies with you and/or your care-partner.  Read all of the handouts given to you by your recovery room nurse.

## 2014-11-07 NOTE — Progress Notes (Signed)
Report to PACU, RN, vss, BBS= Clear.  

## 2014-11-07 NOTE — Op Note (Signed)
[facility]  [address] [place], [id number]   COLONOSCOPY PROCEDURE REPORT  PATIENT: Natalie Swanson, Natalie Swanson  MR#: [id number] BIRTHDATE: 1936-05-07 , 78  yrs. old GENDER: female ENDOSCOPIST: Gatha Mayer, MD, [facility] PROCEDURE DATE:  11/07/2014 PROCEDURE:   Colonoscopy, diagnostic and Colonoscopy with snare polypectomy First Screening Colonoscopy - Avg.  risk and is 50 yrs.  old or older - No.  Prior Negative Screening - Now for repeat screening. N/A  History of Adenoma - Now for follow-up colonoscopy & has been > or = to 3 yrs.  N/A  Polyps Removed Today ASA CLASS:   Class II INDICATIONS:Evaluation of unexplained GI bleeding and Patient is not applicable for Colorectal Neoplasm Risk Assessment for this procedure. MEDICATIONS: Propofol 240 mg IV and Monitored anesthesia care  DESCRIPTION OF PROCEDURE:   After the risks benefits and alternatives of the procedure were thoroughly explained, informed consent was obtained.  The digital rectal exam revealed no abnormalities of the rectum.   The LB [id number]  endoscope was introduced through the anus and advanced to the cecum, which was identified by both the appendix and ileocecal valve. No adverse events experienced.   The quality of the prep was excellent. (MiraLax was used)  The instrument was then slowly withdrawn as the colon was fully examined.      COLON FINDINGS: Two sessile polyps ranging from 3 to 3mm in size were found in the descending colon (18mm) and sigmoid colon (3 mm). Polypectomies were performed with a cold snare.  The resection was complete, the polyp tissue was completely retrieved and sent to histology.   The examination was otherwise normal.  Retroflexed views revealed no abnormalities. The time to cecum = 4.1 Withdrawal time = 13.6   The scope was withdrawn and the procedure completed. COMPLICATIONS: There were no immediate complications.  ENDOSCOPIC IMPRESSION: 1.   Two sessile  polyps ranging from 3 to 37mm in size were found in the descending colon and sigmoid colon; polypectomies were performed with a cold snare 2.   The examination was otherwise normal - excellent prep  RECOMMENDATIONS: Unlikely to need another routine colonoscopy.  eSigned:  Gatha Mayer, MD, [facility] 11/07/2014 3:40 PM   cc: Willette Alma, MD and The Patient

## 2014-11-10 ENCOUNTER — Telehealth: Payer: Self-pay | Admitting: *Deleted

## 2014-11-10 NOTE — Telephone Encounter (Signed)
  Follow up Call-  Call back number 11/07/2014  Post procedure Call Back phone  # [phone]  Permission to leave phone message Yes     Patient questions:  Do you have a fever, pain , or abdominal swelling? No. Pain Score  0 *  Have you tolerated food without any problems? Yes.    Have you been able to return to your normal activities? Yes.    Do you have any questions about your discharge instructions: Diet   No. Medications  No. Follow up visit  No.  Do you have questions or concerns about your Care? No.  Actions: * If pain score is 4 or above: No action needed, pain <4.

## 2014-11-14 ENCOUNTER — Encounter: Payer: Self-pay | Admitting: Internal Medicine

## 2014-11-14 NOTE — Progress Notes (Signed)
Quick Note:  2 subcentimeter adenomas - no repeat colonoscopy (age) ______

## 2014-12-10 ENCOUNTER — Other Ambulatory Visit (INDEPENDENT_AMBULATORY_CARE_PROVIDER_SITE_OTHER): Payer: Commercial Managed Care - HMO

## 2014-12-10 DIAGNOSIS — E559 Vitamin D deficiency, unspecified: Secondary | ICD-10-CM

## 2014-12-10 DIAGNOSIS — R739 Hyperglycemia, unspecified: Secondary | ICD-10-CM

## 2014-12-10 DIAGNOSIS — E782 Mixed hyperlipidemia: Secondary | ICD-10-CM | POA: Diagnosis not present

## 2014-12-10 DIAGNOSIS — I10 Essential (primary) hypertension: Secondary | ICD-10-CM

## 2014-12-10 LAB — COMPREHENSIVE METABOLIC PANEL
ALT: 33 U/L (ref 0–35)
AST: 31 U/L (ref 0–37)
Albumin: 3.9 g/dL (ref 3.5–5.2)
Alkaline Phosphatase: 79 U/L (ref 39–117)
BUN: 9 mg/dL (ref 6–23)
CO2: 29 mEq/L (ref 19–32)
Calcium: 9.3 mg/dL (ref 8.4–10.5)
Chloride: 103 mEq/L (ref 96–112)
Creatinine, Ser: 0.7 mg/dL (ref 0.40–1.20)
GFR: 86.28 mL/min (ref >60.00)
Glucose, Bld: 94 mg/dL (ref 70–99)
Potassium: 3.9 mEq/L (ref 3.5–5.1)
Sodium: 137 mEq/L (ref 135–145)
Total Bilirubin: 0.5 mg/dL (ref 0.2–1.2)
Total Protein: 6.9 g/dL (ref 6.0–8.3)

## 2014-12-10 LAB — CBC
HCT: 39.6 % (ref 36.0–46.0)
Hemoglobin: 13 g/dL (ref 12.0–15.0)
MCHC: 32.9 g/dL (ref 30.0–36.0)
MCV: 93.3 fl (ref 78.0–100.0)
Platelets: 197 10*3/uL (ref 150.0–400.0)
RBC: 4.25 Mil/uL (ref 3.87–5.11)
RDW: 14.3 % (ref 11.5–15.5)
WBC: 7.3 10*3/uL (ref 4.0–10.5)

## 2014-12-10 LAB — LIPID PANEL
Cholesterol: 159 mg/dL (ref 0–200)
HDL: 52.6 mg/dL (ref >39.00)
LDL Cholesterol: 72 mg/dL (ref 0–99)
NonHDL: 106.4
Total CHOL/HDL Ratio: 3
Triglycerides: 172 mg/dL — ABNORMAL HIGH (ref 0.0–149.0)
VLDL: 34.4 mg/dL (ref 0.0–40.0)

## 2014-12-10 LAB — VITAMIN D 25 HYDROXY (VIT D DEFICIENCY, FRACTURES): VITD: 47.63 ng/mL (ref 30.00–100.00)

## 2014-12-10 LAB — HEMOGLOBIN A1C: Hgb A1c MFr Bld: 6.4 % (ref 4.6–6.5)

## 2014-12-10 LAB — TSH: TSH: 2.05 u[IU]/mL (ref 0.35–4.50)

## 2014-12-19 ENCOUNTER — Ambulatory Visit (INDEPENDENT_AMBULATORY_CARE_PROVIDER_SITE_OTHER): Payer: Commercial Managed Care - HMO | Admitting: Family Medicine

## 2014-12-19 ENCOUNTER — Encounter: Payer: Self-pay | Admitting: Family Medicine

## 2014-12-19 VITALS — BP 120/72 | HR 53 | Temp 97.7°F | Ht 60.0 in | Wt 142.1 lb

## 2014-12-19 DIAGNOSIS — I10 Essential (primary) hypertension: Secondary | ICD-10-CM | POA: Diagnosis not present

## 2014-12-19 DIAGNOSIS — E559 Vitamin D deficiency, unspecified: Secondary | ICD-10-CM | POA: Diagnosis not present

## 2014-12-19 DIAGNOSIS — E785 Hyperlipidemia, unspecified: Secondary | ICD-10-CM

## 2014-12-19 DIAGNOSIS — R739 Hyperglycemia, unspecified: Secondary | ICD-10-CM

## 2014-12-19 MED ORDER — PROPRANOLOL HCL 40 MG PO TABS: 40.0000 mg | ORAL_TABLET | Freq: Two times a day (BID) | ORAL | Status: DC

## 2014-12-19 MED ORDER — SIMVASTATIN 40 MG PO TABS: 40.0000 mg | ORAL_TABLET | Freq: Every day | ORAL | Status: DC

## 2014-12-19 NOTE — Patient Instructions (Signed)
Vitamin D 2000 IU daily Basic Carbohydrate Counting for Diabetes Mellitus Carbohydrate counting is a method for keeping track of the amount of carbohydrates you eat. Eating carbohydrates naturally increases the level of sugar (glucose) in your blood, so it is important for you to know the amount that is okay for you to have in every meal. Carbohydrate counting helps keep the level of glucose in your blood within normal limits. The amount of carbohydrates allowed is different for every person. A dietitian can help you calculate the amount that is right for you. Once you know the amount of carbohydrates you can have, you can count the carbohydrates in the foods you want to eat. Carbohydrates are found in the following foods:  Grains, such as breads and cereals.  Dried beans and soy products.  Starchy vegetables, such as potatoes, peas, and corn.  Fruit and fruit juices.  Milk and yogurt.  Sweets and snack foods, such as cake, cookies, candy, chips, soft drinks, and fruit drinks. CARBOHYDRATE COUNTING There are two ways to count the carbohydrates in your food. You can use either of the methods or a combination of both. Reading the "Nutrition Facts" on [place] The "Nutrition Facts" is an area that is included on the labels of almost all packaged food and beverages in the [place]. It includes the serving size of that food or beverage and information about the nutrients in each serving of the food, including the grams (g) of carbohydrate per serving.  Decide the number of servings of this food or beverage that you will be able to eat or drink. Multiply that number of servings by the number of grams of carbohydrate that is listed on the label for that serving. The total will be the amount of carbohydrates you will be having when you eat or drink this food or beverage. Learning Standard Serving Sizes of Food When you eat food that is not packaged or does not include "Nutrition Facts" on the  label, you need to measure the servings in order to count the amount of carbohydrates.A serving of most carbohydrate-rich foods contains about 15 g of carbohydrates. The following list includes serving sizes of carbohydrate-rich foods that provide 15 g ofcarbohydrate per serving:   1 slice of bread (1 oz) or 1 six-inch tortilla.    of a hamburger bun or English muffin.  4-6 crackers.   cup unsweetened dry cereal.    cup hot cereal.   cup rice or pasta.    cup mashed potatoes or  of a large baked potato.  1 cup fresh fruit or one small piece of fruit.    cup canned or frozen fruit or fruit juice.  1 cup milk.   cup plain fat-free yogurt or yogurt sweetened with artificial sweeteners.   cup cooked dried beans or starchy vegetable, such as peas, corn, or potatoes.  Decide the number of standard-size servings that you will eat. Multiply that number of servings by 15 (the grams of carbohydrates in that serving). For example, if you eat 2 cups of strawberries, you will have eaten 2 servings and 30 g of carbohydrates (2 servings x 15 g = 30 g). For foods such as soups and casseroles, in which more than one food is mixed in, you will need to count the carbohydrates in each food that is included. EXAMPLE OF CARBOHYDRATE COUNTING Sample Dinner  3 oz chicken breast.   cup of brown rice.   cup of corn.  1 cup milk.  1 cup strawberries with sugar-free whipped topping.  Carbohydrate Calculation Step 1: Identify the foods that contain carbohydrates:   Rice.   Corn.   Milk.   Strawberries. Step 2:Calculate the number of servings eaten of each:   2 servings of rice.   1 serving of corn.   1 serving of milk.   1 serving of strawberries. Step 3: Multiply each of those number of servings by 15 g:   2 servings of rice x 15 g = 30 g.   1 serving of corn x 15 g = 15 g.   1 serving of milk x 15 g = 15 g.   1 serving of strawberries x 15 g = 15  g. Step 4: Add together all of the amounts to find the total grams of carbohydrates eaten: 30 g + 15 g + 15 g + 15 g = 75 g. Document Released: 06/20/2005 Document Revised: 11/04/2013 Document Reviewed: 05/17/2013 [facility] Patient Information 2015 [place], [place]. This information is not intended to replace advice given to you by your health care provider. Make sure you discuss any questions you have with your health care provider.

## 2014-12-19 NOTE — Progress Notes (Signed)
Pre visit review using our clinic review tool, if applicable. No additional management support is needed unless otherwise documented below in the visit note. 

## 2014-12-20 ENCOUNTER — Other Ambulatory Visit: Payer: Self-pay | Admitting: Family Medicine

## 2014-12-21 NOTE — Progress Notes (Signed)
Natalie Swanson  [id number] 22-Jun-1936 12/21/2014      Progress Note-Follow Up  Subjective  Chief Complaint  Chief Complaint  Patient presents with  . Follow-up    HPI  Patient is a 79 y.o. female in today for routine medical care. Patient is in today for follow-up. Overall feels well. No recent illness. Denies polyuria or polydipsia. Has been trying to maintain a heart healthy diet and stay active. Denies CP/palp/SOB/HA/congestion/fevers/GI or GU c/o. Taking meds as prescribed  Past Medical History  Diagnosis Date  . Shingles 9-12  . Chicken pox as a child  . Flashers or floaters, right eye 9-12    floaters  . Mumps as a child  . Measles as a child  . Cataract 07-04-2006    b/l  . Hypertension   . Hyperlipidemia   . Atopic dermatitis 01/11/2012  . Hyperglycemia 07/18/2012  . History of tobacco abuse 01/11/2012  . Medicare annual wellness visit, subsequent 04/17/2011    G4P1 s/p 1 svd, no abnormal papas, menarche at 31 menopause at 55, regular   . Pain in joint, ankle and foot 04/27/2014    right  . Vitamin D deficiency 09/14/2014  . Hx of adenomatous colonic polyps 11/07/2014    Past Surgical History  Procedure Laterality Date  . Cataract extraction w/ intraocular lens  implant, bilateral Bilateral 07-04-2006  . Cesarean section  1981    X 1    Family History  Problem Relation Age of Onset  . Hyperlipidemia Mother   . Hypertension Mother   . Heart disease Mother     MI  . Heart attack Father   . Diabetes Father     type 2  . Heart attack Brother   . Heart disease Brother 18    MI  . Hemophilia Son     x 2  . Hypertension Son   . Colon cancer Neg Hx   . Stomach cancer Neg Hx   . Cancer Father     type unknown    History   Social History  . Marital Status: Married    Spouse Name: N/A  . Number of Children: 4  . Years of Education: N/A   Occupational History  . retired    Social History Main Topics  . Smoking status: Former Smoker -- 0.20  packs/day for 61 years    Types: Cigarettes  . Smokeless tobacco: Never Used     Comment: uses e cigs ; quit smoking cigarettes 2013.  Marland Kitchen Alcohol Use: No  . Drug Use: No  . Sexual Activity: No     Comment: no dietary restrictions. lives with self.    Other Topics Concern  . Not on file   Social History Narrative    Current Outpatient Prescriptions on File Prior to Visit  Medication Sig Dispense Refill  . Cholecalciferol (D3-1000 PO) Take 1 tablet by mouth daily.      . fish oil-omega-3 fatty acids 1000 MG capsule Take 2 g by mouth daily.       No current facility-administered medications on file prior to visit.    Allergies  Allergen Reactions  . Sulfa Antibiotics Rash    Review of Systems  Review of Systems  Constitutional: Negative for fever and malaise/fatigue.  HENT: Negative for congestion.   Eyes: Negative for discharge.  Respiratory: Negative for shortness of breath.   Cardiovascular: Negative for chest pain, palpitations and leg swelling.  Gastrointestinal: Negative for nausea, abdominal pain and diarrhea.  Genitourinary: Negative for dysuria.  Musculoskeletal: Negative for falls.  Skin: Negative for rash.  Neurological: Negative for loss of consciousness and headaches.  Endo/Heme/Allergies: Negative for polydipsia.  Psychiatric/Behavioral: Negative for depression and suicidal ideas. The patient is not nervous/anxious and does not have insomnia.     Objective  BP 120/72 mmHg  Pulse 53  Temp(Src) 97.7 F (36.5 C) (Oral)  Ht 5' (1.524 m)  Wt 142 lb 2 oz (64.467 kg)  BMI 27.76 kg/m2  SpO2 96%  Physical Exam  Physical Exam  Constitutional: She is oriented to person, place, and time and well-developed, well-nourished, and in no distress. No distress.  HENT:  Head: Normocephalic and atraumatic.  Eyes: Conjunctivae are normal.  Neck: Neck supple. No thyromegaly present.  Cardiovascular: Normal rate, regular rhythm and normal heart sounds.   No murmur  heard. Pulmonary/Chest: Effort normal and breath sounds normal. She has no wheezes.  Abdominal: She exhibits no distension and no mass.  Musculoskeletal: She exhibits no edema.  Lymphadenopathy:    She has no cervical adenopathy.  Neurological: She is alert and oriented to person, place, and time.  Skin: Skin is warm and dry. No rash noted. She is not diaphoretic.  Psychiatric: Memory, affect and judgment normal.    Lab Results  Component Value Date   TSH 2.05 12/10/2014   Lab Results  Component Value Date   WBC 7.3 12/10/2014   HGB 13.0 12/10/2014   HCT 39.6 12/10/2014   MCV 93.3 12/10/2014   PLT 197.0 12/10/2014   Lab Results  Component Value Date   CREATININE 0.70 12/10/2014   BUN 9 12/10/2014   NA 137 12/10/2014   K 3.9 12/10/2014   CL 103 12/10/2014   CO2 29 12/10/2014   Lab Results  Component Value Date   ALT 33 12/10/2014   AST 31 12/10/2014   ALKPHOS 79 12/10/2014   BILITOT 0.5 12/10/2014   Lab Results  Component Value Date   CHOL 159 12/10/2014   Lab Results  Component Value Date   HDL 52.60 12/10/2014   Lab Results  Component Value Date   LDLCALC 72 12/10/2014   Lab Results  Component Value Date   TRIG 172.0* 12/10/2014   Lab Results  Component Value Date   CHOLHDL 3 12/10/2014     Assessment & Plan  Hypertension Well controlled, no changes to meds. Encouraged heart healthy diet such as the DASH diet and exercise as tolerated.   Hyperlipidemia Tolerating statin, encouraged heart healthy diet, avoid trans fats, minimize simple carbs and saturated fats. Increase exercise as tolerated  Hyperglycemia minimize simple carbs. Increase exercise as tolerated.   Vitamin D deficiency Vit d wnl, continue supplements

## 2014-12-21 NOTE — Assessment & Plan Note (Signed)
Tolerating statin, encouraged heart healthy diet, avoid trans fats, minimize simple carbs and saturated fats. Increase exercise as tolerated 

## 2014-12-21 NOTE — Assessment & Plan Note (Signed)
Vit d wnl, continue supplements

## 2014-12-21 NOTE — Assessment & Plan Note (Signed)
minimize simple carbs. Increase exercise as tolerated.  

## 2014-12-21 NOTE — Assessment & Plan Note (Signed)
Well controlled, no changes to meds. Encouraged heart healthy diet such as the DASH diet and exercise as tolerated.  °

## 2015-03-20 ENCOUNTER — Other Ambulatory Visit (INDEPENDENT_AMBULATORY_CARE_PROVIDER_SITE_OTHER): Payer: Commercial Managed Care - HMO

## 2015-03-20 DIAGNOSIS — E559 Vitamin D deficiency, unspecified: Secondary | ICD-10-CM

## 2015-03-20 DIAGNOSIS — E785 Hyperlipidemia, unspecified: Secondary | ICD-10-CM

## 2015-03-20 DIAGNOSIS — I10 Essential (primary) hypertension: Secondary | ICD-10-CM

## 2015-03-20 DIAGNOSIS — R739 Hyperglycemia, unspecified: Secondary | ICD-10-CM | POA: Diagnosis not present

## 2015-03-20 LAB — COMPREHENSIVE METABOLIC PANEL
ALT: 36 U/L — ABNORMAL HIGH (ref 0–35)
AST: 44 U/L — ABNORMAL HIGH (ref 0–37)
Albumin: 3.9 g/dL (ref 3.5–5.2)
Alkaline Phosphatase: 81 U/L (ref 39–117)
BUN: 7 mg/dL (ref 6–23)
CO2: 29 mEq/L (ref 19–32)
Calcium: 9.4 mg/dL (ref 8.4–10.5)
Chloride: 101 mEq/L (ref 96–112)
Creatinine, Ser: 0.71 mg/dL (ref 0.40–1.20)
GFR: 84.82 mL/min (ref >60.00)
Glucose, Bld: 98 mg/dL (ref 70–99)
Potassium: 4.1 mEq/L (ref 3.5–5.1)
Sodium: 137 mEq/L (ref 135–145)
Total Bilirubin: 0.5 mg/dL (ref 0.2–1.2)
Total Protein: 6.9 g/dL (ref 6.0–8.3)

## 2015-03-20 LAB — CBC
HCT: 40.2 % (ref 36.0–46.0)
Hemoglobin: 13.3 g/dL (ref 12.0–15.0)
MCHC: 33 g/dL (ref 30.0–36.0)
MCV: 92.9 fl (ref 78.0–100.0)
Platelets: 201 10*3/uL (ref 150.0–400.0)
RBC: 4.33 Mil/uL (ref 3.87–5.11)
RDW: 14.4 % (ref 11.5–15.5)
WBC: 7.7 10*3/uL (ref 4.0–10.5)

## 2015-03-20 LAB — LIPID PANEL
Cholesterol: 138 mg/dL (ref 0–200)
HDL: 53.9 mg/dL (ref >39.00)
LDL Cholesterol: 55 mg/dL (ref 0–99)
NonHDL: 83.76
Total CHOL/HDL Ratio: 3
Triglycerides: 144 mg/dL (ref 0.0–149.0)
VLDL: 28.8 mg/dL (ref 0.0–40.0)

## 2015-03-20 LAB — TSH: TSH: 1.46 u[IU]/mL (ref 0.35–4.50)

## 2015-03-20 LAB — HEMOGLOBIN A1C: Hgb A1c MFr Bld: 6.4 % (ref 4.6–6.5)

## 2015-03-20 LAB — VITAMIN D 25 HYDROXY (VIT D DEFICIENCY, FRACTURES): VITD: 43.61 ng/mL (ref 30.00–100.00)

## 2015-03-27 ENCOUNTER — Ambulatory Visit (INDEPENDENT_AMBULATORY_CARE_PROVIDER_SITE_OTHER): Payer: Commercial Managed Care - HMO | Admitting: Family Medicine

## 2015-03-27 ENCOUNTER — Encounter: Payer: Self-pay | Admitting: Family Medicine

## 2015-03-27 VITALS — BP 130/60 | HR 57 | Temp 98.1°F | Resp 16 | Ht 60.0 in | Wt 139.4 lb

## 2015-03-27 DIAGNOSIS — R945 Abnormal results of liver function studies: Secondary | ICD-10-CM

## 2015-03-27 DIAGNOSIS — E785 Hyperlipidemia, unspecified: Secondary | ICD-10-CM

## 2015-03-27 DIAGNOSIS — E559 Vitamin D deficiency, unspecified: Secondary | ICD-10-CM

## 2015-03-27 DIAGNOSIS — Z23 Encounter for immunization: Secondary | ICD-10-CM

## 2015-03-27 DIAGNOSIS — I10 Essential (primary) hypertension: Secondary | ICD-10-CM | POA: Diagnosis not present

## 2015-03-27 DIAGNOSIS — R739 Hyperglycemia, unspecified: Secondary | ICD-10-CM

## 2015-03-27 DIAGNOSIS — R7989 Other specified abnormal findings of blood chemistry: Secondary | ICD-10-CM

## 2015-03-27 NOTE — Progress Notes (Signed)
Pre visit review using our clinic review tool, if applicable. No additional management support is needed unless otherwise documented below in the visit note. 

## 2015-03-27 NOTE — Patient Instructions (Signed)
Fatty Liver  Fatty liver is the accumulation of fat in liver cells. It is also called hepatosteatosis or steatohepatitis. It is normal for your liver to contain some fat. If fat is more than 5 to 10% of your liver's weight, you have fatty liver.   There are often no symptoms (problems) for years while damage is still occurring. People often learn about their fatty liver when they have medical tests for other reasons. Fat can damage your liver for years or even decades without causing problems. When it becomes severe, it can cause fatigue, weight loss, weakness, and confusion.  This makes you more likely to develop more serious liver problems. The liver is the largest organ in the body. It does a lot of work and often gives no warning signs when it is sick until late in a disease.  The liver has many important jobs including:  · Breaking down foods.  · Storing vitamins, iron, and other minerals.  · Making proteins.  · Making bile for food digestion.  · Breaking down many products including medications, alcohol and some poisons.  CAUSES   There are a number of different conditions, medications, and poisons that can cause a fatty liver. Eating too many calories causes fat to build up in the liver. Not processing and breaking fats down normally may also cause this. Certain conditions, such as obesity, diabetes, and high triglycerides also cause this. Most fatty liver patients tend to be middle-aged and over weight.   Some causes of fatty liver are:  · Alcohol over consumption.  · Malnutrition.  · Steroid use.  · Valproic acid toxicity.  · Obesity.  · Cushing's syndrome.  · Poisons.  · Tetracycline in high dosages.  · Pregnancy.  · Diabetes.  · Hyperlipidemia.  · Rapid weight loss.  Some people develop fatty liver even having none of these conditions.  SYMPTOMS   Fatty liver most often causes no problems. This is called asymptomatic.  · It can be diagnosed with blood tests and also by a liver biopsy.  · It is one of the  most common causes of minor elevations of liver enzymes on routine blood tests.  · Specialized Imaging of the liver using ultrasound, CT (computed tomography) scan, or MRI (magnetic resonance imaging) can suggest a fatty liver but a biopsy is needed to confirm it.  · A biopsy involves taking a small sample of liver tissue. This is done by using a needle. It is then looked at under a microscope by a specialist.  TREATMENT   It is important to treat the cause. Simple fatty liver without a medical reason may not need treatment.  · Weight loss, fat restriction, and exercise in overweight patients produces inconsistent results but is worth trying.  · Fatty liver due to alcohol toxicity may not improve even with stopping drinking.  · Good control of diabetes may reduce fatty liver.  · Lower your triglycerides through diet, medication or both.  · Eat a balanced, healthy diet.  · Increase your physical activity.  · Get regular checkups from a liver specialist.  · There are no medical or surgical treatments for a fatty liver or NASH, but improving your diet and increasing your exercise may help prevent or reverse some of the damage.  PROGNOSIS   Fatty liver may cause no damage or it can lead to an inflammation of the liver. This is, called steatohepatitis. When it is linked to alcohol abuse, it is called alcoholic steatohepatitis. It often   is not linked to alcohol. It is then called nonalcoholic steatohepatitis, or NASH. Over time the liver may become scarred and hardened. This condition is called cirrhosis. Cirrhosis is serious and may lead to liver failure or cancer. NASH is one of the leading causes of cirrhosis. About 10-20% of Americans have fatty liver and a smaller 2-5% has NASH.  Document Released: 08/05/2005 Document Revised: 09/12/2011 Document Reviewed: 10/30/2013  [employer]® Patient Information ©2015 [employer]. This information is not intended to replace advice given to you by your health care provider. Make  sure you discuss any questions you have with your health care provider.

## 2015-03-27 NOTE — Progress Notes (Signed)
Patient ID: Natalie Swanson, female   DOB: [birth year], 79 y.o.   MRN: [id number]   Subjective:    Patient ID: Natalie Swanson, female    DOB: 1936-03-11, 78 y.o.   MRN: [id number]  Chief Complaint  Patient presents with  . Follow-up    HPI Patient is in today for follow-up. She feels well. She's been exercising regularly to workout video. She has been trying to maintain a heart healthy diet. She denies any recent illness or acute concerns. Denies CP/palp/SOB/HA/congestion/fevers/GI or GU c/o. Taking meds as prescribed  Past Medical History  Diagnosis Date  . Shingles 9-12  . Chicken pox as a child  . Flashers or floaters, right eye 9-12    floaters  . Mumps as a child  . Measles as a child  . Cataract 07-04-2006    b/l  . Hypertension   . Hyperlipidemia   . Atopic dermatitis 01/11/2012  . Hyperglycemia 07/18/2012  . History of tobacco abuse 01/11/2012  . Medicare annual wellness visit, subsequent 04/17/2011    G4P1 s/p 1 svd, no abnormal papas, menarche at 75 menopause at 88, regular   . Pain in joint, ankle and foot 04/27/2014    right  . Vitamin D deficiency 09/14/2014  . Hx of adenomatous colonic polyps 11/07/2014  . Abnormal liver function tests 04/03/2015    Past Surgical History  Procedure Laterality Date  . Cataract extraction w/ intraocular lens  implant, bilateral Bilateral 07-04-2006  . Cesarean section  1981    X 1    Family History  Problem Relation Age of Onset  . Hyperlipidemia Mother   . Hypertension Mother   . Heart disease Mother     MI  . Heart attack Father   . Diabetes Father     type 2  . Heart attack Brother   . Heart disease Brother 77    MI  . Hemophilia Son     x 2  . Hypertension Son   . Colon cancer Neg Hx   . Stomach cancer Neg Hx   . Cancer Father     type unknown    Social History   Social History  . Marital Status: Married    Spouse Name: N/A  . Number of Children: 4  . Years of Education: N/A   Occupational History  .  retired    Social History Main Topics  . Smoking status: Former Smoker -- 0.20 packs/day for 61 years    Types: Cigarettes  . Smokeless tobacco: Never Used     Comment: uses e cigs ; quit smoking cigarettes 2013.  Marland Kitchen Alcohol Use: No  . Drug Use: No  . Sexual Activity: No     Comment: no dietary restrictions. lives with self.    Other Topics Concern  . Not on file   Social History Narrative    Outpatient Prescriptions Prior to Visit  Medication Sig Dispense Refill  . Cholecalciferol (D3-1000 PO) Take 5 tablets by mouth daily.     . fish oil-omega-3 fatty acids 1000 MG capsule Take 2 g by mouth daily.      . propranolol (INDERAL) 40 MG tablet Take 1 tablet (40 mg total) by mouth 2 (two) times daily. 180 tablet 2  . simvastatin (ZOCOR) 40 MG tablet Take 1 tablet (40 mg total) by mouth at bedtime. 90 tablet 2  . Vitamin D, Ergocalciferol, (DRISDOL) 50000 UNITS CAPS capsule TAKE 1 CAPSULE (50,000 UNITS TOTAL) BY MOUTH EVERY 7 (SEVEN) DAYS. (  Patient not taking: Reported on 03/27/2015) 4 capsule 3   No facility-administered medications prior to visit.    Allergies  Allergen Reactions  . Sulfa Antibiotics Rash    Review of Systems  Constitutional: Negative for fever and malaise/fatigue.  HENT: Negative for congestion.   Eyes: Negative for discharge.  Respiratory: Negative for shortness of breath.   Cardiovascular: Negative for chest pain, palpitations and leg swelling.  Gastrointestinal: Negative for nausea and abdominal pain.  Genitourinary: Negative for dysuria.  Musculoskeletal: Negative for falls.  Skin: Negative for rash.  Neurological: Negative for loss of consciousness and headaches.  Endo/Heme/Allergies: Negative for environmental allergies.  Psychiatric/Behavioral: Negative for depression. The patient is not nervous/anxious.        Objective:    Physical Exam  Constitutional: She is oriented to person, place, and time. She appears well-developed and well-nourished.  No distress.  HENT:  Head: Normocephalic and atraumatic.  Nose: Nose normal.  Eyes: Right eye exhibits no discharge. Left eye exhibits no discharge.  Neck: Normal range of motion. Neck supple.  Cardiovascular: Normal rate and regular rhythm.   No murmur heard. Pulmonary/Chest: Effort normal and breath sounds normal.  Abdominal: Soft. Bowel sounds are normal. There is no tenderness.  Musculoskeletal: She exhibits no edema.  Neurological: She is alert and oriented to person, place, and time.  Skin: Skin is warm and dry.  Psychiatric: She has a normal mood and affect.  Nursing note and vitals reviewed.   BP 130/60 mmHg  Pulse 57  Temp(Src) 98.1 F (36.7 C) (Oral)  Resp 16  Ht 5' (1.524 m)  Wt 139 lb 6.4 oz (63.231 kg)  BMI 27.22 kg/m2  SpO2 98% Wt Readings from Last 3 Encounters:  03/27/15 139 lb 6.4 oz (63.231 kg)  12/19/14 142 lb 2 oz (64.467 kg)  11/07/14 142 lb (64.411 kg)     Lab Results  Component Value Date   WBC 7.7 03/20/2015   HGB 13.3 03/20/2015   HCT 40.2 03/20/2015   PLT 201.0 03/20/2015   GLUCOSE 98 03/20/2015   CHOL 138 03/20/2015   TRIG 144.0 03/20/2015   HDL 53.90 03/20/2015   LDLDIRECT 146.8 07/13/2012   LDLCALC 55 03/20/2015   ALT 36* 03/20/2015   AST 44* 03/20/2015   NA 137 03/20/2015   K 4.1 03/20/2015   CL 101 03/20/2015   CREATININE 0.71 03/20/2015   BUN 7 03/20/2015   CO2 29 03/20/2015   TSH 1.46 03/20/2015   HGBA1C 6.4 03/20/2015    Lab Results  Component Value Date   TSH 1.46 03/20/2015   Lab Results  Component Value Date   WBC 7.7 03/20/2015   HGB 13.3 03/20/2015   HCT 40.2 03/20/2015   MCV 92.9 03/20/2015   PLT 201.0 03/20/2015   Lab Results  Component Value Date   NA 137 03/20/2015   K 4.1 03/20/2015   CO2 29 03/20/2015   GLUCOSE 98 03/20/2015   BUN 7 03/20/2015   CREATININE 0.71 03/20/2015   BILITOT 0.5 03/20/2015   ALKPHOS 81 03/20/2015   AST 44* 03/20/2015   ALT 36* 03/20/2015   PROT 6.9 03/20/2015    ALBUMIN 3.9 03/20/2015   CALCIUM 9.4 03/20/2015   GFR 84.82 03/20/2015   Lab Results  Component Value Date   CHOL 138 03/20/2015   Lab Results  Component Value Date   HDL 53.90 03/20/2015   Lab Results  Component Value Date   LDLCALC 55 03/20/2015   Lab Results  Component Value Date  TRIG 144.0 03/20/2015   Lab Results  Component Value Date   CHOLHDL 3 03/20/2015   Lab Results  Component Value Date   HGBA1C 6.4 03/20/2015       Assessment & Plan:   Problem List Items Addressed This Visit    Vitamin D deficiency    Well treated, continue supplements      Relevant Orders   CBC   TSH   Vit D  25 hydroxy (rtn osteoporosis monitoring)   Hemoglobin A1c   Lipid panel   Comprehensive metabolic panel   Hypertension    Well controlled, no changes to meds. Encouraged heart healthy diet such as the DASH diet and exercise as tolerated.       Relevant Orders   CBC   TSH   Vit D  25 hydroxy (rtn osteoporosis monitoring)   Hemoglobin A1c   Lipid panel   Comprehensive metabolic panel   Hyperlipidemia    Tolerating statin, encouraged heart healthy diet, avoid trans fats, minimize simple carbs and saturated fats. Increase exercise as tolerated      Relevant Orders   CBC   TSH   Vit D  25 hydroxy (rtn osteoporosis monitoring)   Hemoglobin A1c   Lipid panel   Comprehensive metabolic panel   Hyperglycemia    hgba1c acceptable, minimize simple carbs. Increase exercise as tolerated.      Relevant Orders   CBC   TSH   Vit D  25 hydroxy (rtn osteoporosis monitoring)   Hemoglobin A1c   Lipid panel   Comprehensive metabolic panel   Abnormal liver function tests    Mild, likely fatty liver. Minimize simple carbs and saturated fats       Other Visit Diagnoses    Encounter for immunization    -  Primary       I am having Ms. Colver maintain her fish oil-omega-3 fatty acids, Cholecalciferol (D3-1000 PO), propranolol, simvastatin, and Vitamin D  (Ergocalciferol).  No orders of the defined types were placed in this encounter.     Penni Homans, MD

## 2015-04-03 ENCOUNTER — Encounter: Payer: Self-pay | Admitting: Family Medicine

## 2015-04-03 DIAGNOSIS — R7989 Other specified abnormal findings of blood chemistry: Secondary | ICD-10-CM

## 2015-04-03 DIAGNOSIS — R945 Abnormal results of liver function studies: Secondary | ICD-10-CM

## 2015-04-03 HISTORY — DX: Abnormal results of liver function studies: R94.5

## 2015-04-03 HISTORY — DX: Other specified abnormal findings of blood chemistry: R79.89

## 2015-04-03 NOTE — Assessment & Plan Note (Signed)
hgba1c acceptable, minimize simple carbs. Increase exercise as tolerated.  

## 2015-04-03 NOTE — Assessment & Plan Note (Signed)
Mild, likely fatty liver. Minimize simple carbs and saturated fats

## 2015-04-03 NOTE — Assessment & Plan Note (Signed)
Tolerating statin, encouraged heart healthy diet, avoid trans fats, minimize simple carbs and saturated fats. Increase exercise as tolerated 

## 2015-04-03 NOTE — Assessment & Plan Note (Signed)
Well controlled, no changes to meds. Encouraged heart healthy diet such as the DASH diet and exercise as tolerated.  °

## 2015-04-03 NOTE — Assessment & Plan Note (Signed)
Well treated, continue supplements

## 2015-06-24 ENCOUNTER — Telehealth: Payer: Self-pay

## 2015-06-24 NOTE — Telephone Encounter (Signed)
Left message reminding patient of Med Wellness appointment date and time.Marland Kitchen

## 2015-06-25 ENCOUNTER — Ambulatory Visit: Payer: Self-pay | Admitting: Family Medicine

## 2015-06-25 ENCOUNTER — Ambulatory Visit (INDEPENDENT_AMBULATORY_CARE_PROVIDER_SITE_OTHER): Payer: Commercial Managed Care - HMO

## 2015-06-25 ENCOUNTER — Telehealth: Payer: Self-pay | Admitting: Medical

## 2015-06-25 VITALS — BP 110/68 | HR 56 | Ht 59.5 in | Wt 138.4 lb

## 2015-06-25 DIAGNOSIS — Z23 Encounter for immunization: Secondary | ICD-10-CM

## 2015-06-25 DIAGNOSIS — Z0000 Encounter for general adult medical examination without abnormal findings: Secondary | ICD-10-CM

## 2015-06-25 NOTE — Progress Notes (Addendum)
Subjective:   Natalie Swanson is a 79 y.o. female who presents for Medicare Annual (Subsequent) preventive examination.    Review of Systems: No ROS Cardiac Risk Factors include: advanced age (>81men, >8 women);hypertension  Sleep patterns:  Sleeps at least 6-7 hours per night/wakes up occasionally to use the bathroom.   Home Safety/Smoke Alarms: Feels safe at home.  Lives alone in 1 level home.  Smoke alarms present.   Firearm Safety:  Kept in safe place.   Seat Belt Safety/Bike Helmet:  Always wears seat belt.    Counseling:   Eye Exam- "years ago." Encouraged to schedule an appointment. Dental-  "it's been awhile."  Encouraged to schedule an appointment.   Female:  Pap-06/07/11-ASCUS    Mammo-Never had one.   Dexa scan-08/19/14-osteopenia     CCS-11/07/14-2 polys found; no repeat-Dr. Carlean Purl.    Received Pneumonia 23 vaccine today.    Immunizations UTD with the exception of Zostavax.  Zostavax postponed.      Objective:     Vitals: BP 110/68 mmHg  Pulse 56  Ht 4' 11.5" (1.511 m)  Wt 138 lb 6.4 oz (62.778 kg)  BMI 27.50 kg/m2  SpO2 96%  Tobacco History  Smoking status  . Former Smoker -- 0.20 packs/day for 61 years  . Types: Cigarettes  Smokeless tobacco  . Never Used    Comment: uses e cigs ; quit smoking cigarettes 2013.     Counseling given: No   Past Medical History  Diagnosis Date  . Shingles 9-12  . Chicken pox as a child  . Flashers or floaters, right eye 9-12    floaters  . Mumps as a child  . Measles as a child  . Cataract 07-04-2006    b/l  . Hypertension   . Hyperlipidemia   . Atopic dermatitis 01/11/2012  . Hyperglycemia 07/18/2012  . History of tobacco abuse 01/11/2012  . Medicare annual wellness visit, subsequent 04/17/2011    G4P1 s/p 1 svd, no abnormal papas, menarche at 12 menopause at 40, regular   . Pain in joint, ankle and foot 04/27/2014    right  . Vitamin D deficiency 09/14/2014  . Hx of adenomatous colonic polyps 11/07/2014  .  Abnormal liver function tests 04/03/2015   Past Surgical History  Procedure Laterality Date  . Cataract extraction w/ intraocular lens  implant, bilateral Bilateral 07-04-2006  . Cesarean section  1981    X 1   Family History  Problem Relation Age of Onset  . Hyperlipidemia Mother   . Hypertension Mother   . Heart disease Mother     MI  . Heart attack Father   . Diabetes Father     type 2  . Heart attack Brother   . Heart disease Brother 43    MI  . Hemophilia Son     x 2  . Hypertension Son   . Colon cancer Neg Hx   . Stomach cancer Neg Hx   . Cancer Father     type unknown   History  Sexual Activity  . Sexual Activity: No    Comment: no dietary restrictions. lives with self.     Outpatient Encounter Prescriptions as of 06/25/2015  Medication Sig  . Cholecalciferol (VITAMIN D3) 5000 UNITS CAPS Take by mouth.  . fish oil-omega-3 fatty acids 1000 MG capsule Take 2 g by mouth daily.    . propranolol (INDERAL) 40 MG tablet Take 1 tablet (40 mg total) by mouth 2 (two) times daily.  Marland Kitchen  simvastatin (ZOCOR) 40 MG tablet Take 1 tablet (40 mg total) by mouth at bedtime.  . [DISCONTINUED] Cholecalciferol (D3-1000 PO) Take 5 tablets by mouth daily. Reported on 06/25/2015  . [DISCONTINUED] Vitamin D, Ergocalciferol, (DRISDOL) 50000 UNITS CAPS capsule TAKE 1 CAPSULE (50,000 UNITS TOTAL) BY MOUTH EVERY 7 (SEVEN) DAYS. (Patient not taking: Reported on 03/27/2015)   No facility-administered encounter medications on file as of 06/25/2015.    Activities of Daily Living In your present state of health, do you have any difficulty performing the following activities: 06/25/2015 09/09/2014  Hearing? N N  Vision? N N  Difficulty concentrating or making decisions? N N  Walking or climbing stairs? N N  Dressing or bathing? N N  Doing errands, shopping? N N  Preparing Food and eating ? N -  Using the Toilet? N -  In the past six months, have you accidently leaked urine? Y -  Do you have  problems with loss of bowel control? N -  Managing your Medications? N -  Managing your Finances? N -  Housekeeping or managing your Housekeeping? N -    Patient Care Team: Mosie Lukes, MD as PCP - General (Family Medicine) Gatha Mayer, MD as Consulting Physician (Gastroenterology)    Assessment:   Exercise Activities and Dietary recommendations Current Exercise Habits:: Structured exercise class, Type of exercise: stretching;strength training/weights, Time (Minutes): 60, Frequency (Times/Week): 5, Weekly Exercise (Minutes/Week): 300   Diet: Regular diet. Eats 3 meals per day.  Mostly salad.  Occasionally meat.  3 cups of coffee in the morning, plenty of water during the day.  Drinks 4-5 cups of milk per day.  Limiting sugar intake.    Goals    . Remain active and independent.     Healthy diet, exercise, and regular check up with PCP.        Fall Risk Fall Risk  06/25/2015 04/18/2014  Falls in the past year? No No   Depression Screen PHQ 2/9 Scores 06/25/2015 04/18/2014  PHQ - 2 Score 0 0     Cognitive Testing MMSE - Mini Mental State Exam 06/25/2015  Orientation to time 5  Orientation to Place 5  Registration 3  Attention/ Calculation 5  Recall 3  Language- name 2 objects 2  Language- repeat 1  Language- follow 3 step command 3  Language- read & follow direction 1  Write a sentence 1  Copy design 1  Total score 30    Immunization History  Administered Date(s) Administered  . Influenza Split 04/06/2011, 07/18/2012  . Influenza,inj,Quad PF,36+ Mos 03/13/2013, 04/18/2014, 03/27/2015  . Pneumococcal Conjugate-13 07/26/2013  . Pneumococcal Polysaccharide-23 06/25/2015  . Tdap 04/06/2011   Screening Tests Health Maintenance  Topic Date Due  . PNA vac Low Risk Adult (2 of 2 - PPSV23) 07/26/2014  . ZOSTAVAX  07/04/2015 (Originally 02/12/1998)  . INFLUENZA VACCINE  02/02/2016  . TETANUS/TDAP  04/05/2021  . DEXA SCAN  Completed      Plan:  Bring in a  copy of your advanced directive at next visit.  Continue to eat heart healthy diet (full of fruits, vegetables, whole grains, lean protein, water--limit salt, fat, caffeine, and sugar intake) and continue to stay active as possible.  Continue doing brain stimulating activities (puzzles, reading, adult coloring books, staying active) to keep memory sharp.   Consider scheduling mammogram.  Schedule eye and dental exam.    Follow up with Dr. Charlett Blake next month as scheduled.     During the course of  the visit the patient was educated and counseled about the following appropriate screening and preventive services:   Vaccines to include Pneumoccal, Influenza, Hepatitis B, Td, Zostavax, HCV  Electrocardiogram  Cardiovascular Disease  Colorectal cancer screening  Bone density screening  Diabetes screening  Glaucoma screening  Mammography/PAP  Nutrition counseling   Patient Instructions (the written plan) was given to the patient.   Rudene Anda, RN  06/25/2015     I have reviewed and agreed with pt assessment and plan.

## 2015-06-25 NOTE — Progress Notes (Signed)
Pre visit review using our clinic review tool, if applicable. No additional management support is needed unless otherwise documented below in the visit note. 

## 2015-06-25 NOTE — Telephone Encounter (Signed)
Ashlee. At the bottom portion of pt wellness. Where you list vaccines and other things under the plan. Was any of those. Done today. Do you give vaccines and place orders for those things to get done if they agree and then we sign. Please let me know how that works. This is Dr. Charlett Blake pt. Will she follow up with Dr. Charlett Blake to review and fill in any gaps//

## 2015-06-25 NOTE — Patient Instructions (Addendum)
Bring in a copy of your advanced directive at next visit.  Continue to eat heart healthy diet (full of fruits, vegetables, whole grains, lean protein, water--limit salt, fat, caffeine, and sugar intake) and continue to stay active as possible.  Continue doing brain stimulating activities (puzzles, reading, adult coloring books, staying active) to keep memory sharp.   Consider scheduling mammogram.  Schedule eye and dental exam.    Follow up with Dr. Charlett Blake next month as scheduled.    Fat and Cholesterol Restricted Diet Getting too much fat and cholesterol in your diet may cause health problems. Following this diet helps keep your fat and cholesterol at normal levels. This can keep you from getting sick. WHAT TYPES OF FAT SHOULD I CHOOSE?  Choose monosaturated and polyunsaturated fats. These are found in foods such as olive oil, canola oil, flaxseeds, walnuts, almonds, and seeds.  Eat more omega-3 fats. Good choices include salmon, mackerel, sardines, tuna, flaxseed oil, and ground flaxseeds.  Limit saturated fats. These are in animal products such as meats, butter, and cream. They can also be in plant products such as palm oil, palm kernel oil, and coconut oil.   Avoid foods with partially hydrogenated oils in them. These contain trans fats. Examples of foods that have trans fats are stick margarine, some tub margarines, cookies, crackers, and other baked goods. WHAT GENERAL GUIDELINES DO I NEED TO FOLLOW?   Check food labels. Look for the words "trans fat" and "saturated fat."  When preparing a meal:  Fill half of your plate with vegetables and green salads.  Fill one fourth of your plate with whole grains. Look for the word "whole" as the first word in the ingredient list.  Fill one fourth of your plate with lean protein foods.  Limit fruit to two servings a day. Choose fruit instead of juice.  Eat more foods with soluble fiber. Examples of foods with this type of fiber are  apples, broccoli, carrots, beans, peas, and barley. Try to get 20-30 g (grams) of fiber per day.  Eat more home-cooked foods. Eat less at restaurants and buffets.  Limit or avoid alcohol.  Limit foods high in starch and sugar.  Limit fried foods.  Cook foods without frying them. Baking, boiling, grilling, and broiling are all great options.  Lose weight if you are overweight. Losing even a small amount of weight can help your overall health. It can also help prevent diseases such as diabetes and heart disease. WHAT FOODS CAN I EAT? Grains Whole grains, such as whole wheat or whole grain breads, crackers, cereals, and pasta. Unsweetened oatmeal, bulgur, barley, quinoa, or brown rice. Corn or whole wheat flour tortillas. Vegetables Fresh or frozen vegetables (raw, steamed, roasted, or grilled). Green salads. Fruits All fresh, canned (in natural juice), or frozen fruits. Meat and Other Protein Products Ground beef (85% or leaner), grass-fed beef, or beef trimmed of fat. Skinless chicken or [place]. Ground chicken or [place]. Pork trimmed of fat. All fish and seafood. Eggs. Dried beans, peas, or lentils. Unsalted nuts or seeds. Unsalted canned or dry beans. Dairy Low-fat dairy products, such as skim or 1% milk, 2% or reduced-fat cheeses, low-fat ricotta or cottage cheese, or plain low-fat yogurt. Fats and Oils Tub margarines without trans fats. Light or reduced-fat mayonnaise and salad dressings. Avocado. Olive, canola, sesame, or safflower oils. Natural peanut or almond butter (choose ones without added sugar and oil). The items listed above may not be a complete list of recommended foods or beverages.  Contact your dietitian for more options. WHAT FOODS ARE NOT RECOMMENDED? Grains White bread. White pasta. White rice. Cornbread. Bagels, pastries, and croissants. Crackers that contain trans fat. Vegetables White potatoes. Corn. Creamed or fried vegetables. Vegetables in a cheese  sauce. Fruits Dried fruits. Canned fruit in light or heavy syrup. Fruit juice. Meat and Other Protein Products Fatty cuts of meat. Ribs, chicken wings, bacon, sausage, bologna, salami, chitterlings, fatback, hot dogs, bratwurst, and packaged luncheon meats. Liver and organ meats. Dairy Whole or 2% milk, cream, half-and-half, and cream cheese. Whole milk cheeses. Whole-fat or sweetened yogurt. Full-fat cheeses. Nondairy creamers and whipped toppings. Processed cheese, cheese spreads, or cheese curds. Sweets and Desserts Corn syrup, sugars, honey, and molasses. Candy. Jam and jelly. Syrup. Sweetened cereals. Cookies, pies, cakes, donuts, muffins, and ice cream. Fats and Oils Butter, stick margarine, lard, shortening, ghee, or bacon fat. Coconut, palm kernel, or palm oils. Beverages Alcohol. Sweetened drinks (such as sodas, lemonade, and fruit drinks or punches). The items listed above may not be a complete list of foods and beverages to avoid. Contact your dietitian for more information.   This information is not intended to replace advice given to you by your health care provider. Make sure you discuss any questions you have with your health care provider.   Document Released: 12/20/2011 Document Revised: 07/11/2014 Document Reviewed: 09/19/2013 Elsevier Interactive Patient Education 2016 [place] A mammogram is an X-ray of the breasts that is done to check for changes that are not normal. This test can screen for and find any changes that may suggest breast cancer. This test can also help to find other changes and variations in the breast. BEFORE THE PROCEDURE  Have this test done about 1-2 weeks after your period. This is usually when your breasts are the least tender.  If you are visiting a new doctor or clinic, send any past mammogram images to your new doctor's office.  Wash your breasts and under your arms the day of the test.  Do not use deodorants, perfumes,  lotions, or powders on the day of the test.  Take off any jewelry from your neck.  Wear clothes that you can change into and out of easily. PROCEDURE  You will undress from the waist up. You will put on a gown.  You will stand in front of the X-ray machine.  Each breast will be placed between two plastic or glass plates. The plates will press down on your breast for a few seconds. Try to stay as relaxed as possible. This does not cause any harm to your breasts. Any discomfort you feel will be very brief.  X-rays will be taken from different angles of each breast. The procedure may vary among doctors and hospitals. AFTER THE PROCEDURE  The mammogram will be looked at by a specialist (radiologist).  You may need to do certain parts of the test again. This depends on the quality of the images.  Ask when your test results will be ready. Make sure you get your test results.  You may go back to your normal activities.   This information is not intended to replace advice given to you by your health care provider. Make sure you discuss any questions you have with your health care provider.   Document Released: 09/12/2011 Document Revised: 03/11/2015 Document Reviewed: 08/29/2014 Elsevier Interactive Patient Education [employer].

## 2015-06-26 NOTE — Telephone Encounter (Signed)
Pt received Pneumonia 23.  Order was placed.  Health Maintenance up to date with the exception of Zostavax.  Pt has a follow up appt with Dr. Charlett Blake scheduled on 07/27/15.

## 2015-07-23 ENCOUNTER — Other Ambulatory Visit: Payer: Self-pay

## 2015-07-23 ENCOUNTER — Other Ambulatory Visit (INDEPENDENT_AMBULATORY_CARE_PROVIDER_SITE_OTHER): Payer: Commercial Managed Care - HMO

## 2015-07-23 DIAGNOSIS — R739 Hyperglycemia, unspecified: Secondary | ICD-10-CM | POA: Diagnosis not present

## 2015-07-23 DIAGNOSIS — I10 Essential (primary) hypertension: Secondary | ICD-10-CM

## 2015-07-23 DIAGNOSIS — E785 Hyperlipidemia, unspecified: Secondary | ICD-10-CM

## 2015-07-23 DIAGNOSIS — E559 Vitamin D deficiency, unspecified: Secondary | ICD-10-CM | POA: Diagnosis not present

## 2015-07-23 LAB — HEMOGLOBIN A1C: Hgb A1c MFr Bld: 6.6 % — ABNORMAL HIGH (ref 4.6–6.5)

## 2015-07-23 LAB — LIPID PANEL
Cholesterol: 179 mg/dL (ref 0–200)
HDL: 54 mg/dL (ref >39.00)
LDL Cholesterol: 87 mg/dL (ref 0–99)
NonHDL: 125.24
Total CHOL/HDL Ratio: 3
Triglycerides: 192 mg/dL — ABNORMAL HIGH (ref 0.0–149.0)
VLDL: 38.4 mg/dL (ref 0.0–40.0)

## 2015-07-23 LAB — COMPREHENSIVE METABOLIC PANEL
ALT: 26 U/L (ref 0–35)
AST: 35 U/L (ref 0–37)
Albumin: 3.9 g/dL (ref 3.5–5.2)
Alkaline Phosphatase: 88 U/L (ref 39–117)
BUN: 10 mg/dL (ref 6–23)
CO2: 30 mEq/L (ref 19–32)
Calcium: 9.5 mg/dL (ref 8.4–10.5)
Chloride: 101 mEq/L (ref 96–112)
Creatinine, Ser: 0.73 mg/dL (ref 0.40–1.20)
GFR: 82.07 mL/min (ref >60.00)
Glucose, Bld: 109 mg/dL — ABNORMAL HIGH (ref 70–99)
Potassium: 4.3 mEq/L (ref 3.5–5.1)
Sodium: 137 mEq/L (ref 135–145)
Total Bilirubin: 0.4 mg/dL (ref 0.2–1.2)
Total Protein: 7.1 g/dL (ref 6.0–8.3)

## 2015-07-23 LAB — CBC
HCT: 43.3 % (ref 36.0–46.0)
Hemoglobin: 14.3 g/dL (ref 12.0–15.0)
MCHC: 33 g/dL (ref 30.0–36.0)
MCV: 92.5 fl (ref 78.0–100.0)
Platelets: 215 10*3/uL (ref 150.0–400.0)
RBC: 4.69 Mil/uL (ref 3.87–5.11)
RDW: 14.2 % (ref 11.5–15.5)
WBC: 9.3 10*3/uL (ref 4.0–10.5)

## 2015-07-23 LAB — TSH: TSH: 2.81 u[IU]/mL (ref 0.35–4.50)

## 2015-07-23 LAB — VITAMIN D 25 HYDROXY (VIT D DEFICIENCY, FRACTURES): VITD: 37.1 ng/mL (ref 30.00–100.00)

## 2015-07-27 ENCOUNTER — Encounter: Payer: Self-pay | Admitting: Family Medicine

## 2015-07-27 ENCOUNTER — Ambulatory Visit (INDEPENDENT_AMBULATORY_CARE_PROVIDER_SITE_OTHER): Payer: Commercial Managed Care - HMO | Admitting: Family Medicine

## 2015-07-27 VITALS — BP 120/72 | HR 58 | Temp 98.3°F | Ht 59.0 in | Wt 138.4 lb

## 2015-07-27 DIAGNOSIS — I10 Essential (primary) hypertension: Secondary | ICD-10-CM

## 2015-07-27 DIAGNOSIS — E785 Hyperlipidemia, unspecified: Secondary | ICD-10-CM | POA: Diagnosis not present

## 2015-07-27 DIAGNOSIS — R739 Hyperglycemia, unspecified: Secondary | ICD-10-CM

## 2015-07-27 DIAGNOSIS — E559 Vitamin D deficiency, unspecified: Secondary | ICD-10-CM | POA: Diagnosis not present

## 2015-07-27 DIAGNOSIS — R945 Abnormal results of liver function studies: Secondary | ICD-10-CM

## 2015-07-27 DIAGNOSIS — Z8619 Personal history of other infectious and parasitic diseases: Secondary | ICD-10-CM

## 2015-07-27 DIAGNOSIS — E119 Type 2 diabetes mellitus without complications: Secondary | ICD-10-CM

## 2015-07-27 DIAGNOSIS — R7989 Other specified abnormal findings of blood chemistry: Secondary | ICD-10-CM

## 2015-07-27 NOTE — Progress Notes (Signed)
Subjective:    Patient ID: Natalie Swanson, female    DOB: 04-07-1936, 80 y.o.   MRN: [id number]  Chief Complaint  Patient presents with  . Follow-up    HPI Patient is in today for follow up. Is feeling well today. No recent illness or hospitalization. Has not ben following a low carbohydrate diet. No polyuria or polydipsia. No acute concerns. Denies CP/palp/SOB/HA/congestion/fevers/GI or GU c/o. Taking meds as prescribed  Past Medical History  Diagnosis Date  . Shingles 9-12  . Chicken pox as a child  . Flashers or floaters, right eye 9-12    floaters  . Mumps as a child  . Measles as a child  . Cataract 07-04-2006    b/l  . Hypertension   . Hyperlipidemia   . Atopic dermatitis 01/11/2012  . Hyperglycemia 07/18/2012  . History of tobacco abuse 01/11/2012  . Medicare annual wellness visit, subsequent 04/17/2011    G4P1 s/p 1 svd, no abnormal papas, menarche at 27 menopause at 37, regular   . Pain in joint, ankle and foot 04/27/2014    right  . Vitamin D deficiency 09/14/2014  . Hx of adenomatous colonic polyps 11/07/2014  . Abnormal liver function tests 04/03/2015  . History of shingles   . DM type 2, goal A1c below 7 07/18/2012    Past Surgical History  Procedure Laterality Date  . Cataract extraction w/ intraocular lens  implant, bilateral Bilateral 07-04-2006  . Cesarean section  1981    X 1    Family History  Problem Relation Age of Onset  . Hyperlipidemia Mother   . Hypertension Mother   . Heart disease Mother     MI  . Heart attack Father   . Diabetes Father     type 2  . Heart attack Brother   . Heart disease Brother 57    MI  . Hemophilia Son     x 2  . Hypertension Son   . Colon cancer Neg Hx   . Stomach cancer Neg Hx   . Cancer Father     type unknown    Social History   Social History  . Marital Status: Married    Spouse Name: N/A  . Number of Children: 4  . Years of Education: N/A   Occupational History  . retired    Social History Main  Topics  . Smoking status: Former Smoker -- 0.20 packs/day for 61 years    Types: Cigarettes  . Smokeless tobacco: Never Used     Comment: uses e cigs ; quit smoking cigarettes 2013.  Marland Kitchen Alcohol Use: No  . Drug Use: No  . Sexual Activity: No     Comment: no dietary restrictions. lives with self.    Other Topics Concern  . Not on file   Social History Narrative    Outpatient Prescriptions Prior to Visit  Medication Sig Dispense Refill  . Cholecalciferol (VITAMIN D3) 5000 UNITS CAPS Take by mouth.    . fish oil-omega-3 fatty acids 1000 MG capsule Take 2 g by mouth daily.      . propranolol (INDERAL) 40 MG tablet Take 1 tablet (40 mg total) by mouth 2 (two) times daily. 180 tablet 2  . simvastatin (ZOCOR) 40 MG tablet Take 1 tablet (40 mg total) by mouth at bedtime. 90 tablet 2   No facility-administered medications prior to visit.    Allergies  Allergen Reactions  . Sulfa Antibiotics Rash    Review of Systems  Constitutional: Negative for fever and malaise/fatigue.  HENT: Negative for congestion.   Eyes: Negative for discharge.  Respiratory: Negative for shortness of breath.   Cardiovascular: Negative for chest pain, palpitations and leg swelling.  Gastrointestinal: Negative for nausea and abdominal pain.  Genitourinary: Negative for dysuria.  Musculoskeletal: Negative for falls.  Skin: Negative for rash.  Neurological: Negative for loss of consciousness and headaches.  Endo/Heme/Allergies: Negative for environmental allergies.  Psychiatric/Behavioral: Negative for depression. The patient is not nervous/anxious.        Objective:    Physical Exam  Constitutional: She is oriented to person, place, and time. She appears well-developed and well-nourished. No distress.  HENT:  Head: Normocephalic and atraumatic.  Nose: Nose normal.  Eyes: Right eye exhibits no discharge. Left eye exhibits no discharge.  Neck: Normal range of motion. Neck supple.  Cardiovascular: Normal  rate and regular rhythm.   No murmur heard. Pulmonary/Chest: Effort normal and breath sounds normal.  Abdominal: Soft. Bowel sounds are normal. There is no tenderness.  Musculoskeletal: She exhibits no edema.  Neurological: She is alert and oriented to person, place, and time.  Skin: Skin is warm and dry.  Psychiatric: She has a normal mood and affect.  Nursing note and vitals reviewed.   BP 120/72 mmHg  Pulse 58  Temp(Src) 98.3 F (36.8 C) (Oral)  Ht 4\' 11"  (1.499 m)  Wt 138 lb 6 oz (62.766 kg)  BMI 27.93 kg/m2  SpO2 94% Wt Readings from Last 3 Encounters:  07/27/15 138 lb 6 oz (62.766 kg)  06/25/15 138 lb 6.4 oz (62.778 kg)  03/27/15 139 lb 6.4 oz (63.231 kg)     Lab Results  Component Value Date   WBC 9.3 07/23/2015   HGB 14.3 07/23/2015   HCT 43.3 07/23/2015   PLT 215.0 07/23/2015   GLUCOSE 109* 07/23/2015   CHOL 179 07/23/2015   TRIG 192.0* 07/23/2015   HDL 54.00 07/23/2015   LDLDIRECT 146.8 07/13/2012   LDLCALC 87 07/23/2015   ALT 26 07/23/2015   AST 35 07/23/2015   NA 137 07/23/2015   K 4.3 07/23/2015   CL 101 07/23/2015   CREATININE 0.73 07/23/2015   BUN 10 07/23/2015   CO2 30 07/23/2015   TSH 2.81 07/23/2015   HGBA1C 6.6* 07/23/2015    Lab Results  Component Value Date   TSH 2.81 07/23/2015   Lab Results  Component Value Date   WBC 9.3 07/23/2015   HGB 14.3 07/23/2015   HCT 43.3 07/23/2015   MCV 92.5 07/23/2015   PLT 215.0 07/23/2015   Lab Results  Component Value Date   NA 137 07/23/2015   K 4.3 07/23/2015   CO2 30 07/23/2015   GLUCOSE 109* 07/23/2015   BUN 10 07/23/2015   CREATININE 0.73 07/23/2015   BILITOT 0.4 07/23/2015   ALKPHOS 88 07/23/2015   AST 35 07/23/2015   ALT 26 07/23/2015   PROT 7.1 07/23/2015   ALBUMIN 3.9 07/23/2015   CALCIUM 9.5 07/23/2015   GFR 82.07 07/23/2015   Lab Results  Component Value Date   CHOL 179 07/23/2015   Lab Results  Component Value Date   HDL 54.00 07/23/2015   Lab Results  Component  Value Date   LDLCALC 87 07/23/2015   Lab Results  Component Value Date   TRIG 192.0* 07/23/2015   Lab Results  Component Value Date   CHOLHDL 3 07/23/2015   Lab Results  Component Value Date   HGBA1C 6.6* 07/23/2015  Assessment & Plan:   Problem List Items Addressed This Visit    Abnormal liver function tests   Relevant Orders   CBC   TSH   Comprehensive metabolic panel   Lipid panel   Hemoglobin A1c   Microalbumin / creatinine urine ratio   DM type 2, goal A1c below 7    A1C is now 6.6. hgba1c acceptable, minimize simple carbs. Increase exercise as tolerated.      History of shingles    Encouraged to call her insurance provider and confirm that they cover Zostavax and then let us know if she wants a prescription sent      Hyperlipidemia    Tolerating statin, encouraged heart healthy diet, avoid trans fats, minimize simple carbs and saturated fats. Increase exercise as tolerated      Relevant Orders   CBC   TSH   Comprehensive metabolic panel   Lipid panel   Hemoglobin A1c   Microalbumin / creatinine urine ratio   Hypertension - Primary    Well controlled, no changes to meds. Encouraged heart healthy diet such as the DASH diet and exercise as tolerated.       Relevant Orders   CBC   TSH   Comprehensive metabolic panel   Lipid panel   Hemoglobin A1c   Microalbumin / creatinine urine ratio   Vitamin D deficiency    WNL but lower end, needs to take at least 2000 IU daily      Relevant Orders   CBC   TSH   VITAMIN D 25 Hydroxy (Vit-D Deficiency, Fractures)   Comprehensive metabolic panel   Lipid panel   Hemoglobin A1c   Microalbumin / creatinine urine ratio      I am having Ms. Gearing maintain her fish oil-omega-3 fatty acids, propranolol, simvastatin, and Vitamin D3.  No orders of the defined types were placed in this encounter.     Willette Alma, MD

## 2015-07-27 NOTE — Patient Instructions (Signed)
Call insurance and make sure they pay for Zostavax/shingles shot   Basic Carbohydrate Counting for Diabetes Mellitus Carbohydrate counting is a method for keeping track of the amount of carbohydrates you eat. Eating carbohydrates naturally increases the level of sugar (glucose) in your blood, so it is important for you to know the amount that is okay for you to have in every meal. Carbohydrate counting helps keep the level of glucose in your blood within normal limits. The amount of carbohydrates allowed is different for every person. A dietitian can help you calculate the amount that is right for you. Once you know the amount of carbohydrates you can have, you can count the carbohydrates in the foods you want to eat. Carbohydrates are found in the following foods:  Grains, such as breads and cereals.  Dried beans and soy products.  Starchy vegetables, such as potatoes, peas, and corn.  Fruit and fruit juices.  Milk and yogurt.  Sweets and snack foods, such as cake, cookies, candy, chips, soft drinks, and fruit drinks. CARBOHYDRATE COUNTING There are two ways to count the carbohydrates in your food. You can use either of the methods or a combination of both. Reading the "Nutrition Facts" on [place] The "Nutrition Facts" is an area that is included on the labels of almost all packaged food and beverages in the [place]. It includes the serving size of that food or beverage and information about the nutrients in each serving of the food, including the grams (g) of carbohydrate per serving.  Decide the number of servings of this food or beverage that you will be able to eat or drink. Multiply that number of servings by the number of grams of carbohydrate that is listed on the label for that serving. The total will be the amount of carbohydrates you will be having when you eat or drink this food or beverage. Learning Standard Serving Sizes of Food When you eat food that is not packaged  or does not include "Nutrition Facts" on the label, you need to measure the servings in order to count the amount of carbohydrates.A serving of most carbohydrate-rich foods contains about 15 g of carbohydrates. The following list includes serving sizes of carbohydrate-rich foods that provide 15 g ofcarbohydrate per serving:   1 slice of bread (1 oz) or 1 six-inch tortilla.    of a hamburger bun or English muffin.  4-6 crackers.   cup unsweetened dry cereal.    cup hot cereal.   cup rice or pasta.    cup mashed potatoes or  of a large baked potato.  1 cup fresh fruit or one small piece of fruit.    cup canned or frozen fruit or fruit juice.  1 cup milk.   cup plain fat-free yogurt or yogurt sweetened with artificial sweeteners.   cup cooked dried beans or starchy vegetable, such as peas, corn, or potatoes.  Decide the number of standard-size servings that you will eat. Multiply that number of servings by 15 (the grams of carbohydrates in that serving). For example, if you eat 2 cups of strawberries, you will have eaten 2 servings and 30 g of carbohydrates (2 servings x 15 g = 30 g). For foods such as soups and casseroles, in which more than one food is mixed in, you will need to count the carbohydrates in each food that is included. EXAMPLE OF CARBOHYDRATE COUNTING Sample Dinner  3 oz chicken breast.   cup of brown rice.   cup  of corn.  1 cup milk.   1 cup strawberries with sugar-free whipped topping.  Carbohydrate Calculation Step 1: Identify the foods that contain carbohydrates:   Rice.   Corn.   Milk.   Strawberries. Step 2:Calculate the number of servings eaten of each:   2 servings of rice.   1 serving of corn.   1 serving of milk.   1 serving of strawberries. Step 3: Multiply each of those number of servings by 15 g:   2 servings of rice x 15 g = 30 g.   1 serving of corn x 15 g = 15 g.   1 serving of milk x 15 g = 15  g.   1 serving of strawberries x 15 g = 15 g. Step 4: Add together all of the amounts to find the total grams of carbohydrates eaten: 30 g + 15 g + 15 g + 15 g = 75 g.   This information is not intended to replace advice given to you by your health care provider. Make sure you discuss any questions you have with your health care provider.   Document Released: 06/20/2005 Document Revised: 07/11/2014 Document Reviewed: 05/17/2013 Elsevier Interactive Patient Education [employer].

## 2015-08-09 ENCOUNTER — Encounter: Payer: Self-pay | Admitting: Family Medicine

## 2015-08-09 NOTE — Assessment & Plan Note (Signed)
Tolerating statin, encouraged heart healthy diet, avoid trans fats, minimize simple carbs and saturated fats. Increase exercise as tolerated 

## 2015-08-09 NOTE — Assessment & Plan Note (Signed)
Well controlled, no changes to meds. Encouraged heart healthy diet such as the DASH diet and exercise as tolerated.  °

## 2015-08-09 NOTE — Assessment & Plan Note (Signed)
WNL but lower end, needs to take at least 2000 IU daily

## 2015-08-09 NOTE — Assessment & Plan Note (Signed)
Encouraged to call her insurance provider and confirm that they cover Zostavax and then let us know if she wants a prescription sent

## 2015-08-09 NOTE — Assessment & Plan Note (Signed)
A1C is now 6.6. hgba1c acceptable, minimize simple carbs. Increase exercise as tolerated.

## 2015-11-23 ENCOUNTER — Other Ambulatory Visit (INDEPENDENT_AMBULATORY_CARE_PROVIDER_SITE_OTHER): Payer: Commercial Managed Care - HMO

## 2015-11-23 DIAGNOSIS — I10 Essential (primary) hypertension: Secondary | ICD-10-CM

## 2015-11-23 DIAGNOSIS — E559 Vitamin D deficiency, unspecified: Secondary | ICD-10-CM | POA: Diagnosis not present

## 2015-11-23 DIAGNOSIS — E785 Hyperlipidemia, unspecified: Secondary | ICD-10-CM | POA: Diagnosis not present

## 2015-11-23 DIAGNOSIS — R739 Hyperglycemia, unspecified: Secondary | ICD-10-CM

## 2015-11-23 DIAGNOSIS — R945 Abnormal results of liver function studies: Secondary | ICD-10-CM

## 2015-11-23 DIAGNOSIS — R7989 Other specified abnormal findings of blood chemistry: Secondary | ICD-10-CM

## 2015-11-23 LAB — LIPID PANEL
Cholesterol: 144 mg/dL (ref 0–200)
HDL: 46.1 mg/dL (ref >39.00)
LDL Cholesterol: 78 mg/dL (ref 0–99)
NonHDL: 98.12
Total CHOL/HDL Ratio: 3
Triglycerides: 103 mg/dL (ref 0.0–149.0)
VLDL: 20.6 mg/dL (ref 0.0–40.0)

## 2015-11-23 LAB — COMPREHENSIVE METABOLIC PANEL
ALT: 25 U/L (ref 0–35)
AST: 38 U/L — ABNORMAL HIGH (ref 0–37)
Albumin: 4.1 g/dL (ref 3.5–5.2)
Alkaline Phosphatase: 72 U/L (ref 39–117)
BUN: 10 mg/dL (ref 6–23)
CO2: 27 mEq/L (ref 19–32)
Calcium: 9.4 mg/dL (ref 8.4–10.5)
Chloride: 101 mEq/L (ref 96–112)
Creatinine, Ser: 0.71 mg/dL (ref 0.40–1.20)
GFR: 84.67 mL/min (ref >60.00)
Glucose, Bld: 105 mg/dL — ABNORMAL HIGH (ref 70–99)
Potassium: 3.7 mEq/L (ref 3.5–5.1)
Sodium: 136 mEq/L (ref 135–145)
Total Bilirubin: 0.8 mg/dL (ref 0.2–1.2)
Total Protein: 6.8 g/dL (ref 6.0–8.3)

## 2015-11-23 LAB — CBC
HCT: 39.3 % (ref 36.0–46.0)
Hemoglobin: 13 g/dL (ref 12.0–15.0)
MCHC: 33.1 g/dL (ref 30.0–36.0)
MCV: 92.4 fl (ref 78.0–100.0)
Platelets: 187 10*3/uL (ref 150.0–400.0)
RBC: 4.26 Mil/uL (ref 3.87–5.11)
RDW: 14 % (ref 11.5–15.5)
WBC: 8.5 10*3/uL (ref 4.0–10.5)

## 2015-11-23 LAB — MICROALBUMIN / CREATININE URINE RATIO
Creatinine,U: 24.5 mg/dL
Microalb Creat Ratio: 2.9 mg/g (ref 0.0–30.0)
Microalb, Ur: <0.7 mg/dL (ref 0.0–1.9)

## 2015-11-23 LAB — TSH: TSH: 1.77 u[IU]/mL (ref 0.35–4.50)

## 2015-11-23 LAB — HEMOGLOBIN A1C: Hgb A1c MFr Bld: 6.7 % — ABNORMAL HIGH (ref 4.6–6.5)

## 2015-11-23 LAB — VITAMIN D 25 HYDROXY (VIT D DEFICIENCY, FRACTURES): VITD: 36.71 ng/mL (ref 30.00–100.00)

## 2015-11-26 ENCOUNTER — Encounter: Payer: Self-pay | Admitting: Family Medicine

## 2015-11-26 ENCOUNTER — Ambulatory Visit (INDEPENDENT_AMBULATORY_CARE_PROVIDER_SITE_OTHER): Payer: Commercial Managed Care - HMO | Admitting: Family Medicine

## 2015-11-26 VITALS — BP 118/82 | HR 58 | Temp 98.3°F | Ht 59.5 in | Wt 136.1 lb

## 2015-11-26 DIAGNOSIS — R945 Abnormal results of liver function studies: Secondary | ICD-10-CM

## 2015-11-26 DIAGNOSIS — E1310 Other specified diabetes mellitus with ketoacidosis without coma: Secondary | ICD-10-CM

## 2015-11-26 DIAGNOSIS — I10 Essential (primary) hypertension: Secondary | ICD-10-CM

## 2015-11-26 DIAGNOSIS — H547 Unspecified visual loss: Secondary | ICD-10-CM | POA: Diagnosis not present

## 2015-11-26 DIAGNOSIS — E119 Type 2 diabetes mellitus without complications: Secondary | ICD-10-CM

## 2015-11-26 DIAGNOSIS — R7989 Other specified abnormal findings of blood chemistry: Secondary | ICD-10-CM

## 2015-11-26 DIAGNOSIS — E785 Hyperlipidemia, unspecified: Secondary | ICD-10-CM

## 2015-11-26 DIAGNOSIS — E559 Vitamin D deficiency, unspecified: Secondary | ICD-10-CM

## 2015-11-26 DIAGNOSIS — E1110 Type 2 diabetes mellitus with ketoacidosis without coma: Secondary | ICD-10-CM

## 2015-11-26 NOTE — Progress Notes (Signed)
Pre visit review using our clinic review tool, if applicable. No additional management support is needed unless otherwise documented below in the visit note. 

## 2015-11-26 NOTE — Patient Instructions (Signed)

## 2015-12-06 NOTE — Assessment & Plan Note (Signed)
Well controlled, no changes to meds. Encouraged heart healthy diet such as the DASH diet and exercise as tolerated.  °

## 2015-12-06 NOTE — Assessment & Plan Note (Signed)
Tolerating statin, encouraged heart healthy diet, avoid trans fats, minimize simple carbs and saturated fats. Increase exercise as tolerated 

## 2015-12-06 NOTE — Assessment & Plan Note (Addendum)
hgba1c acceptable, minimize simple carbs. Increase exercise as tolerated. Continue current meds. Referred for eye exam at this time.

## 2015-12-06 NOTE — Assessment & Plan Note (Signed)
Continue current supplements, kevel wnl

## 2015-12-06 NOTE — Progress Notes (Signed)
Patient ID: Natalie Swanson, female   DOB: 1935/07/31, 80 y.o.   MRN: [id number]   Subjective:    Patient ID: Natalie Swanson, female    DOB: [birth year], 80 y.o.   MRN: [id number]  Chief Complaint  Patient presents with  . Follow-up    HPI Patient is in today for follow up. She feels well. No recent illness or acute concerns. She denies any polyuria or polydipsia. Is in need of an eye exam. Denies CP/palp/SOB/HA/congestion/fevers/GI or GU c/o. Taking meds as prescribed  Past Medical History  Diagnosis Date  . Shingles 9-12  . Chicken pox as a child  . Flashers or floaters, right eye 9-12    floaters  . Mumps as a child  . Measles as a child  . Cataract 07-04-2006    b/l  . Hypertension   . Hyperlipidemia   . Atopic dermatitis 01/11/2012  . Hyperglycemia 07/18/2012  . History of tobacco abuse 01/11/2012  . Medicare annual wellness visit, subsequent 04/17/2011    G4P1 s/p 1 svd, no abnormal papas, menarche at 35 menopause at 56, regular   . Pain in joint, ankle and foot 04/27/2014    right  . Vitamin D deficiency 09/14/2014  . Hx of adenomatous colonic polyps 11/07/2014  . Abnormal liver function tests 04/03/2015  . History of shingles   . DM type 2, goal A1c below 7 07/18/2012    Past Surgical History  Procedure Laterality Date  . Cataract extraction w/ intraocular lens  implant, bilateral Bilateral 07-04-2006  . Cesarean section  1981    X 1    Family History  Problem Relation Age of Onset  . Hyperlipidemia Mother   . Hypertension Mother   . Heart disease Mother     MI  . Heart attack Father   . Diabetes Father     type 2  . Heart attack Brother   . Heart disease Brother 21    MI  . Hemophilia Son     x 2  . Hypertension Son   . Colon cancer Neg Hx   . Stomach cancer Neg Hx   . Cancer Father     type unknown    Social History   Social History  . Marital Status: Married    Spouse Name: N/A  . Number of Children: 4  . Years of Education: N/A    Occupational History  . retired    Social History Main Topics  . Smoking status: Former Smoker -- 0.20 packs/day for 61 years    Types: Cigarettes  . Smokeless tobacco: Never Used     Comment: uses e cigs ; quit smoking cigarettes 2013.  Marland Kitchen Alcohol Use: No  . Drug Use: No  . Sexual Activity: No     Comment: no dietary restrictions. lives with self.    Other Topics Concern  . Not on file   Social History Narrative    Outpatient Prescriptions Prior to Visit  Medication Sig Dispense Refill  . Cholecalciferol (VITAMIN D3) 5000 UNITS CAPS Take by mouth.    . fish oil-omega-3 fatty acids 1000 MG capsule Take 2 g by mouth daily.      . propranolol (INDERAL) 40 MG tablet Take 1 tablet (40 mg total) by mouth 2 (two) times daily. 180 tablet 2  . simvastatin (ZOCOR) 40 MG tablet Take 1 tablet (40 mg total) by mouth at bedtime. 90 tablet 2   No facility-administered medications prior to visit.    Allergies  Allergen Reactions  . Sulfa Antibiotics Rash    Review of Systems  Constitutional: Negative for fever and malaise/fatigue.  HENT: Negative for congestion.   Eyes: Negative for blurred vision.  Respiratory: Negative for shortness of breath.   Cardiovascular: Negative for chest pain, palpitations and leg swelling.  Gastrointestinal: Negative for nausea, abdominal pain and blood in stool.  Genitourinary: Negative for dysuria and frequency.  Musculoskeletal: Negative for falls.  Skin: Negative for rash.  Neurological: Negative for dizziness, loss of consciousness and headaches.  Endo/Heme/Allergies: Negative for environmental allergies.  Psychiatric/Behavioral: Negative for depression. The patient is not nervous/anxious.        Objective:    Physical Exam  Constitutional: She is oriented to person, place, and time. She appears well-developed and well-nourished. No distress.  HENT:  Head: Normocephalic and atraumatic.  Nose: Nose normal.  Eyes: Right eye exhibits no  discharge. Left eye exhibits no discharge.  Neck: Normal range of motion. Neck supple.  Cardiovascular: Normal rate and regular rhythm.   No murmur heard. Pulmonary/Chest: Effort normal and breath sounds normal.  Abdominal: Soft. Bowel sounds are normal. There is no tenderness.  Musculoskeletal: She exhibits no edema.  Neurological: She is alert and oriented to person, place, and time.  Skin: Skin is warm and dry.  Psychiatric: She has a normal mood and affect.  Nursing note and vitals reviewed.   BP 118/82 mmHg  Pulse 58  Temp(Src) 98.3 F (36.8 C) (Oral)  Ht 4' 11.5" (1.511 m)  Wt 136 lb 2 oz (61.746 kg)  BMI 27.04 kg/m2  SpO2 94% Wt Readings from Last 3 Encounters:  11/26/15 136 lb 2 oz (61.746 kg)  07/27/15 138 lb 6 oz (62.766 kg)  06/25/15 138 lb 6.4 oz (62.778 kg)     Lab Results  Component Value Date   WBC 8.5 11/23/2015   HGB 13.0 11/23/2015   HCT 39.3 11/23/2015   PLT 187.0 11/23/2015   GLUCOSE 105* 11/23/2015   CHOL 144 11/23/2015   TRIG 103.0 11/23/2015   HDL 46.10 11/23/2015   LDLDIRECT 146.8 07/13/2012   LDLCALC 78 11/23/2015   ALT 25 11/23/2015   AST 38* 11/23/2015   NA 136 11/23/2015   K 3.7 11/23/2015   CL 101 11/23/2015   CREATININE 0.71 11/23/2015   BUN 10 11/23/2015   CO2 27 11/23/2015   TSH 1.77 11/23/2015   HGBA1C 6.7* 11/23/2015   MICROALBUR <0.7 11/23/2015    Lab Results  Component Value Date   TSH 1.77 11/23/2015   Lab Results  Component Value Date   WBC 8.5 11/23/2015   HGB 13.0 11/23/2015   HCT 39.3 11/23/2015   MCV 92.4 11/23/2015   PLT 187.0 11/23/2015   Lab Results  Component Value Date   NA 136 11/23/2015   K 3.7 11/23/2015   CO2 27 11/23/2015   GLUCOSE 105* 11/23/2015   BUN 10 11/23/2015   CREATININE 0.71 11/23/2015   BILITOT 0.8 11/23/2015   ALKPHOS 72 11/23/2015   AST 38* 11/23/2015   ALT 25 11/23/2015   PROT 6.8 11/23/2015   ALBUMIN 4.1 11/23/2015   CALCIUM 9.4 11/23/2015   GFR 84.67 11/23/2015   Lab  Results  Component Value Date   CHOL 144 11/23/2015   Lab Results  Component Value Date   HDL 46.10 11/23/2015   Lab Results  Component Value Date   LDLCALC 78 11/23/2015   Lab Results  Component Value Date   TRIG 103.0 11/23/2015   Lab Results  Component Value Date   CHOLHDL 3 11/23/2015   Lab Results  Component Value Date   HGBA1C 6.7* 11/23/2015       Assessment & Plan:   Problem List Items Addressed This Visit    Vitamin D deficiency    Continue current supplements, kevel wnl      Relevant Orders   TSH   CBC   Hemoglobin A1c   Comprehensive metabolic panel   Lipid panel   Microalbumin / creatinine urine ratio   Vitamin D (25 hydroxy)   Hypertension    Well controlled, no changes to meds. Encouraged heart healthy diet such as the DASH diet and exercise as tolerated.       Relevant Orders   TSH   CBC   Hemoglobin A1c   Comprehensive metabolic panel   Lipid panel   Microalbumin / creatinine urine ratio   Vitamin D (25 hydroxy)   Hyperlipidemia    Tolerating statin, encouraged heart healthy diet, avoid trans fats, minimize simple carbs and saturated fats. Increase exercise as tolerated      Relevant Orders   TSH   CBC   Hemoglobin A1c   Comprehensive metabolic panel   Lipid panel   Microalbumin / creatinine urine ratio   Vitamin D (25 hydroxy)   DM type 2, goal A1c below 7    hgba1c acceptable, minimize simple carbs. Increase exercise as tolerated. Continue current meds. Referred for eye exam at this time.      Relevant Orders   TSH   CBC   Hemoglobin A1c   Comprehensive metabolic panel   Lipid panel   Microalbumin / creatinine urine ratio   Vitamin D (25 hydroxy)   Abnormal liver function tests   Relevant Orders   TSH   CBC   Hemoglobin A1c   Comprehensive metabolic panel   Lipid panel   Microalbumin / creatinine urine ratio   Vitamin D (25 hydroxy)    Other Visit Diagnoses    Uncontrolled type 2 diabetes mellitus with  ketoacidosis without coma, without long-term current use of insulin ([place])    -  Primary    Relevant Orders    Ambulatory referral to Ophthalmology    TSH    CBC    Hemoglobin A1c    Comprehensive metabolic panel    Lipid panel    Microalbumin / creatinine urine ratio    Vitamin D (25 hydroxy)    Decreased visual acuity        Relevant Orders    Ambulatory referral to Ophthalmology    TSH    CBC    Hemoglobin A1c    Comprehensive metabolic panel    Lipid panel    Microalbumin / creatinine urine ratio    Vitamin D (25 hydroxy)       I am having Ms. Flahive maintain her fish oil-omega-3 fatty acids, propranolol, simvastatin, and Vitamin D3.  No orders of the defined types were placed in this encounter.     Penni Homans, MD

## 2015-12-16 ENCOUNTER — Other Ambulatory Visit: Payer: Self-pay | Admitting: Family Medicine

## 2015-12-16 NOTE — Telephone Encounter (Signed)
Rx's sent to the pharmacy by e-script.//AB/CMA 

## 2016-02-09 DIAGNOSIS — H35373 Puckering of macula, bilateral: Secondary | ICD-10-CM | POA: Diagnosis not present

## 2016-02-09 DIAGNOSIS — E119 Type 2 diabetes mellitus without complications: Secondary | ICD-10-CM | POA: Diagnosis not present

## 2016-02-09 DIAGNOSIS — H524 Presbyopia: Secondary | ICD-10-CM | POA: Diagnosis not present

## 2016-02-09 DIAGNOSIS — H26493 Other secondary cataract, bilateral: Secondary | ICD-10-CM | POA: Diagnosis not present

## 2016-02-09 DIAGNOSIS — H52223 Regular astigmatism, bilateral: Secondary | ICD-10-CM | POA: Diagnosis not present

## 2016-02-09 DIAGNOSIS — H43812 Vitreous degeneration, left eye: Secondary | ICD-10-CM | POA: Diagnosis not present

## 2016-02-09 LAB — HM DIABETES EYE EXAM

## 2016-02-12 ENCOUNTER — Encounter: Payer: Self-pay | Admitting: Family Medicine

## 2016-06-24 ENCOUNTER — Ambulatory Visit: Payer: Self-pay

## 2016-06-28 ENCOUNTER — Ambulatory Visit: Payer: Commercial Managed Care - HMO | Admitting: *Deleted

## 2016-06-30 ENCOUNTER — Encounter: Payer: Commercial Managed Care - HMO | Admitting: Family Medicine

## 2016-07-05 ENCOUNTER — Encounter: Payer: Commercial Managed Care - HMO | Admitting: Family Medicine

## 2016-07-19 NOTE — Progress Notes (Deleted)
Pre visit review using our clinic review tool, if applicable. No additional management support is needed unless otherwise documented below in the visit note. 

## 2016-07-19 NOTE — Progress Notes (Deleted)
Subjective:   Natalie Swanson is a 81 y.o. female who presents for Medicare Annual (Subsequent) preventive examination.  Review of Systems:  No ROS.  Medicare Wellness Visit.   Sleep patterns: {SX; SLEEP PATTERNS:18802::"feels rested on waking","does not get up to void","gets up *** times nightly to void","*** hours nightly"}.   Home Safety/Smoke Alarms:   Living environment; residence and Firearm Safety: {Rehab home environment / accessibility:30080::"no firearms","firearms stored safely"}. Seat Belt Safety/Bike Helmet: Wears seat belt.   Counseling:   Eye Exam-  Dental-  Female:   Pap-       Mammo-       Dexa scan- Last 08/24/14: Osteopenia.       CCS- Two polyps found and removed. Otherwise normal. Unlikely to need another routine colonoscopy per report.     Objective:     Vitals: There were no vitals taken for this visit.  There is no height or weight on file to calculate BMI.   Tobacco History  Smoking Status  . Former Smoker  . Packs/day: 0.20  . Years: 61.00  . Types: Cigarettes  Smokeless Tobacco  . Never Used    Comment: uses e cigs ; quit smoking cigarettes 2013.     Counseling given: Not Answered   Past Medical History:  Diagnosis Date  . Abnormal liver function tests 04/03/2015  . Atopic dermatitis 01/11/2012  . Cataract 07-04-2006   b/l  . Chicken pox as a child  . DM type 2, goal A1c below 7 07/18/2012  . Flashers or floaters, right eye 9-12   floaters  . History of shingles   . History of tobacco abuse 01/11/2012  . Hx of adenomatous colonic polyps 11/07/2014  . Hyperglycemia 07/18/2012  . Hyperlipidemia   . Hypertension   . Measles as a child  . Medicare annual wellness visit, subsequent 04/17/2011   G4P1 s/p 1 svd, no abnormal papas, menarche at 39 menopause at 68, regular   . Mumps as a child  . Pain in joint, ankle and foot 04/27/2014   right  . Shingles 9-12  . Vitamin D deficiency 09/14/2014   Past Surgical History:  Procedure  Laterality Date  . CATARACT EXTRACTION W/ INTRAOCULAR LENS  IMPLANT, BILATERAL Bilateral 07-04-2006  . CESAREAN SECTION  1981   X 1   Family History  Problem Relation Age of Onset  . Hyperlipidemia Mother   . Hypertension Mother   . Heart disease Mother     MI  . Heart attack Father   . Diabetes Father     type 2  . Heart attack Brother   . Heart disease Brother 46    MI  . Hemophilia Son     x 2  . Hypertension Son   . Colon cancer Neg Hx   . Stomach cancer Neg Hx   . Cancer Father     type unknown   History  Sexual Activity  . Sexual activity: No    Comment: no dietary restrictions. lives with self.     Outpatient Encounter Prescriptions as of 07/21/2016  Medication Sig  . Cholecalciferol (VITAMIN D3) 5000 UNITS CAPS Take by mouth.  . fish oil-omega-3 fatty acids 1000 MG capsule Take 2 g by mouth daily.    . propranolol (INDERAL) 40 MG tablet TAKE 1 TABLET TWICE DAILY  . simvastatin (ZOCOR) 40 MG tablet TAKE 1 TABLET AT BEDTIME   No facility-administered encounter medications on file as of 07/21/2016.     Activities of Daily  Living No flowsheet data found.  Patient Care Team: Mosie Lukes, MD as PCP - General (Family Medicine) Gatha Mayer, MD as Consulting Physician (Gastroenterology)    Assessment:    Physical assessment deferred to PCP.  Exercise Activities and Dietary recommendations   Diet (meal preparation, eat out, water intake, caffeinated beverages, dairy products, fruits and vegetables): {Desc; diets:16563} Breakfast: Lunch:  Dinner:      Goals      Patient Stated   . Remain active and independent. (pt-stated)          Healthy diet, exercise, and regular check up with PCP.        Fall Risk Fall Risk  06/25/2015 04/18/2014  Falls in the past year? No No   Depression Screen PHQ 2/9 Scores 06/25/2015 04/18/2014  PHQ - 2 Score 0 0     Cognitive Function MMSE - Mini Mental State Exam 06/25/2015  Orientation to time 5  Orientation  to Place 5  Registration 3  Attention/ Calculation 5  Recall 3  Language- name 2 objects 2  Language- repeat 1  Language- follow 3 step command 3  Language- read & follow direction 1  Write a sentence 1  Copy design 1  Total score 30        Immunization History  Administered Date(s) Administered  . Influenza Split 04/06/2011, 07/18/2012  . Influenza,inj,Quad PF,36+ Mos 03/13/2013, 04/18/2014, 03/27/2015  . Pneumococcal Conjugate-13 07/26/2013  . Pneumococcal Polysaccharide-23 06/25/2015  . Tdap 04/06/2011   Screening Tests Health Maintenance  Topic Date Due  . FOOT EXAM  02/13/1948  . ZOSTAVAX  02/12/1998  . INFLUENZA VACCINE  02/02/2016  . HEMOGLOBIN A1C  05/25/2016  . URINE MICROALBUMIN  11/22/2016  . OPHTHALMOLOGY EXAM  02/08/2017  . TETANUS/TDAP  04/05/2021  . DEXA SCAN  Completed  . PNA vac Low Risk Adult  Completed      Plan:   *** During the course of the visit the patient was educated and counseled about the following appropriate screening and preventive services:   Vaccines to include Pneumoccal, Influenza, Hepatitis B, Td, Zostavax, HCV  Electrocardiogram  Cardiovascular Disease  Colorectal cancer screening  Bone density screening  Diabetes screening  Glaucoma screening  Mammography/PAP  Nutrition counseling   Patient Instructions (the written plan) was given to the patient.   Shela Nevin, [place]  07/19/2016

## 2016-07-20 ENCOUNTER — Other Ambulatory Visit: Payer: Self-pay | Admitting: Family Medicine

## 2016-07-21 ENCOUNTER — Ambulatory Visit: Payer: Commercial Managed Care - HMO | Admitting: *Deleted

## 2016-07-27 NOTE — Progress Notes (Signed)
Subjective:   Natalie Swanson is a 81 y.o. female who presents for Medicare Annual (Subsequent) preventive examination.  Review of Systems:  No ROS.  Medicare Wellness Visit.  Cardiac Risk Factors include: advanced age (>73men, >76 women);diabetes mellitus;dyslipidemia;hypertension Sleep patterns: Sleeps about 7-8 hrs per night. Feels rested.  Home Safety/Smoke Alarms: Feels safe in home. Smoke alarms in place.    Living environment; residence and Firearm Safety: Lives in double wide with granddaughter. Guns safely stored.    Seat Belt Safety/Bike Helmet: Wears seat belt.   Counseling:   Eye Exam- Pt states she had diabetic eye exam this summer with Dr.Digby.  Dental- No dentist. Upper and lower partials. Gum care discussed. Dental resource list provided.   Female:   Pap- Pt no longer getting screened.       Mammo- Order placed today.      Dexa scan- Last 08/24/14: Osteopenia. Order placed today.      CCS- Last 11/07/14: 2 polyps found and removed. Otherwise normal. Unlikely to need another routine screening per report.    Objective:     Vitals: BP 138/82 (BP Location: Left Arm, Patient Position: Sitting, Cuff Size: Normal)   Pulse 63   Ht 5' (1.524 m)   Wt 133 lb 12.8 oz (60.7 kg)   SpO2 98%   BMI 26.13 kg/m   Body mass index is 26.13 kg/m.   Tobacco History  Smoking Status  . Former Smoker  . Packs/day: 0.20  . Years: 61.00  . Types: Cigarettes  Smokeless Tobacco  . Never Used    Comment: uses e cigs ; quit smoking cigarettes 2013.     Counseling given: No   Past Medical History:  Diagnosis Date  . Abnormal liver function tests 04/03/2015  . Atopic dermatitis 01/11/2012  . Cataract 07-04-2006   b/l  . Chicken pox as a child  . DM type 2, goal A1c below 7 07/18/2012  . Flashers or floaters, right eye 9-12   floaters  . History of shingles   . History of tobacco abuse 01/11/2012  . Hx of adenomatous colonic polyps 11/07/2014  . Hyperglycemia 07/18/2012  .  Hyperlipidemia   . Hypertension   . Measles as a child  . Medicare annual wellness visit, subsequent 04/17/2011   G4P1 s/p 1 svd, no abnormal papas, menarche at 86 menopause at 32, regular   . Mumps as a child  . Pain in joint, ankle and foot 04/27/2014   right  . Shingles 9-12  . Vitamin D deficiency 09/14/2014   Past Surgical History:  Procedure Laterality Date  . CATARACT EXTRACTION W/ INTRAOCULAR LENS  IMPLANT, BILATERAL Bilateral 07-04-2006  . CESAREAN SECTION  1981   X 1   Family History  Problem Relation Age of Onset  . Hyperlipidemia Mother   . Hypertension Mother   . Heart disease Mother     MI  . Heart attack Father   . Diabetes Father     type 2  . Cancer Father     type unknown  . Heart attack Brother   . Heart disease Brother 17    MI  . Hemophilia Son     x 2  . Hypertension Son   . Colon cancer Neg Hx   . Stomach cancer Neg Hx    History  Sexual Activity  . Sexual activity: No    Comment: no dietary restrictions. lives with self.     Outpatient Encounter Prescriptions as of 07/28/2016  Medication Sig  . fish oil-omega-3 fatty acids 1000 MG capsule Take 2 g by mouth daily.    . propranolol (INDERAL) 40 MG tablet TAKE 1 TABLET TWICE DAILY  . simvastatin (ZOCOR) 40 MG tablet TAKE 1 TABLET AT BEDTIME  . Cholecalciferol (VITAMIN D3) 5000 UNITS CAPS Take by mouth.   No facility-administered encounter medications on file as of 07/28/2016.     Activities of Daily Living In your present state of health, do you have any difficulty performing the following activities: 07/28/2016  Hearing? N  Vision? (No Data)  Difficulty concentrating or making decisions? N  Walking or climbing stairs? N  Dressing or bathing? N  Doing errands, shopping? N  Preparing Food and eating ? N  Using the Toilet? N  In the past six months, have you accidently leaked urine? Y  Do you have problems with loss of bowel control? N  Managing your Medications? N  Managing your  Finances? N  Housekeeping or managing your Housekeeping? N  Some recent data might be hidden    Patient Care Team: Mosie Lukes, MD as PCP - General (Family Medicine) Gatha Mayer, MD as Consulting Physician (Gastroenterology)    Assessment:    Physical assessment deferred to PCP. Exercise Activities and Dietary recommendations Current Exercise Habits: Home exercise routine, Type of exercise: strength training/weights;stretching, Time (Minutes): 50, Frequency (Times/Week): 5, Weekly Exercise (Minutes/Week): 250, Intensity: Mild   Diet (meal preparation, eat out, water intake, caffeinated beverages, dairy products, fruits and vegetables): in general, a "healthy" diet  , on average, 3 meals per day  Pt states she drinks a lot of water.   Goals      Patient Stated   . Remain active and independent. (pt-stated)          Healthy diet, exercise, and regular check up with PCP.        Fall Risk Fall Risk  07/28/2016 06/25/2015 04/18/2014  Falls in the past year? No No No   Depression Screen PHQ 2/9 Scores 07/28/2016 06/25/2015 04/18/2014  PHQ - 2 Score 0 0 0     Cognitive Function MMSE - Mini Mental State Exam 06/25/2015  Orientation to time 5  Orientation to Place 5  Registration 3  Attention/ Calculation 5  Recall 3  Language- name 2 objects 2  Language- repeat 1  Language- follow 3 step command 3  Language- read & follow direction 1  Write a sentence 1  Copy design 1  Total score 30        Immunization History  Administered Date(s) Administered  . Influenza Split 04/06/2011, 07/18/2012  . Influenza,inj,Quad PF,36+ Mos 03/13/2013, 04/18/2014, 03/27/2015  . Pneumococcal Conjugate-13 07/26/2013  . Pneumococcal Polysaccharide-23 06/25/2015  . Tdap 04/06/2011   Screening Tests Health Maintenance  Topic Date Due  . FOOT EXAM  02/13/1948  . ZOSTAVAX  02/12/1998  . INFLUENZA VACCINE  02/02/2016  . HEMOGLOBIN A1C  05/25/2016  . URINE MICROALBUMIN  11/22/2016    . OPHTHALMOLOGY EXAM  02/08/2017  . TETANUS/TDAP  04/05/2021  . DEXA SCAN  Completed  . PNA vac Low Risk Adult  Completed      Plan:     Follow up with Dr.Blyth as scheduled 08/02/16.  Go to lab today.  Schedule Mammogram and Bone Density Screening after 08/25/16.  Continue to eat heart healthy diet (full of fruits, vegetables, whole grains, lean protein, water--limit salt, fat, and sugar intake) and increase physical activity as tolerated.  Continue doing brain stimulating  activities (puzzles, reading, adult coloring books, staying active) to keep memory sharp.    During the course of the visit the patient was educated and counseled about the following appropriate screening and preventive services:   Vaccines to include Pneumoccal, Influenza, Hepatitis B, Td, Zostavax, HCV  Cardiovascular Disease  Colorectal cancer screening  Bone density screening  Diabetes screening  Glaucoma screening  Mammography/PAP  Nutrition counseling   Patient Instructions (the written plan) was given to the patient.   Shela Nevin, [place]  07/28/2016

## 2016-07-27 NOTE — Progress Notes (Signed)
Pre visit review using our clinic review tool, if applicable. No additional management support is needed unless otherwise documented below in the visit note. 

## 2016-07-28 ENCOUNTER — Ambulatory Visit (INDEPENDENT_AMBULATORY_CARE_PROVIDER_SITE_OTHER): Payer: Medicare HMO | Admitting: *Deleted

## 2016-07-28 ENCOUNTER — Encounter: Payer: Self-pay | Admitting: *Deleted

## 2016-07-28 VITALS — BP 138/82 | HR 63 | Ht 60.0 in | Wt 133.8 lb

## 2016-07-28 DIAGNOSIS — Z1239 Encounter for other screening for malignant neoplasm of breast: Secondary | ICD-10-CM

## 2016-07-28 DIAGNOSIS — Z1231 Encounter for screening mammogram for malignant neoplasm of breast: Secondary | ICD-10-CM | POA: Diagnosis not present

## 2016-07-28 DIAGNOSIS — E785 Hyperlipidemia, unspecified: Secondary | ICD-10-CM

## 2016-07-28 DIAGNOSIS — E1310 Other specified diabetes mellitus with ketoacidosis without coma: Secondary | ICD-10-CM

## 2016-07-28 DIAGNOSIS — Z0000 Encounter for general adult medical examination without abnormal findings: Secondary | ICD-10-CM | POA: Diagnosis not present

## 2016-07-28 DIAGNOSIS — E1110 Type 2 diabetes mellitus with ketoacidosis without coma: Secondary | ICD-10-CM

## 2016-07-28 DIAGNOSIS — Z780 Asymptomatic menopausal state: Secondary | ICD-10-CM | POA: Diagnosis not present

## 2016-07-28 DIAGNOSIS — I10 Essential (primary) hypertension: Secondary | ICD-10-CM | POA: Diagnosis not present

## 2016-07-28 DIAGNOSIS — H547 Unspecified visual loss: Secondary | ICD-10-CM | POA: Diagnosis not present

## 2016-07-28 DIAGNOSIS — R7989 Other specified abnormal findings of blood chemistry: Secondary | ICD-10-CM

## 2016-07-28 DIAGNOSIS — E559 Vitamin D deficiency, unspecified: Secondary | ICD-10-CM

## 2016-07-28 DIAGNOSIS — R945 Abnormal results of liver function studies: Secondary | ICD-10-CM

## 2016-07-28 DIAGNOSIS — E119 Type 2 diabetes mellitus without complications: Secondary | ICD-10-CM

## 2016-07-28 LAB — COMPREHENSIVE METABOLIC PANEL
ALT: 24 U/L (ref 0–35)
AST: 37 U/L (ref 0–37)
Albumin: 4.2 g/dL (ref 3.5–5.2)
Alkaline Phosphatase: 80 U/L (ref 39–117)
BUN: 10 mg/dL (ref 6–23)
CO2: 27 mEq/L (ref 19–32)
Calcium: 9.6 mg/dL (ref 8.4–10.5)
Chloride: 104 mEq/L (ref 96–112)
Creatinine, Ser: 0.7 mg/dL (ref 0.40–1.20)
GFR: 85.91 mL/min (ref >60.00)
Glucose, Bld: 100 mg/dL — ABNORMAL HIGH (ref 70–99)
Potassium: 4 mEq/L (ref 3.5–5.1)
Sodium: 138 mEq/L (ref 135–145)
Total Bilirubin: 0.5 mg/dL (ref 0.2–1.2)
Total Protein: 7.2 g/dL (ref 6.0–8.3)

## 2016-07-28 LAB — MICROALBUMIN / CREATININE URINE RATIO
Creatinine,U: 24.6 mg/dL
Microalb Creat Ratio: 2.9 mg/g (ref 0.0–30.0)
Microalb, Ur: <0.7 mg/dL (ref 0.0–1.9)

## 2016-07-28 LAB — LIPID PANEL
Cholesterol: 150 mg/dL (ref 0–200)
HDL: 65.2 mg/dL (ref >39.00)
LDL Cholesterol: 65 mg/dL (ref 0–99)
NonHDL: 84.83
Total CHOL/HDL Ratio: 2
Triglycerides: 99 mg/dL (ref 0.0–149.0)
VLDL: 19.8 mg/dL (ref 0.0–40.0)

## 2016-07-28 LAB — CBC
HCT: 40.4 % (ref 36.0–46.0)
Hemoglobin: 13.6 g/dL (ref 12.0–15.0)
MCHC: 33.6 g/dL (ref 30.0–36.0)
MCV: 92 fl (ref 78.0–100.0)
Platelets: 192 10*3/uL (ref 150.0–400.0)
RBC: 4.39 Mil/uL (ref 3.87–5.11)
RDW: 14.1 % (ref 11.5–15.5)
WBC: 8 10*3/uL (ref 4.0–10.5)

## 2016-07-28 LAB — VITAMIN D 25 HYDROXY (VIT D DEFICIENCY, FRACTURES): VITD: 27.99 ng/mL — ABNORMAL LOW (ref 30.00–100.00)

## 2016-07-28 LAB — TSH: TSH: 2.07 u[IU]/mL (ref 0.35–4.50)

## 2016-07-28 LAB — HEMOGLOBIN A1C: Hgb A1c MFr Bld: 6.5 % (ref 4.6–6.5)

## 2016-07-28 NOTE — Addendum Note (Signed)
Addended by: Peggyann Shoals on: 07/28/2016 10:54 AM   Modules accepted: Orders

## 2016-07-28 NOTE — Patient Instructions (Addendum)
Follow up with Dr.Blyth as scheduled 08/02/16.  Schedule Mammogram and Bone Density Screening after 08/25/16.  Continue to eat heart healthy diet (full of fruits, vegetables, whole grains, lean protein, water--limit salt, fat, and sugar intake) and increase physical activity as tolerated.  Continue doing brain stimulating activities (puzzles, reading, adult coloring books, staying active) to keep memory sharp.    Bone Densitometry Introduction Bone densitometry is an imaging test that uses a special X-ray to measure the amount of calcium and other minerals in your bones (bone density). This test is also known as a bone mineral density test or dual-energy X-ray absorptiometry (DXA). The test can measure bone density at your hip and your spine. It is similar to having a regular X-ray. You may have this test to:  Diagnose a condition that causes weak or thin bones (osteoporosis).  Predict your risk of a broken bone (fracture).  Determine how well osteoporosis treatment is working. Tell a health care provider about:  Any allergies you have.  All medicines you are taking, including vitamins, herbs, eye drops, creams, and over-the-counter medicines.  Any problems you or family members have had with anesthetic medicines.  Any blood disorders you have.  Any surgeries you have had.  Any medical conditions you have.  Possibility of pregnancy.  Any other medical test you had within the previous 14 days that used contrast material. What are the risks? Generally, this is a safe procedure. However, problems can occur and may include the following:  This test exposes you to a very small amount of radiation.  The risks of radiation exposure may be greater to unborn children. What happens before the procedure?  Do not take any calcium supplements for 24 hours before having the test. You can otherwise eat and drink what you usually do.  Take off all metal jewelry, eyeglasses, dental  appliances, and any other metal objects. What happens during the procedure?  You may lie on an exam table. There will be an X-ray generator below you and an imaging device above you.  Other devices, such as boxes or braces, may be used to position your body properly for the scan.  You will need to lie still while the machine slowly scans your body.  The images will show up on a computer monitor. What happens after the procedure? You may need more testing at a later time. This information is not intended to replace advice given to you by your health care provider. Make sure you discuss any questions you have with your health care provider. Document Released: 07/12/2004 Document Revised: 11/26/2015 Document Reviewed: 11/28/2013  2017 Elsevier   Screening for Type 2 Diabetes A screening test for type 2 diabetes (type 2 diabetes mellitus) is a blood test to measure your blood sugar (glucose) level. This test is done to check for early signs of diabetes, before you develop symptoms. Type 2 diabetes is a long-term (chronic) disease that occurs when the pancreas does not make enough of a hormone called insulin. This results in high blood glucose levels, which can cause many complications. You may be screened for type 2 diabetes as part of your regular health care, especially if you have a high risk for diabetes. Screening can help identify type 2 diabetes at its early stage (prediabetes). Identifying and treating prediabetes may delay or prevent development of type 2 diabetes. What are the risk factors for type 2 diabetes? The following factors may make you more likely to develop type 2 diabetes:  Having  a parent or sibling (first-degree relative) who has diabetes.  Being overweight or obese.  Being of American-Indian, [place], Hispanic, Latino, Asian, or African-American descent.  Not getting enough exercise.  Being older than 42.  Having a history of diabetes during pregnancy  (gestational diabetes).  Having low levels of good cholesterol (HDL-C) or high levels of blood fats (triglycerides).  Having high blood glucose in a previous blood test.  Having high blood pressure.  Having certain diseases or conditions, including:  Acanthosis nigricans. This is a condition that causes dark skin on the neck, armpits, and groin.  Polycystic ovary syndrome (PCOS).  Heart disease.  Having delivered a baby who weighed more than 9 lb (4.1 kg). Who should be screened for type 2 diabetes? Adults  Adults age 70 and older. These adults should be screened at least once every three years.  Adults who are younger than 25, overweight, and have at least one other risk factor. These adults should be screened at least once every three years.  Adults who have normal blood glucose levels and two or more risk factors. These adults may be screened once every year (annually).  Women who have had gestational diabetes in the past. These women should be screened at least once every three years.  Pregnant women who have risk factors. These women should be screened at their first prenatal visit.  Pregnant women with no risk factors. These women should be screened between weeks 24 and 28 of pregnancy. Children and adolescents  Children and adolescents should be screened for type 2 diabetes if they are overweight and have 2 of the following risk factors:  A family history of type 2 diabetes.  Being a member of a high risk race or ethnic group.  Signs of insulin resistance or conditions associated with insulin resistance.  A mother who had gestational diabetes while pregnant with him or her.  Screening should be done at least once every three years, starting at age 85. Your health care provider or your child's health care provider may recommend having a screening more or less often. What happens during screening? During screening, your health care provider may ask questions  about:  Your health and your risk factors, including your activity level and any medical conditions that you have.  The health of your first-degree relatives.  Past pregnancies, if this applies. Your health care provider will also do a physical exam, including a blood pressure measurement and blood tests. There are four blood tests that can be used to screen for type 2 diabetes. You may have one or more of the following:  A fasting plasma glucose test (FBG). You will not be allowed to eat for at least eight hours before a blood sample is taken.  A random blood glucose test. This test checks your blood glucose at any time of the day regardless of when you ate.  An oral glucose tolerance test (OGTT). This test measures your blood glucose at two times:  After you have not eaten (have fasted) overnight.  Two hours after you drink a glucose-containing beverage. A diagnosis can be made if the level is greater than 200 mg/dL (11.1 mmol/L).  An A1c test. This test provides information about blood glucose control over the previous three months. What do the results mean? Your test results are a measurement of how much glucose is in your blood. Normal blood glucose levels mean that you do not have diabetes or prediabetes. High blood glucose levels may mean that  you have prediabetes or diabetes. Depending on the results, other tests may be needed to confirm the diagnosis. This information is not intended to replace advice given to you by your health care provider. Make sure you discuss any questions you have with your health care provider. Document Released: 04/16/2009 Document Revised: 11/26/2015 Document Reviewed: 04/17/2015 Elsevier Interactive Patient Education  2017 [place] Maintenance, Female Introduction Adopting a healthy lifestyle and getting preventive care can go a long way to promote health and wellness. Talk with your health care provider about what schedule of regular  examinations is right for you. This is a good chance for you to check in with your provider about disease prevention and staying healthy. In between checkups, there are plenty of things you can do on your own. Experts have done a lot of research about which lifestyle changes and preventive measures are most likely to keep you healthy. Ask your health care provider for more information. Weight and diet Eat a healthy diet  Be sure to include plenty of vegetables, fruits, low-fat dairy products, and lean protein.  Do not eat a lot of foods high in solid fats, added sugars, or salt.  Get regular exercise. This is one of the most important things you can do for your health.  Most adults should exercise for at least 150 minutes each week. The exercise should increase your heart rate and make you sweat (moderate-intensity exercise).  Most adults should also do strengthening exercises at least twice a week. This is in addition to the moderate-intensity exercise. Maintain a healthy weight  Body mass index (BMI) is a measurement that can be used to identify possible weight problems. It estimates body fat based on height and weight. Your health care provider can help determine your BMI and help you achieve or maintain a healthy weight.  For females 43 years of age and older:  A BMI below 18.5 is considered underweight.  A BMI of 18.5 to 24.9 is normal.  A BMI of 25 to 29.9 is considered overweight.  A BMI of 30 and above is considered obese. Watch levels of cholesterol and blood lipids  You should start having your blood tested for lipids and cholesterol at 79 years of age, then have this test every 5 years.  You may need to have your cholesterol levels checked more often if:  Your lipid or cholesterol levels are high.  You are older than 79 years of age.  You are at high risk for heart disease. Cancer screening Lung Cancer  Lung cancer screening is recommended for adults 45-80 years  old who are at high risk for lung cancer because of a history of smoking.  A yearly low-dose CT scan of the lungs is recommended for people who:  Currently smoke.  Have quit within the past 15 years.  Have at least a 30-pack-year history of smoking. A pack year is smoking an average of one pack of cigarettes a day for 1 year.  Yearly screening should continue until it has been 15 years since you quit.  Yearly screening should stop if you develop a health problem that would prevent you from having lung cancer treatment. Breast Cancer  Practice breast self-awareness. This means understanding how your breasts normally appear and feel.  It also means doing regular breast self-exams. Let your health care provider know about any changes, no matter how small.  If you are in your 20s or 30s, you should have a clinical breast exam (  CBE) by a health care provider every 1-3 years as part of a regular health exam.  If you are 23 or older, have a CBE every year. Also consider having a breast X-ray (mammogram) every year.  If you have a family history of breast cancer, talk to your health care provider about genetic screening.  If you are at high risk for breast cancer, talk to your health care provider about having an MRI and a mammogram every year.  Breast cancer gene (BRCA) assessment is recommended for women who have family members with BRCA-related cancers. BRCA-related cancers include:  Breast.  Ovarian.  Tubal.  Peritoneal cancers.  Results of the assessment will determine the need for genetic counseling and BRCA1 and BRCA2 testing. Cervical Cancer  Your health care provider may recommend that you be screened regularly for cancer of the pelvic organs (ovaries, uterus, and vagina). This screening involves a pelvic examination, including checking for microscopic changes to the surface of your cervix (Pap test). You may be encouraged to have this screening done every 3 years, beginning  at age 30.  For women ages 86-65, health care providers may recommend pelvic exams and Pap testing every 3 years, or they may recommend the Pap and pelvic exam, combined with testing for human papilloma virus (HPV), every 5 years. Some types of HPV increase your risk of cervical cancer. Testing for HPV may also be done on women of any age with unclear Pap test results.  Other health care providers may not recommend any screening for nonpregnant women who are considered low risk for pelvic cancer and who do not have symptoms. Ask your health care provider if a screening pelvic exam is right for you.  If you have had past treatment for cervical cancer or a condition that could lead to cancer, you need Pap tests and screening for cancer for at least 20 years after your treatment. If Pap tests have been discontinued, your risk factors (such as having a new sexual partner) need to be reassessed to determine if screening should resume. Some women have medical problems that increase the chance of getting cervical cancer. In these cases, your health care provider may recommend more frequent screening and Pap tests. Colorectal Cancer  This type of cancer can be detected and often prevented.  Routine colorectal cancer screening usually begins at 79 years of age and continues through 79 years of age.  Your health care provider may recommend screening at an earlier age if you have risk factors for colon cancer.  Your health care provider may also recommend using home test kits to check for hidden blood in the stool.  A small camera at the end of a tube can be used to examine your colon directly (sigmoidoscopy or colonoscopy). This is done to check for the earliest forms of colorectal cancer.  Routine screening usually begins at age 46.  Direct examination of the colon should be repeated every 5-10 years through 79 years of age. However, you may need to be screened more often if early forms of precancerous  polyps or small growths are found. Skin Cancer  Check your skin from head to toe regularly.  Tell your health care provider about any new moles or changes in moles, especially if there is a change in a mole's shape or color.  Also tell your health care provider if you have a mole that is larger than the size of a pencil eraser.  Always use sunscreen. Apply sunscreen liberally and repeatedly  throughout the day.  Protect yourself by wearing long sleeves, pants, a wide-brimmed hat, and sunglasses whenever you are outside. Heart disease, diabetes, and high blood pressure  High blood pressure causes heart disease and increases the risk of stroke. High blood pressure is more likely to develop in:  People who have blood pressure in the high end of the normal range (130-139/85-89 mm Hg).  People who are overweight or obese.  People who are African American.  If you are 19-33 years of age, have your blood pressure checked every 3-5 years. If you are 26 years of age or older, have your blood pressure checked every year. You should have your blood pressure measured twice-once when you are at a hospital or clinic, and once when you are not at a hospital or clinic. Record the average of the two measurements. To check your blood pressure when you are not at a hospital or clinic, you can use:  An automated blood pressure machine at a pharmacy.  A home blood pressure monitor.  If you are between 60 years and 59 years old, ask your health care provider if you should take aspirin to prevent strokes.  Have regular diabetes screenings. This involves taking a blood sample to check your fasting blood sugar level.  If you are at a normal weight and have a low risk for diabetes, have this test once every three years after 79 years of age.  If you are overweight and have a high risk for diabetes, consider being tested at a younger age or more often. Preventing infection Hepatitis B  If you have a higher  risk for hepatitis B, you should be screened for this virus. You are considered at high risk for hepatitis B if:  You were born in a country where hepatitis B is common. Ask your health care provider which countries are considered high risk.  Your parents were born in a high-risk country, and you have not been immunized against hepatitis B (hepatitis B vaccine).  You have HIV or AIDS.  You use needles to inject street drugs.  You live with someone who has hepatitis B.  You have had sex with someone who has hepatitis B.  You get hemodialysis treatment.  You take certain medicines for conditions, including cancer, organ transplantation, and autoimmune conditions. Hepatitis C  Blood testing is recommended for:  Everyone born from [birth year] through 1963.  Anyone with known risk factors for hepatitis C. Sexually transmitted infections (STIs)  You should be screened for sexually transmitted infections (STIs) including gonorrhea and chlamydia if:  You are sexually active and are younger than 79 years of age.  You are older than 80 years of age and your health care provider tells you that you are at risk for this type of infection.  Your sexual activity has changed since you were last screened and you are at an increased risk for chlamydia or gonorrhea. Ask your health care provider if you are at risk.  If you do not have HIV, but are at risk, it may be recommended that you take a prescription medicine daily to prevent HIV infection. This is called pre-exposure prophylaxis (PrEP). You are considered at risk if:  You are sexually active and do not regularly use condoms or know the HIV status of your partner(s).  You take drugs by injection.  You are sexually active with a partner who has HIV. Talk with your health care provider about whether you are at high risk of being  infected with HIV. If you choose to begin PrEP, you should first be tested for HIV. You should then be tested every 3  months for as long as you are taking PrEP. Pregnancy  If you are premenopausal and you may become pregnant, ask your health care provider about preconception counseling.  If you may become pregnant, take 400 to 800 micrograms (mcg) of folic acid every day.  If you want to prevent pregnancy, talk to your health care provider about birth control (contraception). Osteoporosis and menopause  Osteoporosis is a disease in which the bones lose minerals and strength with aging. This can result in serious bone fractures. Your risk for osteoporosis can be identified using a bone density scan.  If you are 25 years of age or older, or if you are at risk for osteoporosis and fractures, ask your health care provider if you should be screened.  Ask your health care provider whether you should take a calcium or vitamin D supplement to lower your risk for osteoporosis.  Menopause may have certain physical symptoms and risks.  Hormone replacement therapy may reduce some of these symptoms and risks. Talk to your health care provider about whether hormone replacement therapy is right for you. Follow these instructions at home:  Schedule regular health, dental, and eye exams.  Stay current with your immunizations.  Do not use any tobacco products including cigarettes, chewing tobacco, or electronic cigarettes.  If you are pregnant, do not drink alcohol.  If you are breastfeeding, limit how much and how often you drink alcohol.  Limit alcohol intake to no more than 1 drink per day for nonpregnant women. One drink equals 12 ounces of beer, 5 ounces of wine, or 1 ounces of hard liquor.  Do not use street drugs.  Do not share needles.  Ask your health care provider for help if you need support or information about quitting drugs.  Tell your health care provider if you often feel depressed.  Tell your health care provider if you have ever been abused or do not feel safe at home. This information is  not intended to replace advice given to you by your health care provider. Make sure you discuss any questions you have with your health care provider. Document Released: 01/03/2011 Document Revised: 11/26/2015 Document Reviewed: 03/24/2015  2017 Elsevier   Mammogram A mammogram is an X-ray of the breasts that is done to check for abnormal changes. This procedure can screen for and detect any changes that may suggest breast cancer. A mammogram can also identify other changes and variations in the breast, such as:  Inflammation of the breast tissue (mastitis).  An infected area that contains a collection of pus (abscess).  A fluid-filled sac (cyst).  Fibrocystic changes. This is when breast tissue becomes denser, which can make the tissue feel rope-like or uneven under the skin.  Tumors that are not cancerous (benign). Tell a health care provider about:  Any allergies you have.  If you have breast implants.  If you have had previous breast disease, biopsy, or surgery.  If you are breastfeeding.  Any possibility that you could be pregnant, if this applies.  If you are younger than age 30.  If you have a family history of breast cancer. What are the risks? Generally, this is a safe procedure. However, problems may occur, including:  Exposure to radiation. Radiation levels are very low with this test.  The results being misinterpreted.  The need for further tests.  The  inability of the mammogram to detect certain cancers. What happens before the procedure?  Schedule your test about 1-2 weeks after your menstrual period. This is usually when your breasts are the least tender.  If you have had a mammogram done at a different facility in the past, get the mammogram X-rays or have them sent to your current exam facility in order to compare them.  Wash your breasts and under your arms the day of the test.  Do not wear deodorants, perfumes, lotions, or powders anywhere on your  body on the day of the test.  Remove any jewelry from your neck.  Wear clothes that you can change into and out of easily. What happens during the procedure?  You will undress from the waist up and put on a gown.  You will stand in front of the X-ray machine.  Each breast will be placed between two plastic or glass plates. The plates will compress your breast for a few seconds. Try to stay as relaxed as possible during the procedure. This does not cause any harm to your breasts and any discomfort you feel will be very brief.  X-rays will be taken from different angles of each breast. The procedure may vary among health care providers and hospitals. What happens after the procedure?  The mammogram will be examined by a specialist (radiologist).  You may need to repeat certain parts of the test, depending on the quality of the images. This is commonly done if the radiologist needs a better view of the breast tissue.  Ask when your test results will be ready. Make sure you get your test results.  You may resume your normal activities. This information is not intended to replace advice given to you by your health care provider. Make sure you discuss any questions you have with your health care provider. Document Released: 06/17/2000 Document Revised: 11/23/2015 Document Reviewed: 08/29/2014 Elsevier Interactive Patient Education  2017 [employer].

## 2016-07-28 NOTE — Progress Notes (Signed)
RN blood AWV note reviewed. Agree with documention and plan.

## 2016-07-29 ENCOUNTER — Other Ambulatory Visit: Payer: Self-pay | Admitting: Family Medicine

## 2016-07-29 MED ORDER — VITAMIN D (ERGOCALCIFEROL) 1.25 MG (50000 UNIT) PO CAPS: 50000.0000 [IU] | ORAL_CAPSULE | ORAL | 0 refills | Status: DC

## 2016-08-02 ENCOUNTER — Telehealth: Payer: Self-pay | Admitting: Family Medicine

## 2016-08-02 ENCOUNTER — Encounter: Payer: Commercial Managed Care - HMO | Admitting: Family Medicine

## 2016-08-02 NOTE — Telephone Encounter (Signed)
No charge. 

## 2016-08-02 NOTE — Telephone Encounter (Signed)
Pt lvm at 7a to reschedule her appt. Pt says that she cant drive in the snow.

## 2016-08-29 ENCOUNTER — Ambulatory Visit (HOSPITAL_BASED_OUTPATIENT_CLINIC_OR_DEPARTMENT_OTHER)
Admission: RE | Admit: 2016-08-29 | Discharge: 2016-08-29 | Disposition: A | Discharge status: Home / Self Care | Payer: Medicare HMO | Source: Ambulatory Visit | Attending: Family Medicine | Admitting: Family Medicine

## 2016-08-29 DIAGNOSIS — Z780 Asymptomatic menopausal state: Secondary | ICD-10-CM | POA: Diagnosis not present

## 2016-08-29 DIAGNOSIS — M810 Age-related osteoporosis without current pathological fracture: Secondary | ICD-10-CM | POA: Diagnosis not present

## 2016-08-29 DIAGNOSIS — Z1239 Encounter for other screening for malignant neoplasm of breast: Secondary | ICD-10-CM

## 2016-08-30 ENCOUNTER — Other Ambulatory Visit: Payer: Self-pay | Admitting: Family Medicine

## 2016-08-30 MED ORDER — ALENDRONATE SODIUM 70 MG PO TABS: 70.0000 mg | ORAL_TABLET | ORAL | 4 refills | Status: DC

## 2016-09-05 ENCOUNTER — Encounter (HOSPITAL_BASED_OUTPATIENT_CLINIC_OR_DEPARTMENT_OTHER): Payer: Self-pay

## 2016-09-05 ENCOUNTER — Ambulatory Visit (HOSPITAL_BASED_OUTPATIENT_CLINIC_OR_DEPARTMENT_OTHER)
Admission: RE | Admit: 2016-09-05 | Discharge: 2016-09-05 | Disposition: A | Discharge status: Home / Self Care | Payer: Medicare HMO | Source: Ambulatory Visit | Attending: Family Medicine | Admitting: Family Medicine

## 2016-09-05 DIAGNOSIS — Z1231 Encounter for screening mammogram for malignant neoplasm of breast: Secondary | ICD-10-CM | POA: Diagnosis not present

## 2016-09-06 ENCOUNTER — Other Ambulatory Visit: Payer: Self-pay | Admitting: Family Medicine

## 2016-09-06 DIAGNOSIS — R928 Other abnormal and inconclusive findings on diagnostic imaging of breast: Secondary | ICD-10-CM

## 2016-09-09 ENCOUNTER — Other Ambulatory Visit: Payer: Self-pay | Admitting: Family Medicine

## 2016-09-09 ENCOUNTER — Ambulatory Visit
Admission: RE | Admit: 2016-09-09 | Discharge: 2016-09-09 | Disposition: A | Discharge status: Home / Self Care | Payer: Medicare HMO | Source: Ambulatory Visit | Attending: Family Medicine | Admitting: Family Medicine

## 2016-09-09 DIAGNOSIS — R928 Other abnormal and inconclusive findings on diagnostic imaging of breast: Secondary | ICD-10-CM

## 2016-09-09 DIAGNOSIS — N6489 Other specified disorders of breast: Secondary | ICD-10-CM | POA: Diagnosis not present

## 2016-09-09 DIAGNOSIS — R921 Mammographic calcification found on diagnostic imaging of breast: Secondary | ICD-10-CM | POA: Diagnosis not present

## 2016-09-09 DIAGNOSIS — N6320 Unspecified lump in the left breast, unspecified quadrant: Secondary | ICD-10-CM

## 2016-09-12 ENCOUNTER — Other Ambulatory Visit: Payer: Medicare HMO

## 2016-09-15 ENCOUNTER — Other Ambulatory Visit: Payer: Self-pay | Admitting: Family Medicine

## 2016-09-15 ENCOUNTER — Ambulatory Visit
Admission: RE | Admit: 2016-09-15 | Discharge: 2016-09-15 | Disposition: A | Discharge status: Home / Self Care | Payer: Medicare HMO | Source: Ambulatory Visit | Attending: Family Medicine | Admitting: Family Medicine

## 2016-09-15 DIAGNOSIS — R928 Other abnormal and inconclusive findings on diagnostic imaging of breast: Secondary | ICD-10-CM | POA: Diagnosis not present

## 2016-09-15 DIAGNOSIS — N6320 Unspecified lump in the left breast, unspecified quadrant: Secondary | ICD-10-CM

## 2016-09-15 DIAGNOSIS — D242 Benign neoplasm of left breast: Secondary | ICD-10-CM | POA: Diagnosis not present

## 2016-09-28 ENCOUNTER — Ambulatory Visit: Payer: Self-pay | Admitting: General Surgery

## 2016-09-28 DIAGNOSIS — N6092 Unspecified benign mammary dysplasia of left breast: Secondary | ICD-10-CM

## 2016-10-06 ENCOUNTER — Other Ambulatory Visit: Payer: Self-pay | Admitting: General Surgery

## 2016-10-06 DIAGNOSIS — N6092 Unspecified benign mammary dysplasia of left breast: Secondary | ICD-10-CM

## 2016-10-07 ENCOUNTER — Other Ambulatory Visit: Payer: Self-pay | Admitting: Family Medicine

## 2016-11-09 ENCOUNTER — Encounter (HOSPITAL_COMMUNITY)
Admission: RE | Admit: 2016-11-09 | Discharge: 2016-11-09 | Disposition: A | Discharge status: Home / Self Care | Payer: Medicare HMO | Source: Ambulatory Visit | Attending: General Surgery | Admitting: General Surgery

## 2016-11-09 ENCOUNTER — Encounter (HOSPITAL_COMMUNITY): Payer: Self-pay

## 2016-11-09 DIAGNOSIS — Z01812 Encounter for preprocedural laboratory examination: Secondary | ICD-10-CM | POA: Insufficient documentation

## 2016-11-09 DIAGNOSIS — Z01810 Encounter for preprocedural cardiovascular examination: Secondary | ICD-10-CM | POA: Insufficient documentation

## 2016-11-09 DIAGNOSIS — E119 Type 2 diabetes mellitus without complications: Secondary | ICD-10-CM | POA: Diagnosis not present

## 2016-11-09 LAB — BASIC METABOLIC PANEL
Anion gap: 9 (ref 5–15)
BUN: 11 mg/dL (ref 6–20)
CO2: 24 mmol/L (ref 22–32)
Calcium: 9.8 mg/dL (ref 8.9–10.3)
Chloride: 105 mmol/L (ref 101–111)
Creatinine, Ser: 0.8 mg/dL (ref 0.44–1.00)
GFR calc Af Amer: >60 mL/min (ref >60)
GFR calc non Af Amer: >60 mL/min (ref >60)
Glucose, Bld: 94 mg/dL (ref 65–99)
Potassium: 4 mmol/L (ref 3.5–5.1)
Sodium: 138 mmol/L (ref 135–145)

## 2016-11-09 LAB — CBC
HCT: 44.3 % (ref 36.0–46.0)
Hemoglobin: 14.4 g/dL (ref 12.0–15.0)
MCH: 30.4 pg (ref 26.0–34.0)
MCHC: 32.5 g/dL (ref 30.0–36.0)
MCV: 93.5 fL (ref 78.0–100.0)
Platelets: 223 10*3/uL (ref 150–400)
RBC: 4.74 MIL/uL (ref 3.87–5.11)
RDW: 13.3 % (ref 11.5–15.5)
WBC: 7.2 10*3/uL (ref 4.0–10.5)

## 2016-11-09 LAB — GLUCOSE, CAPILLARY: Glucose-Capillary: 87 mg/dL (ref 65–99)

## 2016-11-09 NOTE — Pre-Procedure Instructions (Signed)
    Natalie Swanson  11/09/2016      [id number] - [place], [place] - [address] [place] [address] Phone: [phone] Fax: [facility] - [place], [place] [address] Phone: [phone] Fax: [phone]    Your procedure is scheduled on May 16.  Report to [facility] Admitting at 6:30 A.M.  Call this number if you have problems the morning of surgery:  [phone]   Remember:  Do not eat food or drink liquids after midnight.  Take these medicines the morning of surgery with A SIP OF WATER :propranolol (INDERAL)   STOP aspirin, herbal medications, vitamins, fish oil as of today    Drink 8oz bottle water by 4:30 am   Do not wear jewelry, make-up or nail polish.  Do not wear lotions, powders, or perfumes, or deoderant.  Do not shave 48 hours prior to surgery.   Do not bring valuables to the hospital.  [facility] is not responsible for any belongings or valuables.  Contacts, dentures or bridgework may not be worn into surgery.  Leave your suitcase in the car.  After surgery it may be brought to your room.  For patients admitted to the hospital, discharge time will be determined by your treatment team.  Patients discharged the day of surgery will not be allowed to drive home.   Name and phone number of your driver:    Special instructions:  Preparing for surgery  Please read over the following fact sheets that you were given. Pain Booklet and Surgical Site Infection Prevention

## 2016-11-10 LAB — HEMOGLOBIN A1C
Hgb A1c MFr Bld: 6.5 % — ABNORMAL HIGH (ref 4.8–5.6)
Mean Plasma Glucose: 140 mg/dL

## 2016-11-14 ENCOUNTER — Ambulatory Visit
Admission: RE | Admit: 2016-11-14 | Discharge: 2016-11-14 | Disposition: A | Discharge status: Home / Self Care | Payer: Medicare HMO | Source: Ambulatory Visit | Attending: General Surgery | Admitting: General Surgery

## 2016-11-14 DIAGNOSIS — N62 Hypertrophy of breast: Secondary | ICD-10-CM | POA: Diagnosis not present

## 2016-11-14 DIAGNOSIS — N6092 Unspecified benign mammary dysplasia of left breast: Secondary | ICD-10-CM

## 2016-11-15 NOTE — Anesthesia Preprocedure Evaluation (Addendum)
Anesthesia Evaluation  Patient identified by MRN, date of birth, ID band Patient awake    Reviewed: Allergy & Precautions, NPO status , Patient's Chart, lab work & pertinent test results  History of Anesthesia Complications Negative for: history of anesthetic complications  Airway Mallampati: II  TM Distance: >3 FB Neck ROM: Full    Dental no notable dental hx. (+) Dental Advisory Given   Pulmonary former smoker,    Pulmonary exam normal        Cardiovascular hypertension, Pt. on medications and Pt. on home beta blockers Normal cardiovascular exam     Neuro/Psych negative neurological ROS  negative psych ROS   GI/Hepatic negative GI ROS, Neg liver ROS,   Endo/Other  diabetes  Renal/GU negative Renal ROS     Musculoskeletal negative musculoskeletal ROS (+)   Abdominal   Peds  Hematology negative hematology ROS (+)   Anesthesia Other Findings Day of surgery medications reviewed with the patient.  Reproductive/Obstetrics                            Anesthesia Physical Anesthesia Plan  ASA: III  Anesthesia Plan: General   Post-op Pain Management:    Induction: Intravenous  Airway Management Planned: LMA  Additional Equipment:   Intra-op Plan:   Post-operative Plan: Extubation in OR  Informed Consent: I have reviewed the patients History and Physical, chart, labs and discussed the procedure including the risks, benefits and alternatives for the proposed anesthesia with the patient or authorized representative who has indicated his/her understanding and acceptance.   Dental advisory given  Plan Discussed with: CRNA and Anesthesiologist  Anesthesia Plan Comments:        Anesthesia Quick Evaluation

## 2016-11-16 ENCOUNTER — Encounter (HOSPITAL_COMMUNITY): Payer: Self-pay

## 2016-11-16 ENCOUNTER — Ambulatory Visit (HOSPITAL_COMMUNITY)
Admission: RE | Admit: 2016-11-16 | Discharge: 2016-11-16 | Disposition: A | Discharge status: Home / Self Care | Payer: Medicare HMO | Source: Ambulatory Visit | Attending: General Surgery | Admitting: General Surgery

## 2016-11-16 ENCOUNTER — Ambulatory Visit (HOSPITAL_COMMUNITY): Payer: Medicare HMO | Admitting: Anesthesiology

## 2016-11-16 ENCOUNTER — Encounter (HOSPITAL_COMMUNITY)
Admission: RE | Disposition: A | Discharge status: Home / Self Care | Payer: Self-pay | Source: Ambulatory Visit | Attending: General Surgery

## 2016-11-16 ENCOUNTER — Ambulatory Visit
Admission: RE | Admit: 2016-11-16 | Discharge: 2016-11-16 | Disposition: A | Discharge status: Home / Self Care | Payer: Medicare HMO | Source: Ambulatory Visit | Attending: General Surgery | Admitting: General Surgery

## 2016-11-16 DIAGNOSIS — E119 Type 2 diabetes mellitus without complications: Secondary | ICD-10-CM | POA: Insufficient documentation

## 2016-11-16 DIAGNOSIS — Z7983 Long term (current) use of bisphosphonates: Secondary | ICD-10-CM | POA: Diagnosis not present

## 2016-11-16 DIAGNOSIS — N6089 Other benign mammary dysplasias of unspecified breast: Secondary | ICD-10-CM | POA: Diagnosis not present

## 2016-11-16 DIAGNOSIS — R921 Mammographic calcification found on diagnostic imaging of breast: Secondary | ICD-10-CM | POA: Diagnosis not present

## 2016-11-16 DIAGNOSIS — N6092 Unspecified benign mammary dysplasia of left breast: Secondary | ICD-10-CM

## 2016-11-16 DIAGNOSIS — E559 Vitamin D deficiency, unspecified: Secondary | ICD-10-CM | POA: Insufficient documentation

## 2016-11-16 DIAGNOSIS — R928 Other abnormal and inconclusive findings on diagnostic imaging of breast: Secondary | ICD-10-CM | POA: Diagnosis not present

## 2016-11-16 DIAGNOSIS — Z79899 Other long term (current) drug therapy: Secondary | ICD-10-CM | POA: Diagnosis not present

## 2016-11-16 DIAGNOSIS — Z7982 Long term (current) use of aspirin: Secondary | ICD-10-CM | POA: Insufficient documentation

## 2016-11-16 DIAGNOSIS — E785 Hyperlipidemia, unspecified: Secondary | ICD-10-CM | POA: Insufficient documentation

## 2016-11-16 DIAGNOSIS — Z87891 Personal history of nicotine dependence: Secondary | ICD-10-CM | POA: Insufficient documentation

## 2016-11-16 DIAGNOSIS — N6082 Other benign mammary dysplasias of left breast: Secondary | ICD-10-CM | POA: Diagnosis not present

## 2016-11-16 DIAGNOSIS — I10 Essential (primary) hypertension: Secondary | ICD-10-CM | POA: Insufficient documentation

## 2016-11-16 DIAGNOSIS — D242 Benign neoplasm of left breast: Secondary | ICD-10-CM | POA: Diagnosis not present

## 2016-11-16 DIAGNOSIS — N6320 Unspecified lump in the left breast, unspecified quadrant: Secondary | ICD-10-CM | POA: Diagnosis present

## 2016-11-16 DIAGNOSIS — N6012 Diffuse cystic mastopathy of left breast: Secondary | ICD-10-CM | POA: Diagnosis not present

## 2016-11-16 HISTORY — PX: BREAST LUMPECTOMY WITH RADIOACTIVE SEED LOCALIZATION: SHX6424

## 2016-11-16 LAB — GLUCOSE, CAPILLARY: Glucose-Capillary: 99 mg/dL (ref 65–99)

## 2016-11-16 SURGERY — BREAST LUMPECTOMY WITH RADIOACTIVE SEED LOCALIZATION
Anesthesia: General | Site: Breast | Laterality: Left

## 2016-11-16 MED ORDER — PROMETHAZINE HCL 25 MG/ML IJ SOLN: 6.2500 mg | INTRAMUSCULAR | Status: DC | PRN

## 2016-11-16 MED ORDER — MIDAZOLAM HCL 2 MG/2ML IJ SOLN
INTRAMUSCULAR | Status: AC
  Filled 2016-11-16: qty 2

## 2016-11-16 MED ORDER — BUPIVACAINE HCL (PF) 0.25 % IJ SOLN
INTRAMUSCULAR | Status: AC
  Filled 2016-11-16: qty 30

## 2016-11-16 MED ORDER — MIDAZOLAM HCL 5 MG/5ML IJ SOLN
INTRAMUSCULAR | Status: DC | PRN
  Administered 2016-11-16: 2 mg via INTRAVENOUS

## 2016-11-16 MED ORDER — BUPIVACAINE-EPINEPHRINE 0.25% -1:200000 IJ SOLN
INTRAMUSCULAR | Status: DC | PRN
  Administered 2016-11-16: 20 mL

## 2016-11-16 MED ORDER — ONDANSETRON HCL 4 MG/2ML IJ SOLN
INTRAMUSCULAR | Status: DC | PRN
  Administered 2016-11-16: 4 mg via INTRAVENOUS

## 2016-11-16 MED ORDER — FENTANYL CITRATE (PF) 100 MCG/2ML IJ SOLN
INTRAMUSCULAR | Status: DC | PRN
Start: 2016-11-16 — End: 2016-11-16
  Administered 2016-11-16 (×2): 25 ug via INTRAVENOUS
  Administered 2016-11-16: 100 ug via INTRAVENOUS

## 2016-11-16 MED ORDER — CEFAZOLIN SODIUM-DEXTROSE 2-4 GM/100ML-% IV SOLN
2.0000 g | INTRAVENOUS | Status: AC
  Administered 2016-11-16: 2 g via INTRAVENOUS
  Filled 2016-11-16: qty 100

## 2016-11-16 MED ORDER — ACETAMINOPHEN 500 MG PO TABS
1000.0000 mg | ORAL_TABLET | ORAL | Status: AC
  Administered 2016-11-16: 1000 mg via ORAL
  Filled 2016-11-16: qty 2

## 2016-11-16 MED ORDER — DEXAMETHASONE SODIUM PHOSPHATE 10 MG/ML IJ SOLN
INTRAMUSCULAR | Status: DC | PRN
  Administered 2016-11-16: 10 mg via INTRAVENOUS

## 2016-11-16 MED ORDER — HYDROCODONE-ACETAMINOPHEN 5-325 MG PO TABS: 1.0000 | ORAL_TABLET | ORAL | 0 refills | Status: DC | PRN

## 2016-11-16 MED ORDER — CHLORHEXIDINE GLUCONATE CLOTH 2 % EX PADS: 6.0000 | MEDICATED_PAD | Freq: Once | CUTANEOUS | Status: DC

## 2016-11-16 MED ORDER — FENTANYL CITRATE (PF) 250 MCG/5ML IJ SOLN
INTRAMUSCULAR | Status: AC
  Filled 2016-11-16: qty 5

## 2016-11-16 MED ORDER — PHENYLEPHRINE HCL 10 MG/ML IJ SOLN
INTRAMUSCULAR | Status: DC | PRN
  Administered 2016-11-16 (×3): 80 ug via INTRAVENOUS
  Administered 2016-11-16: 120 ug via INTRAVENOUS

## 2016-11-16 MED ORDER — PROPOFOL 10 MG/ML IV BOLUS
INTRAVENOUS | Status: DC | PRN
Start: 2016-11-16 — End: 2016-11-16
  Administered 2016-11-16: 150 mg via INTRAVENOUS

## 2016-11-16 MED ORDER — LIDOCAINE 2% (20 MG/ML) 5 ML SYRINGE
INTRAMUSCULAR | Status: DC | PRN
  Administered 2016-11-16: 100 mg via INTRAVENOUS

## 2016-11-16 MED ORDER — PROPOFOL 10 MG/ML IV BOLUS
INTRAVENOUS | Status: AC
  Filled 2016-11-16: qty 40

## 2016-11-16 MED ORDER — LACTATED RINGERS IV SOLN
INTRAVENOUS | Status: DC | PRN
  Administered 2016-11-16 (×2): via INTRAVENOUS

## 2016-11-16 MED ORDER — GABAPENTIN 300 MG PO CAPS
300.0000 mg | ORAL_CAPSULE | ORAL | Status: AC
  Administered 2016-11-16: 300 mg via ORAL
  Filled 2016-11-16: qty 1

## 2016-11-16 MED ORDER — 0.9 % SODIUM CHLORIDE (POUR BTL) OPTIME
TOPICAL | Status: DC | PRN
Start: 2016-11-16 — End: 2016-11-16
  Administered 2016-11-16: 1000 mL

## 2016-11-16 MED ORDER — HYDROMORPHONE HCL 1 MG/ML IJ SOLN: 0.2500 mg | INTRAMUSCULAR | Status: DC | PRN

## 2016-11-16 SURGICAL SUPPLY — 38 items
ADH SKN CLS APL DERMABOND .7 (GAUZE/BANDAGES/DRESSINGS) ×1
APPLIER CLIP 9.375 MED OPEN (MISCELLANEOUS)
APR CLP MED 9.3 20 MLT OPN (MISCELLANEOUS)
BLADE SURG 15 STRL LF DISP TIS (BLADE) ×1 IMPLANT
BLADE SURG 15 STRL SS (BLADE) ×2
CANISTER SUCT 3000ML PPV (MISCELLANEOUS) ×2 IMPLANT
CHLORAPREP W/TINT 26ML (MISCELLANEOUS) ×2 IMPLANT
CLIP APPLIE 9.375 MED OPEN (MISCELLANEOUS) IMPLANT
COVER PROBE W GEL 5X96 (DRAPES) ×2 IMPLANT
COVER SURGICAL LIGHT HANDLE (MISCELLANEOUS) ×2 IMPLANT
DERMABOND ADVANCED (GAUZE/BANDAGES/DRESSINGS) ×1
DERMABOND ADVANCED .7 DNX12 (GAUZE/BANDAGES/DRESSINGS) ×1 IMPLANT
DEVICE DUBIN SPECIMEN MAMMOGRA (MISCELLANEOUS) ×2 IMPLANT
DRAPE CHEST BREAST 15X10 FENES (DRAPES) ×2 IMPLANT
DRAPE UTILITY XL STRL (DRAPES) ×2 IMPLANT
ELECT COATED BLADE 2.86 ST (ELECTRODE) ×2 IMPLANT
ELECT REM PT RETURN 9FT ADLT (ELECTROSURGICAL) ×2
ELECTRODE REM PT RTRN 9FT ADLT (ELECTROSURGICAL) ×1 IMPLANT
GLOVE BIO SURGEON STRL SZ7.5 (GLOVE) ×4 IMPLANT
GOWN STRL REUS W/ TWL LRG LVL3 (GOWN DISPOSABLE) ×2 IMPLANT
GOWN STRL REUS W/TWL LRG LVL3 (GOWN DISPOSABLE) ×4
KIT BASIN OR (CUSTOM PROCEDURE TRAY) ×2 IMPLANT
KIT MARKER MARGIN INK (KITS) ×2 IMPLANT
LIGHT WAVEGUIDE WIDE FLAT (MISCELLANEOUS) IMPLANT
NEEDLE HYPO 25X1 1.5 SAFETY (NEEDLE) ×2 IMPLANT
NS IRRIG 1000ML POUR BTL (IV SOLUTION) ×2 IMPLANT
PACK SURGICAL SETUP 50X90 (CUSTOM PROCEDURE TRAY) ×2 IMPLANT
PENCIL BUTTON HOLSTER BLD 10FT (ELECTRODE) ×2 IMPLANT
SPONGE LAP 18X18 X RAY DECT (DISPOSABLE) ×2 IMPLANT
SUT MNCRL AB 4-0 PS2 18 (SUTURE) ×2 IMPLANT
SUT SILK 2 0 SH (SUTURE) IMPLANT
SUT VIC AB 3-0 SH 18 (SUTURE) ×2 IMPLANT
SYR BULB 3OZ (MISCELLANEOUS) ×2 IMPLANT
SYR CONTROL 10ML LL (SYRINGE) ×2 IMPLANT
TOWEL OR 17X24 6PK STRL BLUE (TOWEL DISPOSABLE) ×2 IMPLANT
TOWEL OR 17X26 10 PK STRL BLUE (TOWEL DISPOSABLE) ×2 IMPLANT
TUBE CONNECTING 12X1/4 (SUCTIONS) ×2 IMPLANT
YANKAUER SUCT BULB TIP NO VENT (SUCTIONS) ×2 IMPLANT

## 2016-11-16 NOTE — H&P (Signed)
Natalie Swanson is an 81 y.o. female.   Chief Complaint: left breast mass HPI: The patient is a 81 year old female who has an area of atypical lobular hyperplasia in the 1 o clock position of the left breast  Past Medical History:  Diagnosis Date  . Abnormal liver function tests 04/03/2015  . Atopic dermatitis 01/11/2012  . Cataract 07-04-2006   b/l  . Chicken pox as a child  . DM type 2, goal A1c below 7 07/18/2012  . Flashers or floaters, right eye 9-12   floaters  . History of shingles   . History of tobacco abuse 01/11/2012  . Hx of adenomatous colonic polyps 11/07/2014  . Hyperglycemia 07/18/2012  . Hyperlipidemia   . Hypertension   . Measles as a child  . Medicare annual wellness visit, subsequent 04/17/2011   G4P1 s/p 1 svd, no abnormal papas, menarche at 61 menopause at 61, regular   . Mumps as a child  . Pain in joint, ankle and foot 04/27/2014   right  . Shingles 9-12  . Vitamin D deficiency 09/14/2014    Past Surgical History:  Procedure Laterality Date  . CATARACT EXTRACTION W/ INTRAOCULAR LENS  IMPLANT, BILATERAL Bilateral 07-04-2006  . CESAREAN SECTION  1981   X 1    Family History  Problem Relation Age of Onset  . Hyperlipidemia Mother   . Hypertension Mother   . Heart disease Mother        MI  . Heart attack Father   . Diabetes Father        type 2  . Cancer Father        type unknown  . Heart attack Brother   . Heart disease Brother 84       MI  . Hemophilia Son        x 2  . Hypertension Son   . Colon cancer Neg Hx   . Stomach cancer Neg Hx    Social History:  reports that she has quit smoking. Her smoking use included Cigarettes. She has a 12.20 pack-year smoking history. She has never used smokeless tobacco. She reports that she does not drink alcohol or use drugs.  Allergies:  Allergies  Allergen Reactions  . Sulfa Antibiotics Rash    Medications Prior to Admission  Medication Sig Dispense Refill  . Cholecalciferol (VITAMIN D3) 5000 UNITS  CAPS Take 5,000 Units by mouth 2 (two) times daily. MORNING & AFTERNOON    . Omega-3 Fatty Acids (FISH OIL) 1200 MG CAPS Take 1,200 mg by mouth daily.    . propranolol (INDERAL) 40 MG tablet TAKE 1 TABLET TWICE DAILY 180 tablet 2  . simvastatin (ZOCOR) 40 MG tablet TAKE 1 TABLET AT BEDTIME 90 tablet 2  . Vitamin D, Ergocalciferol, (DRISDOL) 50000 units CAPS capsule TAKE 1 CAPSULE EVERY 7 (SEVEN) DAYS. (Patient taking differently: TAKE 1 CAPSULE EVERY SUNDAY MORNING.) 12 capsule 0  . alendronate (FOSAMAX) 70 MG tablet Take 1 tablet (70 mg total) by mouth once a week. Take with a full glass of water on an empty stomach. 4 tablet 4  . aspirin EC 81 MG tablet Take 81 mg by mouth daily as needed (for headache relief.).    Marland Kitchen calamine lotion Apply 1 application topically 4 (four) times daily as needed (for itching/irritated skin.).    Marland Kitchen calcium carbonate (OSCAL) 1500 (600 Ca) MG TABS tablet Take 600 mg by mouth See admin instructions. 1 TABLET IN THE AFTERNOON 3-4 TIMES A WEEK.  No results found for this or any previous visit (from the past 48 hour(s)). Mm Lt Radioactive Seed Loc Mammo Guide  Result Date: 11/14/2016 CLINICAL DATA:  Biopsy proven atypical lobular hyperplasia in the left breast. Surgical excision recommended. EXAM: MAMMOGRAPHIC GUIDED RADIOACTIVE SEED LOCALIZATION OF THE LEFT BREAST COMPARISON:  Previous exam(s). FINDINGS: Patient presents for radioactive seed localization prior to surgery. I met with the patient and we discussed the procedure of seed localization including benefits and alternatives. We discussed the high likelihood of a successful procedure. We discussed the risks of the procedure including infection, bleeding, tissue injury and further surgery. We discussed the low dose of radioactivity involved in the procedure. Informed, written consent was given. The usual time-out protocol was performed immediately prior to the procedure. Using mammographic guidance, sterile  technique, 1% lidocaine and an I-125 radioactive seed, the ribbon shaped clip was localized using a lateral to medial approach. The follow-up mammogram images confirm the seed in the expected location and were marked for Dr. Marlou Starks. Follow-up survey of the patient confirms presence of the radioactive seed. Order number of I-125 seed:  [id number]. Total activity:  0.315 millicuries  Reference Date: 10/28/2016 The patient tolerated the procedure well and was released from the [facility]. She was given instructions regarding seed removal. IMPRESSION: Radioactive seed localization left breast. No apparent complications. Electronically Signed   By: Lillia Mountain M.D.   On: 11/14/2016 13:14    Review of Systems  Constitutional: Negative.   HENT: Negative.   Eyes: Negative.   Respiratory: Negative.   Cardiovascular: Negative.   Gastrointestinal: Negative.   Genitourinary: Negative.   Musculoskeletal: Negative.   Skin: Negative.   Neurological: Negative.   Endo/Heme/Allergies: Negative.   Psychiatric/Behavioral: Negative.     Blood pressure 130/76, pulse 94, temperature 97.8 F (36.6 C), temperature source Oral, height '4\' 11"'  (1.499 m), weight 61.3 kg (135 lb 3 oz), SpO2 96 %. Physical Exam  Constitutional: She is oriented to person, place, and time. She appears well-developed and well-nourished. No distress.  HENT:  Head: Normocephalic and atraumatic.  Eyes: Conjunctivae and EOM are normal. Pupils are equal, round, and reactive to light.  Neck: Normal range of motion. Neck supple.  Cardiovascular: Normal rate, regular rhythm and normal heart sounds.   Respiratory: Effort normal and breath sounds normal. No respiratory distress.  GI: Soft. Bowel sounds are normal. There is no tenderness.  Musculoskeletal: Normal range of motion.  Neurological: She is alert and oriented to person, place, and time.  Skin: Skin is warm and dry.  Psychiatric: She has a normal mood and affect. Her behavior is  normal.     Assessment/Plan The patient has an area of alh in the left breast. Because of its abnormal appearance and because it is a high risk lesion I have recommended having this area removed. I have discussed with her the risks and benefits of the surgery as well as some of the technical aspects and she understands and wishes to proceed.  Merrie Roof, MD 11/16/2016, 8:19 AM

## 2016-11-16 NOTE — Transfer of Care (Signed)
Immediate Anesthesia Transfer of Care Note  Patient: Natalie Swanson  Procedure(s) Performed: Procedure(s): LEFT BREAST LUMPECTOMY WITH RADIOACTIVE SEED LOCALIZATION (Left)  Patient Location: PACU  Anesthesia Type:General  Level of Consciousness: patient cooperative and responds to stimulation  Airway & Oxygen Therapy: Patient Spontanous Breathing and Patient connected to nasal cannula oxygen  Post-op Assessment: Report given to RN and Post -op Vital signs reviewed and stable  Post vital signs: Reviewed and stable  Last Vitals:  Vitals:   11/16/16 0656  BP: 130/76  Pulse: 94  Temp: 36.6 C    Last Pain:  Vitals:   11/16/16 0656  TempSrc: Oral      Patients Stated Pain Goal: 5 (83/66/29 [id number])  Complications: No apparent anesthesia complications

## 2016-11-16 NOTE — Anesthesia Postprocedure Evaluation (Addendum)
Anesthesia Post Note  Patient: Natalie Swanson  Procedure(s) Performed: Procedure(s) (LRB): LEFT BREAST LUMPECTOMY WITH RADIOACTIVE SEED LOCALIZATION (Left)  Patient location during evaluation: PACU Anesthesia Type: General Level of consciousness: sedated Pain management: pain level controlled Vital Signs Assessment: post-procedure vital signs reviewed and stable Respiratory status: spontaneous breathing and respiratory function stable Cardiovascular status: stable Anesthetic complications: no       Last Vitals:  Vitals:   11/16/16 1000 11/16/16 1015  BP:  (!) (P) 145/67  Pulse: (!) 56 (P) 66  Resp: 15   Temp: 36.7 C     Last Pain:  Vitals:   11/16/16 1015  TempSrc:   PainSc: (P) 0-No pain                 Symphoni Helbling DANIEL

## 2016-11-16 NOTE — Interval H&P Note (Signed)
History and Physical Interval Note:  11/16/2016 8:23 AM  Ellenton Callas  has presented today for surgery, with the diagnosis of LEFT BREAST ALH  The various methods of treatment have been discussed with the patient and family. After consideration of risks, benefits and other options for treatment, the patient has consented to  Procedure(s): LEFT BREAST LUMPECTOMY WITH RADIOACTIVE SEED LOCALIZATION (Left) as a surgical intervention .  The patient's history has been reviewed, patient examined, no change in status, stable for surgery.  I have reviewed the patient's chart and labs.  Questions were answered to the patient's satisfaction.     TOTH III,PAUL S

## 2016-11-16 NOTE — Op Note (Signed)
11/16/2016  9:25 AM  PATIENT:  Natalie Swanson  81 y.o. female  PRE-OPERATIVE DIAGNOSIS:  LEFT BREAST ALH  POST-OPERATIVE DIAGNOSIS:  LEFT BREAST ALH  PROCEDURE:  Procedure(s): LEFT BREAST LUMPECTOMY WITH RADIOACTIVE SEED LOCALIZATION (Left)  SURGEON:  Surgeon(s) and Role:    * Jovita Kussmaul, MD - Primary  PHYSICIAN ASSISTANT:   ASSISTANTS: none   ANESTHESIA:   local and general  EBL:  Total I/O In: 1000 [I.V.:1000] Out: -   BLOOD ADMINISTERED:none  DRAINS: none   LOCAL MEDICATIONS USED:  MARCAINE     SPECIMEN:  Source of Specimen:  left breast tissue  DISPOSITION OF SPECIMEN:  PATHOLOGY  COUNTS:  YES  TOURNIQUET:  * No tourniquets in log *  DICTATION: .Dragon Dictation   After informed consent was obtained the patient was brought to the operating room and placed in the supine position on the operating table. After adequate induction of general anesthesia the patient's left breast was prepped with ChloraPrep, allowed to dry, and draped in usual sterile manner. An appropriate timeout was performed. Previously an I-125 seed was placed in the upper outer quadrant of the left breast to mark an area of atypical lobular hyperplasia. The neoprobe was sent to I-125 in the area of radioactivity was readily identified. The area surrounding this was infiltrated with quarter percent Marcaine. A curvilinear incision was made at the lateral edge of the breast near the axilla with a 15 blade knife. The incision was carried through the skin and subcutaneous tissue sharply with electrocautery. The dissection was then carried towards the radioactive seed using the neoprobe to direct the dissection. Once I approach the radioactive seed and then removed a circular portion of breast tissue sharply around the radioactive seed while checking the area radioactivity frequently. This dissection was carried down to the chest wall. Once the specimen was removed it was oriented with the appropriate  paint colors. A specimen radiograph was obtained that showed the clip in seed to be within the specimen. The seed appear to be at the medial edge of the specimen. An additional medial margin was taken sharply with electrocautery and marked appropriately. This was also sent to pathology for further evaluation. Hemostasis was achieved using the Bovie electrocautery. The wound was then irrigated with saline and infiltrated with quarter percent Marcaine. The deep layer of the wound was then closed with interrupted 3-0 Vicryl stitches. The skin was then closed with interrupted 4-0 Monocryl subcuticular stitches. Dermabond dressings were applied. The patient tolerated the procedure well. At the end of the case all needle sponge and is counts were correct. The patient was then awakened and taken to recovery in stable condition.  PLAN OF CARE: Discharge to home after PACU  PATIENT DISPOSITION:  PACU - hemodynamically stable.   Delay start of Pharmacological VTE agent (>24hrs) due to surgical blood loss or risk of bleeding: not applicable

## 2016-11-17 ENCOUNTER — Encounter (HOSPITAL_COMMUNITY): Payer: Self-pay | Admitting: General Surgery

## 2016-12-03 NOTE — Addendum Note (Signed)
Addendum  created 12/03/16 1012 by Dhani Imel, MD   Sign clinical note    

## 2016-12-08 ENCOUNTER — Ambulatory Visit (INDEPENDENT_AMBULATORY_CARE_PROVIDER_SITE_OTHER): Payer: Medicare HMO | Admitting: Family Medicine

## 2016-12-08 ENCOUNTER — Encounter: Payer: Self-pay | Admitting: Family Medicine

## 2016-12-08 VITALS — BP 102/48 | HR 66 | Temp 98.6°F | Ht 59.0 in | Wt 136.6 lb

## 2016-12-08 DIAGNOSIS — Z0000 Encounter for general adult medical examination without abnormal findings: Secondary | ICD-10-CM

## 2016-12-08 DIAGNOSIS — E785 Hyperlipidemia, unspecified: Secondary | ICD-10-CM | POA: Diagnosis not present

## 2016-12-08 DIAGNOSIS — E559 Vitamin D deficiency, unspecified: Secondary | ICD-10-CM

## 2016-12-08 DIAGNOSIS — E118 Type 2 diabetes mellitus with unspecified complications: Secondary | ICD-10-CM

## 2016-12-08 DIAGNOSIS — I10 Essential (primary) hypertension: Secondary | ICD-10-CM | POA: Diagnosis not present

## 2016-12-08 DIAGNOSIS — E119 Type 2 diabetes mellitus without complications: Secondary | ICD-10-CM

## 2016-12-08 LAB — VITAMIN D 25 HYDROXY (VIT D DEFICIENCY, FRACTURES): VITD: 68.62 ng/mL (ref 30.00–100.00)

## 2016-12-08 LAB — CBC
HCT: 41.3 % (ref 36.0–46.0)
Hemoglobin: 13.7 g/dL (ref 12.0–15.0)
MCHC: 33 g/dL (ref 30.0–36.0)
MCV: 93.9 fl (ref 78.0–100.0)
Platelets: 218 10*3/uL (ref 150.0–400.0)
RBC: 4.4 Mil/uL (ref 3.87–5.11)
RDW: 14.3 % (ref 11.5–15.5)
WBC: 7.2 10*3/uL (ref 4.0–10.5)

## 2016-12-08 LAB — LIPID PANEL
Cholesterol: 149 mg/dL (ref 0–200)
HDL: 60.2 mg/dL (ref >39.00)
LDL Cholesterol: 63 mg/dL (ref 0–99)
NonHDL: 88.75
Total CHOL/HDL Ratio: 2
Triglycerides: 131 mg/dL (ref 0.0–149.0)
VLDL: 26.2 mg/dL (ref 0.0–40.0)

## 2016-12-08 LAB — COMPREHENSIVE METABOLIC PANEL
ALT: 17 U/L (ref 0–35)
AST: 23 U/L (ref 0–37)
Albumin: 4.3 g/dL (ref 3.5–5.2)
Alkaline Phosphatase: 69 U/L (ref 39–117)
BUN: 10 mg/dL (ref 6–23)
CO2: 29 mEq/L (ref 19–32)
Calcium: 9.9 mg/dL (ref 8.4–10.5)
Chloride: 105 mEq/L (ref 96–112)
Creatinine, Ser: 0.79 mg/dL (ref 0.40–1.20)
GFR: 74.65 mL/min (ref >60.00)
Glucose, Bld: 104 mg/dL — ABNORMAL HIGH (ref 70–99)
Potassium: 4.1 mEq/L (ref 3.5–5.1)
Sodium: 139 mEq/L (ref 135–145)
Total Bilirubin: 0.4 mg/dL (ref 0.2–1.2)
Total Protein: 7.3 g/dL (ref 6.0–8.3)

## 2016-12-08 LAB — TSH: TSH: 1.62 u[IU]/mL (ref 0.35–4.50)

## 2016-12-08 NOTE — Progress Notes (Signed)
Patient ID: Natalie Swanson, female   DOB: 03/12/1936, 81 y.o.   MRN: [id number]   Subjective:  I acted as a Education administrator for Penni Homans, [place], [place]   Patient ID: Natalie Swanson, female    DOB: October 05, 1935, 81 y.o.   MRN: [id number]  Chief Complaint  Patient presents with  . Annual Exam  . Diabetes  . Hypertension    Diabetes  She presents for her follow-up diabetic visit. She has type 2 diabetes mellitus. Pertinent negatives for hypoglycemia include no headaches. Pertinent negatives for diabetes include no blurred vision and no chest pain. Symptoms are stable. Risk factors for coronary artery disease include diabetes mellitus and hypertension. There is no change in her home blood glucose trend.  Hypertension  This is a chronic problem. The problem is controlled. Pertinent negatives include no blurred vision, chest pain, headaches, malaise/fatigue, palpitations or shortness of breath. Risk factors for coronary artery disease include diabetes mellitus.    Patient is in today for an annual examination. Patient has a Hx of Type II Diabetes, HTN, hyperlipidemia, Hx of shingles, Hx of polynephritis, Vitamin D deficiency. Patient has no acute concerns noted at this time. No polyruia or polydipsia. No trouble with ADLs at home. Denies CP/palp/SOB/HA/congestion/fevers/GI or GU c/o. Taking meds as prescribed  Patient Care Team: Mosie Lukes, MD as PCP - General (Family Medicine)   Past Medical History:  Diagnosis Date  . Abnormal liver function tests 04/03/2015  . Atopic dermatitis 01/11/2012  . Cataract 07-04-2006   b/l  . Chicken pox as a child  . DM type 2, goal A1c below 7 07/18/2012  . Flashers or floaters, right eye 9-12   floaters  . History of shingles   . History of tobacco abuse 01/11/2012  . Hx of adenomatous colonic polyps 11/07/2014  . Hyperglycemia 07/18/2012  . Hyperlipidemia   . Hypertension   . Measles as a child  . Medicare annual wellness visit, subsequent 04/17/2011    G4P1 s/p 1 svd, no abnormal papas, menarche at 89 menopause at 37, regular   . Mumps as a child  . Pain in joint, ankle and foot 04/27/2014   right  . Shingles 9-12  . Vitamin D deficiency 09/14/2014    Past Surgical History:  Procedure Laterality Date  . BREAST LUMPECTOMY WITH RADIOACTIVE SEED LOCALIZATION Left 11/16/2016   Procedure: LEFT BREAST LUMPECTOMY WITH RADIOACTIVE SEED LOCALIZATION;  Surgeon: Jovita Kussmaul, MD;  Location: [facility];  Service: General;  Laterality: Left;  . CATARACT EXTRACTION W/ INTRAOCULAR LENS  IMPLANT, BILATERAL Bilateral 07-04-2006  . CESAREAN SECTION  1981   X 1    Family History  Problem Relation Age of Onset  . Hyperlipidemia Mother   . Hypertension Mother   . Heart disease Mother        MI  . Heart attack Father   . Diabetes Father        type 2  . Cancer Father        type unknown  . Heart attack Brother   . Heart disease Brother 81       MI  . Hemophilia Son        x 2  . Hypertension Son   . Colon cancer Neg Hx   . Stomach cancer Neg Hx     Social History   Social History  . Marital status: Widowed    Spouse name: N/A  . Number of children: 4  . Years of education:  N/A   Occupational History  . retired    Social History Main Topics  . Smoking status: Former Smoker    Packs/day: 0.20    Years: 61.00    Types: Cigarettes  . Smokeless tobacco: Never Used     Comment: uses e cigs ; quit smoking cigarettes 2013.  Marland Kitchen Alcohol use No  . Drug use: No  . Sexual activity: No     Comment: no dietary restrictions. lives with self.    Other Topics Concern  . Not on file   Social History Narrative  . No narrative on file    Outpatient Medications Prior to Visit  Medication Sig Dispense Refill  . alendronate (FOSAMAX) 70 MG tablet Take 1 tablet (70 mg total) by mouth once a week. Take with a full glass of water on an empty stomach. 4 tablet 4  . aspirin EC 81 MG tablet Take 81 mg by mouth daily as needed (for headache relief.).     Marland Kitchen calamine lotion Apply 1 application topically 4 (four) times daily as needed (for itching/irritated skin.).    Marland Kitchen calcium carbonate (OSCAL) 1500 (600 Ca) MG TABS tablet Take 600 mg by mouth See admin instructions. 1 TABLET IN THE AFTERNOON 3-4 TIMES A WEEK.    . Cholecalciferol (VITAMIN D3) 5000 UNITS CAPS Take 5,000 Units by mouth 2 (two) times daily. MORNING & AFTERNOON    . HYDROcodone-acetaminophen (NORCO/VICODIN) 5-325 MG tablet Take 1-2 tablets by mouth every 4 (four) hours as needed for moderate pain or severe pain. 15 tablet 0  . Omega-3 Fatty Acids (FISH OIL) 1200 MG CAPS Take 1,200 mg by mouth daily.    . propranolol (INDERAL) 40 MG tablet TAKE 1 TABLET TWICE DAILY 180 tablet 2  . simvastatin (ZOCOR) 40 MG tablet TAKE 1 TABLET AT BEDTIME 90 tablet 2  . Vitamin D, Ergocalciferol, (DRISDOL) 50000 units CAPS capsule TAKE 1 CAPSULE EVERY 7 (SEVEN) DAYS. (Patient taking differently: TAKE 1 CAPSULE EVERY SUNDAY MORNING.) 12 capsule 0   No facility-administered medications prior to visit.     Allergies  Allergen Reactions  . Sulfa Antibiotics Rash    Review of Systems  Constitutional: Negative for fever and malaise/fatigue.  HENT: Negative for congestion.   Eyes: Negative for blurred vision.  Respiratory: Negative for cough and shortness of breath.   Cardiovascular: Negative for chest pain, palpitations and leg swelling.  Gastrointestinal: Negative for vomiting.  Musculoskeletal: Negative for back pain.  Skin: Negative for rash.  Neurological: Negative for loss of consciousness and headaches.       Objective:    Physical Exam  Constitutional: She is oriented to person, place, and time. She appears well-developed and well-nourished. No distress.  HENT:  Head: Normocephalic and atraumatic.  Eyes: Conjunctivae are normal.  Neck: Normal range of motion. No thyromegaly present.  Cardiovascular: Normal rate and regular rhythm.   Pulmonary/Chest: Effort normal and breath sounds  normal. She has no wheezes.  Abdominal: Soft. Bowel sounds are normal. There is no tenderness.  Musculoskeletal: She exhibits no edema or deformity.  Neurological: She is alert and oriented to person, place, and time.  Skin: Skin is warm and dry. She is not diaphoretic.  Psychiatric: She has a normal mood and affect.    BP (!) 102/48 (BP Location: Left Arm, Patient Position: Sitting, Cuff Size: Normal)   Pulse 66   Temp 98.6 F (37 C) (Oral)   Ht 4\' 11"  (1.499 m)   Wt 136 lb 9.6 oz (62  kg)   SpO2 99% Comment: RA  BMI 27.59 kg/m  Wt Readings from Last 3 Encounters:  12/08/16 136 lb 9.6 oz (62 kg)  11/16/16 135 lb 3 oz (61.3 kg)  11/09/16 135 lb 3.2 oz (61.3 kg)   BP Readings from Last 3 Encounters:  12/08/16 (!) 102/48  11/16/16 (!) 145/67  11/09/16 (!) 129/52     Immunization History  Administered Date(s) Administered  . Influenza Split 04/06/2011, 07/18/2012  . Influenza,inj,Quad PF,36+ Mos 03/13/2013, 04/18/2014, 03/27/2015  . Pneumococcal Conjugate-13 07/26/2013  . Pneumococcal Polysaccharide-23 06/25/2015  . Tdap 04/06/2011    Health Maintenance  Topic Date Due  . INFLUENZA VACCINE  07/28/2017 (Originally 02/01/2017)  . OPHTHALMOLOGY EXAM  02/08/2017  . HEMOGLOBIN A1C  05/12/2017  . FOOT EXAM  07/28/2017  . URINE MICROALBUMIN  07/28/2017  . TETANUS/TDAP  04/05/2021  . DEXA SCAN  Completed  . PNA vac Low Risk Adult  Completed    Lab Results  Component Value Date   WBC 7.2 12/08/2016   HGB 13.7 12/08/2016   HCT 41.3 12/08/2016   PLT 218.0 12/08/2016   GLUCOSE 104 (H) 12/08/2016   CHOL 149 12/08/2016   TRIG 131.0 12/08/2016   HDL 60.20 12/08/2016   LDLDIRECT 146.8 07/13/2012   LDLCALC 63 12/08/2016   ALT 17 12/08/2016   AST 23 12/08/2016   NA 139 12/08/2016   K 4.1 12/08/2016   CL 105 12/08/2016   CREATININE 0.79 12/08/2016   BUN 10 12/08/2016   CO2 29 12/08/2016   TSH 1.62 12/08/2016   HGBA1C 6.5 (H) 11/09/2016   MICROALBUR <0.7 07/28/2016     Lab Results  Component Value Date   TSH 1.62 12/08/2016   Lab Results  Component Value Date   WBC 7.2 12/08/2016   HGB 13.7 12/08/2016   HCT 41.3 12/08/2016   MCV 93.9 12/08/2016   PLT 218.0 12/08/2016   Lab Results  Component Value Date   NA 139 12/08/2016   K 4.1 12/08/2016   CO2 29 12/08/2016   GLUCOSE 104 (H) 12/08/2016   BUN 10 12/08/2016   CREATININE 0.79 12/08/2016   BILITOT 0.4 12/08/2016   ALKPHOS 69 12/08/2016   AST 23 12/08/2016   ALT 17 12/08/2016   PROT 7.3 12/08/2016   ALBUMIN 4.3 12/08/2016   CALCIUM 9.9 12/08/2016   ANIONGAP 9 11/09/2016   GFR 74.65 12/08/2016   Lab Results  Component Value Date   CHOL 149 12/08/2016   Lab Results  Component Value Date   HDL 60.20 12/08/2016   Lab Results  Component Value Date   LDLCALC 63 12/08/2016   Lab Results  Component Value Date   TRIG 131.0 12/08/2016   Lab Results  Component Value Date   CHOLHDL 2 12/08/2016   Lab Results  Component Value Date   HGBA1C 6.5 (H) 11/09/2016         Assessment & Plan:   Problem List Items Addressed This Visit    Hypertension    Well controlled, no changes to meds. Encouraged heart healthy diet such as the DASH diet and exercise as tolerated.       Relevant Orders   TSH (Completed)   CBC (Completed)   Comprehensive metabolic panel (Completed)   CBC   Comprehensive metabolic panel   TSH   Hyperlipidemia    Encouraged heart healthy diet, increase exercise, avoid trans fats, consider a krill oil cap daily      Relevant Orders   Lipid panel (Completed)  Lipid panel   Preventative health care - Primary    Patient encouraged to maintain heart healthy diet, regular exercise, adequate sleep. Consider daily probiotics. Take medications as prescribed      Type 2 diabetes mellitus with hemoglobin A1c goal of less than 7.0% (HCC)    hgba1c acceptable, minimize simple carbs. Increase exercise as tolerated. Continue current meds      Relevant Orders    Hemoglobin A1c   Vitamin D deficiency    Taking daily vitamin D 5000 IU daily, check level today      Relevant Orders   VITAMIN D 25 Hydroxy (Vit-D Deficiency, Fractures) (Completed)    Other Visit Diagnoses    Type 2 diabetes mellitus with complication, unspecified whether long term insulin use ([place])          I am having Ms. Adderly maintain her Vitamin D3, simvastatin, propranolol, alendronate, Vitamin D (Ergocalciferol), Fish Oil, calcium carbonate, aspirin EC, calamine, and HYDROcodone-acetaminophen.  No orders of the defined types were placed in this encounter.   CMA served as Education administrator during this visit. History, Physical and Plan performed by medical provider. Documentation and orders reviewed and attested to.  Penni Homans, MD

## 2016-12-08 NOTE — Patient Instructions (Addendum)
Shingrix is the new shingles shot, check with pharmacy to confirm insurance will pay   Preventive Care 79 Years and Older, Female Preventive care refers to lifestyle choices and visits with your health care provider that can promote health and wellness. What does preventive care include?  A yearly physical exam. This is also called an annual well check.  Dental exams once or twice a year.  Routine eye exams. Ask your health care provider how often you should have your eyes checked.  Personal lifestyle choices, including: ? Daily care of your teeth and gums. ? Regular physical activity. ? Eating a healthy diet. ? Avoiding tobacco and drug use. ? Limiting alcohol use. ? Practicing safe sex. ? Taking low-dose aspirin every day. ? Taking vitamin and mineral supplements as recommended by your health care provider. What happens during an annual well check? The services and screenings done by your health care provider during your annual well check will depend on your age, overall health, lifestyle risk factors, and family history of disease. Counseling Your health care provider may ask you questions about your:  Alcohol use.  Tobacco use.  Drug use.  Emotional well-being.  Home and relationship well-being.  Sexual activity.  Eating habits.  History of falls.  Memory and ability to understand (cognition).  Work and work Statistician.  Reproductive health.  Screening You may have the following tests or measurements:  Height, weight, and BMI.  Blood pressure.  Lipid and cholesterol levels. These may be checked every 5 years, or more frequently if you are over 79 years old.  Skin check.  Lung cancer screening. You may have this screening every year starting at age 66 if you have a 30-pack-year history of smoking and currently smoke or have quit within the past 15 years.  Fecal occult blood test (FOBT) of the stool. You may have this test every year starting at age  79.  Flexible sigmoidoscopy or colonoscopy. You may have a sigmoidoscopy every 5 years or a colonoscopy every 10 years starting at age 11.  Hepatitis C blood test.  Hepatitis B blood test.  Sexually transmitted disease (STD) testing.  Diabetes screening. This is done by checking your blood sugar (glucose) after you have not eaten for a while (fasting). You may have this done every 1-3 years.  Bone density scan. This is done to screen for osteoporosis. You may have this done starting at age 31.  Mammogram. This may be done every 1-2 years. Talk to your health care provider about how often you should have regular mammograms.  Talk with your health care provider about your test results, treatment options, and if necessary, the need for more tests. Vaccines Your health care provider may recommend certain vaccines, such as:  Influenza vaccine. This is recommended every year.  Tetanus, diphtheria, and acellular pertussis (Tdap, Td) vaccine. You may need a Td booster every 10 years.  Varicella vaccine. You may need this if you have not been vaccinated.  Zoster vaccine. You may need this after age 26.  Measles, mumps, and rubella (MMR) vaccine. You may need at least one dose of MMR if you were born in 1957 or later. You may also need a second dose.  Pneumococcal 13-valent conjugate (PCV13) vaccine. One dose is recommended after age 88.  Pneumococcal polysaccharide (PPSV23) vaccine. One dose is recommended after age 43.  Meningococcal vaccine. You may need this if you have certain conditions.  Hepatitis A vaccine. You may need this if you have certain  conditions or if you travel or work in places where you may be exposed to hepatitis A.  Hepatitis B vaccine. You may need this if you have certain conditions or if you travel or work in places where you may be exposed to hepatitis B.  Haemophilus influenzae type b (Hib) vaccine. You may need this if you have certain conditions.  Talk to  your health care provider about which screenings and vaccines you need and how often you need them. This information is not intended to replace advice given to you by your health care provider. Make sure you discuss any questions you have with your health care provider. Document Released: 07/17/2015 Document Revised: 03/09/2016 Document Reviewed: 04/21/2015 Elsevier Interactive Patient Education  2017 [employer].

## 2016-12-08 NOTE — Progress Notes (Signed)
Pre visit review using our clinic review tool, if applicable. No additional management support is needed unless otherwise documented below in the visit note. 

## 2016-12-08 NOTE — Assessment & Plan Note (Signed)
Encouraged heart healthy diet, increase exercise, avoid trans fats, consider a krill oil cap daily 

## 2016-12-08 NOTE — Assessment & Plan Note (Signed)
Taking daily vitamin D 5000 IU daily, check level today

## 2016-12-08 NOTE — Assessment & Plan Note (Signed)
hgba1c acceptable, minimize simple carbs. Increase exercise as tolerated. Continue current meds 

## 2016-12-08 NOTE — Assessment & Plan Note (Signed)
Well controlled, no changes to meds. Encouraged heart healthy diet such as the DASH diet and exercise as tolerated.  °

## 2016-12-11 NOTE — Assessment & Plan Note (Signed)
Patient encouraged to maintain heart healthy diet, regular exercise, adequate sleep. Consider daily probiotics. Take medications as prescribed 

## 2017-02-20 ENCOUNTER — Other Ambulatory Visit: Payer: Self-pay | Admitting: Family Medicine

## 2017-06-05 ENCOUNTER — Other Ambulatory Visit (INDEPENDENT_AMBULATORY_CARE_PROVIDER_SITE_OTHER): Payer: Medicare HMO

## 2017-06-05 DIAGNOSIS — I10 Essential (primary) hypertension: Secondary | ICD-10-CM

## 2017-06-05 DIAGNOSIS — E785 Hyperlipidemia, unspecified: Secondary | ICD-10-CM | POA: Diagnosis not present

## 2017-06-05 DIAGNOSIS — E119 Type 2 diabetes mellitus without complications: Secondary | ICD-10-CM | POA: Diagnosis not present

## 2017-06-05 LAB — CBC
HCT: 42.5 % (ref 36.0–46.0)
Hemoglobin: 14 g/dL (ref 12.0–15.0)
MCHC: 32.9 g/dL (ref 30.0–36.0)
MCV: 96.9 fl (ref 78.0–100.0)
Platelets: 190 10*3/uL (ref 150.0–400.0)
RBC: 4.38 Mil/uL (ref 3.87–5.11)
RDW: 13.8 % (ref 11.5–15.5)
WBC: 7.7 10*3/uL (ref 4.0–10.5)

## 2017-06-05 LAB — LIPID PANEL
Cholesterol: 153 mg/dL (ref 0–200)
HDL: 59.3 mg/dL (ref >39.00)
NonHDL: 94.19
Total CHOL/HDL Ratio: 3
Triglycerides: 215 mg/dL — ABNORMAL HIGH (ref 0.0–149.0)
VLDL: 43 mg/dL — ABNORMAL HIGH (ref 0.0–40.0)

## 2017-06-05 LAB — COMPREHENSIVE METABOLIC PANEL
ALT: 14 U/L (ref 0–35)
AST: 23 U/L (ref 0–37)
Albumin: 4.2 g/dL (ref 3.5–5.2)
Alkaline Phosphatase: 66 U/L (ref 39–117)
BUN: 12 mg/dL (ref 6–23)
CO2: 30 mEq/L (ref 19–32)
Calcium: 10.2 mg/dL (ref 8.4–10.5)
Chloride: 105 mEq/L (ref 96–112)
Creatinine, Ser: 0.89 mg/dL (ref 0.40–1.20)
GFR: 64.98 mL/min (ref >60.00)
Glucose, Bld: 146 mg/dL — ABNORMAL HIGH (ref 70–99)
Potassium: 4.9 mEq/L (ref 3.5–5.1)
Sodium: 141 mEq/L (ref 135–145)
Total Bilirubin: 0.3 mg/dL (ref 0.2–1.2)
Total Protein: 6.9 g/dL (ref 6.0–8.3)

## 2017-06-05 LAB — TSH: TSH: 2.56 u[IU]/mL (ref 0.35–4.50)

## 2017-06-05 LAB — HEMOGLOBIN A1C: Hgb A1c MFr Bld: 6.5 % (ref 4.6–6.5)

## 2017-06-05 LAB — LDL CHOLESTEROL, DIRECT: Direct LDL: 68 mg/dL

## 2017-06-08 ENCOUNTER — Ambulatory Visit: Payer: Medicare HMO | Admitting: Family Medicine

## 2017-06-08 ENCOUNTER — Encounter: Payer: Self-pay | Admitting: Family Medicine

## 2017-06-08 VITALS — BP 123/55 | HR 67 | Temp 98.2°F | Resp 16 | Ht 59.06 in | Wt 138.6 lb

## 2017-06-08 DIAGNOSIS — E559 Vitamin D deficiency, unspecified: Secondary | ICD-10-CM

## 2017-06-08 DIAGNOSIS — Z23 Encounter for immunization: Secondary | ICD-10-CM | POA: Diagnosis not present

## 2017-06-08 DIAGNOSIS — N952 Postmenopausal atrophic vaginitis: Secondary | ICD-10-CM | POA: Diagnosis not present

## 2017-06-08 DIAGNOSIS — E785 Hyperlipidemia, unspecified: Secondary | ICD-10-CM | POA: Diagnosis not present

## 2017-06-08 DIAGNOSIS — E119 Type 2 diabetes mellitus without complications: Secondary | ICD-10-CM | POA: Diagnosis not present

## 2017-06-08 DIAGNOSIS — I10 Essential (primary) hypertension: Secondary | ICD-10-CM

## 2017-06-08 MED ORDER — ESTROGENS, CONJUGATED 0.625 MG/GM VA CREA: 1.0000 | TOPICAL_CREAM | Freq: Every day | VAGINAL | 1 refills | Status: DC

## 2017-06-08 NOTE — Patient Instructions (Signed)
[employer], Insurance account manager Atrophic Vaginitis Atrophic vaginitis is when the tissues that line the vagina become dry and thin. This is caused by a drop in estrogen. Estrogen helps:  To keep the vagina moist.  To make a clear fluid that helps: ? To lubricate the vagina for sex. ? To protect the vagina from infection.  If the lining of the vagina is dry and thin, it may:  Make sex painful. It may also cause bleeding.  Cause a feeling of: ? Burning. ? Irritation. ? Itchiness.  Make an exam of your vagina painful. It may also cause bleeding.  Make you lose interest in sex.  Cause a burning feeling when you pee.  Make your vaginal fluid (discharge) brown or yellow.  For some women, there are no symptoms. This condition is most common in women who do not get their regular menstrual periods anymore (menopause). This often starts when a woman is 55-58 years old. Follow these instructions at home:  Take medicines only as told by your doctor. Do not use any herbal or alternative medicines unless your doctor says it is okay.  Use over-the-counter products for dryness only as told by your doctor. These include: ? Creams. ? Lubricants. ? Moisturizers.  Do not douche.  Do not use products that can make your vagina dry. These include: ? Scented feminine sprays. ? Scented tampons. ? Scented soaps.  If it hurts to have sex, tell your sexual partner. Contact a doctor if:  Your discharge looks different than normal.  Your vagina has an unusual smell.  You have new symptoms.  Your symptoms do not get better with treatment.  Your symptoms get worse. This information is not intended to replace advice given to you by your health care provider. Make sure you discuss any questions you have with your health care provider. Document Released: 12/07/2007 Document Revised: 11/26/2015 Document Reviewed: 06/11/2014 Elsevier Interactive Patient Education  [employer].

## 2017-06-08 NOTE — Progress Notes (Signed)
Subjective:  I acted as a Education administrator for [employer]. Yancey Flemings, [place]   Patient ID: Brewerton Callas, female    DOB: 1935-07-08, 81 y.o.   MRN: [id number]  Chief Complaint  Patient presents with  . Follow-up    HPI  Patient is in today for follow up she is doing well.  She is marrying school sweetheart soon and is excited but is apprehensive about her atrophic vaginitis and dyspareunia.  She denies any other recent febrile illness or hospitalization.  No other acute concerns. Denies CP/palp/SOB/HA/congestion/fevers/GI or GU c/o. Taking meds as prescribed  Patient Care Team: Mosie Lukes, MD as PCP - General (Family Medicine)   Past Medical History:  Diagnosis Date  . Abnormal liver function tests 04/03/2015  . Atopic dermatitis 01/11/2012  . Cataract 07-04-2006   b/l  . Chicken pox as a child  . DM type 2, goal A1c below 7 07/18/2012  . Flashers or floaters, right eye 9-12   floaters  . History of shingles   . History of tobacco abuse 01/11/2012  . Hx of adenomatous colonic polyps 11/07/2014  . Hyperglycemia 07/18/2012  . Hyperlipidemia   . Hypertension   . Measles as a child  . Medicare annual wellness visit, subsequent 04/17/2011   G4P1 s/p 1 svd, no abnormal papas, menarche at 89 menopause at 84, regular   . Mumps as a child  . Pain in joint, ankle and foot 04/27/2014   right  . Shingles 9-12  . Vitamin D deficiency 09/14/2014    Past Surgical History:  Procedure Laterality Date  . BREAST LUMPECTOMY WITH RADIOACTIVE SEED LOCALIZATION Left 11/16/2016   Procedure: LEFT BREAST LUMPECTOMY WITH RADIOACTIVE SEED LOCALIZATION;  Surgeon: Jovita Kussmaul, MD;  Location: [facility];  Service: General;  Laterality: Left;  . CATARACT EXTRACTION W/ INTRAOCULAR LENS  IMPLANT, BILATERAL Bilateral 07-04-2006  . CESAREAN SECTION  1981   X 1    Family History  Problem Relation Age of Onset  . Hyperlipidemia Mother   . Hypertension Mother   . Heart disease Mother        MI  . Heart attack Father     . Diabetes Father        type 2  . Cancer Father        type unknown  . Heart attack Brother   . Heart disease Brother 36       MI  . Hemophilia Son        x 2  . Hypertension Son   . Colon cancer Neg Hx   . Stomach cancer Neg Hx     Social History   Socioeconomic History  . Marital status: Widowed    Spouse name: Not on file  . Number of children: 4  . Years of education: Not on file  . Highest education level: Not on file  Social Needs  . Financial resource strain: Not on file  . Food insecurity - worry: Not on file  . Food insecurity - inability: Not on file  . Transportation needs - medical: Not on file  . Transportation needs - non-medical: Not on file  Occupational History  . Occupation: retired  Tobacco Use  . Smoking status: Former Smoker    Packs/day: 0.20    Years: 61.00    Pack years: 12.20    Types: Cigarettes  . Smokeless tobacco: Never Used  . Tobacco comment: uses e cigs ; quit smoking cigarettes 2013.  Substance and Sexual Activity  .  Alcohol use: No    Alcohol/week: 0.0 oz  . Drug use: No  . Sexual activity: No    Comment: no dietary restrictions. lives with self.   Other Topics Concern  . Not on file  Social History Narrative  . Not on file    Outpatient Medications Prior to Visit  Medication Sig Dispense Refill  . Omega-3 Fatty Acids (FISH OIL) 1200 MG CAPS Take 1,200 mg by mouth daily.    . propranolol (INDERAL) 40 MG tablet TAKE 1 TABLET TWICE DAILY 180 tablet 2  . simvastatin (ZOCOR) 40 MG tablet TAKE 1 TABLET AT BEDTIME 90 tablet 2  . Vitamin D, Ergocalciferol, (DRISDOL) 50000 units CAPS capsule TAKE 1 CAPSULE EVERY 7 (SEVEN) DAYS. (Patient taking differently: TAKE 1 CAPSULE EVERY SUNDAY MORNING.) 12 capsule 0  . alendronate (FOSAMAX) 70 MG tablet Take 1 tablet (70 mg total) by mouth once a week. Take with a full glass of water on an empty stomach. (Patient not taking: Reported on 06/08/2017) 4 tablet 4  . aspirin EC 81 MG tablet Take  81 mg by mouth daily as needed (for headache relief.).    Marland Kitchen calamine lotion Apply 1 application topically 4 (four) times daily as needed (for itching/irritated skin.).    Marland Kitchen calcium carbonate (OSCAL) 1500 (600 Ca) MG TABS tablet Take 600 mg by mouth See admin instructions. 1 TABLET IN THE AFTERNOON 3-4 TIMES A WEEK.    . Cholecalciferol (VITAMIN D3) 5000 UNITS CAPS Take 5,000 Units by mouth 2 (two) times daily. MORNING & AFTERNOON    . HYDROcodone-acetaminophen (NORCO/VICODIN) 5-325 MG tablet Take 1-2 tablets by mouth every 4 (four) hours as needed for moderate pain or severe pain. (Patient not taking: Reported on 06/08/2017) 15 tablet 0   No facility-administered medications prior to visit.     Allergies  Allergen Reactions  . Sulfa Antibiotics Rash    Review of Systems  Constitutional: Negative for fever and malaise/fatigue.  HENT: Negative for congestion.   Eyes: Negative for blurred vision.  Respiratory: Negative for shortness of breath.   Cardiovascular: Negative for chest pain, palpitations and leg swelling.  Gastrointestinal: Negative for abdominal pain, blood in stool and nausea.  Genitourinary: Negative for dysuria and frequency.  Musculoskeletal: Negative for falls.  Skin: Negative for rash.  Neurological: Negative for dizziness, loss of consciousness and headaches.  Endo/Heme/Allergies: Negative for environmental allergies.  Psychiatric/Behavioral: Negative for depression. The patient is not nervous/anxious.        Objective:    Physical Exam  Constitutional: She is oriented to person, place, and time. She appears well-developed and well-nourished. No distress.  HENT:  Head: Normocephalic and atraumatic.  Nose: Nose normal.  Eyes: Right eye exhibits no discharge. Left eye exhibits no discharge.  Neck: Normal range of motion. Neck supple.  Cardiovascular: Normal rate and regular rhythm.  No murmur heard. Pulmonary/Chest: Effort normal and breath sounds normal.   Abdominal: Soft. Bowel sounds are normal. There is no tenderness.  Musculoskeletal: She exhibits no edema.  Neurological: She is alert and oriented to person, place, and time.  Skin: Skin is warm and dry.  Psychiatric: She has a normal mood and affect.  Nursing note and vitals reviewed.   BP (!) 123/55 (BP Location: Left Arm, Patient Position: Sitting, Cuff Size: Normal)   Pulse 67   Temp 98.2 F (36.8 C) (Oral)   Resp 16   Ht 4' 11.06" (1.5 m)   Wt 138 lb 9.6 oz (62.9 kg)   SpO2  97%   BMI 27.94 kg/m  Wt Readings from Last 3 Encounters:  06/08/17 138 lb 9.6 oz (62.9 kg)  12/08/16 136 lb 9.6 oz (62 kg)  11/16/16 135 lb 3 oz (61.3 kg)   BP Readings from Last 3 Encounters:  06/08/17 (!) 123/55  12/08/16 (!) 102/48  11/16/16 (!) 145/67     Immunization History  Administered Date(s) Administered  . Influenza Split 04/06/2011, 07/18/2012  . Influenza, High Dose Seasonal PF 06/08/2017  . Influenza,inj,Quad PF,6+ Mos 03/13/2013, 04/18/2014, 03/27/2015  . Pneumococcal Conjugate-13 07/26/2013  . Pneumococcal Polysaccharide-23 06/25/2015  . Tdap 04/06/2011    Health Maintenance  Topic Date Due  . OPHTHALMOLOGY EXAM  02/08/2017  . FOOT EXAM  07/28/2017  . URINE MICROALBUMIN  07/28/2017  . HEMOGLOBIN A1C  12/04/2017  . TETANUS/TDAP  04/05/2021  . INFLUENZA VACCINE  Completed  . DEXA SCAN  Completed  . PNA vac Low Risk Adult  Completed    Lab Results  Component Value Date   WBC 7.7 06/05/2017   HGB 14.0 06/05/2017   HCT 42.5 06/05/2017   PLT 190.0 06/05/2017   GLUCOSE 146 (H) 06/05/2017   CHOL 153 06/05/2017   TRIG 215.0 (H) 06/05/2017   HDL 59.30 06/05/2017   LDLDIRECT 68.0 06/05/2017   LDLCALC 63 12/08/2016   ALT 14 06/05/2017   AST 23 06/05/2017   NA 141 06/05/2017   K 4.9 06/05/2017   CL 105 06/05/2017   CREATININE 0.89 06/05/2017   BUN 12 06/05/2017   CO2 30 06/05/2017   TSH 2.56 06/05/2017   HGBA1C 6.5 06/05/2017   MICROALBUR <0.7 07/28/2016    Lab  Results  Component Value Date   TSH 2.56 06/05/2017   Lab Results  Component Value Date   WBC 7.7 06/05/2017   HGB 14.0 06/05/2017   HCT 42.5 06/05/2017   MCV 96.9 06/05/2017   PLT 190.0 06/05/2017   Lab Results  Component Value Date   NA 141 06/05/2017   K 4.9 06/05/2017   CO2 30 06/05/2017   GLUCOSE 146 (H) 06/05/2017   BUN 12 06/05/2017   CREATININE 0.89 06/05/2017   BILITOT 0.3 06/05/2017   ALKPHOS 66 06/05/2017   AST 23 06/05/2017   ALT 14 06/05/2017   PROT 6.9 06/05/2017   ALBUMIN 4.2 06/05/2017   CALCIUM 10.2 06/05/2017   ANIONGAP 9 11/09/2016   GFR 64.98 06/05/2017   Lab Results  Component Value Date   CHOL 153 06/05/2017   Lab Results  Component Value Date   HDL 59.30 06/05/2017   Lab Results  Component Value Date   LDLCALC 63 12/08/2016   Lab Results  Component Value Date   TRIG 215.0 (H) 06/05/2017   Lab Results  Component Value Date   CHOLHDL 3 06/05/2017   Lab Results  Component Value Date   HGBA1C 6.5 06/05/2017         Assessment & Plan:   Problem List Items Addressed This Visit    Hypertension    Well controlled, no changes to meds. Encouraged heart healthy diet such as the DASH diet and exercise as tolerated.       Hyperlipidemia    Encouraged heart healthy diet, increase exercise, avoid trans fats, consider a krill oil cap daily      Type 2 diabetes mellitus with hemoglobin A1c goal of less than 7.0% (HCC)    hgba1c acceptable, minimize simple carbs. Increase exercise as tolerated. Continue current meds      Vitamin D deficiency  Maintan daily supplements      Atrophic vaginitis    She is getting married soon and is worried about intimacy. Will try Premarin Cream to use a small amount 2 x a week and notified of possible risks.       Other Visit Diagnoses    Needs flu shot    -  Primary   Relevant Orders   Flu vaccine HIGH DOSE PF (Fluzone High dose) (Completed)      I am having Tenna Child. Bultman start on  conjugated estrogens. I am also having her maintain her Vitamin D3, alendronate, Vitamin D (Ergocalciferol), Fish Oil, calcium carbonate, aspirin EC, calamine, HYDROcodone-acetaminophen, simvastatin, and propranolol.  Meds ordered this encounter  Medications  . conjugated estrogens (PREMARIN) vaginal cream    Sig: Place 1 Applicatorful vaginally daily.    Dispense:  42.5 g    Refill:  1    CMA served as scribe during this visit. History, Physical and Plan performed by medical provider. Documentation and orders reviewed and attested to.  Penni Homans, MD

## 2017-06-13 DIAGNOSIS — N952 Postmenopausal atrophic vaginitis: Secondary | ICD-10-CM | POA: Insufficient documentation

## 2017-06-13 NOTE — Assessment & Plan Note (Signed)
Encouraged heart healthy diet, increase exercise, avoid trans fats, consider a krill oil cap daily 

## 2017-06-13 NOTE — Assessment & Plan Note (Signed)
hgba1c acceptable, minimize simple carbs. Increase exercise as tolerated. Continue current meds 

## 2017-06-13 NOTE — Assessment & Plan Note (Signed)
She is getting married soon and is worried about intimacy. Will try Premarin Cream to use a small amount 2 x a week and notified of possible risks.

## 2017-06-13 NOTE — Assessment & Plan Note (Signed)
Maintan daily supplements

## 2017-06-13 NOTE — Assessment & Plan Note (Signed)
Well controlled, no changes to meds. Encouraged heart healthy diet such as the DASH diet and exercise as tolerated.  °

## 2017-09-26 ENCOUNTER — Other Ambulatory Visit: Payer: Self-pay | Admitting: Family Medicine

## 2017-12-07 ENCOUNTER — Ambulatory Visit (INDEPENDENT_AMBULATORY_CARE_PROVIDER_SITE_OTHER): Payer: Medicare HMO | Admitting: Family Medicine

## 2017-12-07 ENCOUNTER — Other Ambulatory Visit: Payer: Medicare HMO

## 2017-12-07 ENCOUNTER — Encounter: Payer: Self-pay | Admitting: Family Medicine

## 2017-12-07 DIAGNOSIS — E785 Hyperlipidemia, unspecified: Secondary | ICD-10-CM

## 2017-12-07 DIAGNOSIS — E119 Type 2 diabetes mellitus without complications: Secondary | ICD-10-CM

## 2017-12-07 DIAGNOSIS — E559 Vitamin D deficiency, unspecified: Secondary | ICD-10-CM

## 2017-12-07 DIAGNOSIS — I10 Essential (primary) hypertension: Secondary | ICD-10-CM

## 2017-12-07 LAB — COMPREHENSIVE METABOLIC PANEL
ALT: 11 U/L (ref 0–35)
AST: 18 U/L (ref 0–37)
Albumin: 4.3 g/dL (ref 3.5–5.2)
Alkaline Phosphatase: 65 U/L (ref 39–117)
BUN: 10 mg/dL (ref 6–23)
CO2: 28 mEq/L (ref 19–32)
Calcium: 9.6 mg/dL (ref 8.4–10.5)
Chloride: 103 mEq/L (ref 96–112)
Creatinine, Ser: 0.8 mg/dL (ref 0.40–1.20)
GFR: 73.39 mL/min (ref >60.00)
Glucose, Bld: 96 mg/dL (ref 70–99)
Potassium: 4.4 mEq/L (ref 3.5–5.1)
Sodium: 138 mEq/L (ref 135–145)
Total Bilirubin: 0.7 mg/dL (ref 0.2–1.2)
Total Protein: 6.6 g/dL (ref 6.0–8.3)

## 2017-12-07 LAB — CBC
HCT: 39.1 % (ref 36.0–46.0)
Hemoglobin: 12.9 g/dL (ref 12.0–15.0)
MCHC: 33.1 g/dL (ref 30.0–36.0)
MCV: 94.4 fl (ref 78.0–100.0)
Platelets: 187 10*3/uL (ref 150.0–400.0)
RBC: 4.14 Mil/uL (ref 3.87–5.11)
RDW: 13.9 % (ref 11.5–15.5)
WBC: 7 10*3/uL (ref 4.0–10.5)

## 2017-12-07 LAB — LIPID PANEL
Cholesterol: 131 mg/dL (ref 0–200)
HDL: 52.9 mg/dL (ref >39.00)
LDL Cholesterol: 61 mg/dL (ref 0–99)
NonHDL: 78.18
Total CHOL/HDL Ratio: 2
Triglycerides: 84 mg/dL (ref 0.0–149.0)
VLDL: 16.8 mg/dL (ref 0.0–40.0)

## 2017-12-07 LAB — HEMOGLOBIN A1C: Hgb A1c MFr Bld: 6.3 % (ref 4.6–6.5)

## 2017-12-07 LAB — TSH: TSH: 2.25 u[IU]/mL (ref 0.35–4.50)

## 2017-12-07 LAB — VITAMIN D 25 HYDROXY (VIT D DEFICIENCY, FRACTURES): VITD: 48.96 ng/mL (ref 30.00–100.00)

## 2017-12-07 NOTE — Patient Instructions (Signed)
Check sugars once weekly want fasting sugars 70 to 130 and nonfasting 100 to 150 ish  Carbohydrate Counting for Diabetes Mellitus, Adult Carbohydrate counting is a method for keeping track of how many carbohydrates you eat. Eating carbohydrates naturally increases the amount of sugar (glucose) in the blood. Counting how many carbohydrates you eat helps keep your blood glucose within normal limits, which helps you manage your diabetes (diabetes mellitus). It is important to know how many carbohydrates you can safely have in each meal. This is different for every person. A diet and nutrition specialist (registered dietitian) can help you make a meal plan and calculate how many carbohydrates you should have at each meal and snack. Carbohydrates are found in the following foods:  Grains, such as breads and cereals.  Dried beans and soy products.  Starchy vegetables, such as potatoes, peas, and corn.  Fruit and fruit juices.  Milk and yogurt.  Sweets and snack foods, such as cake, cookies, candy, chips, and soft drinks.  How do I count carbohydrates? There are two ways to count carbohydrates in food. You can use either of the methods or a combination of both. Reading "Nutrition Facts" on packaged food The "Nutrition Facts" list is included on the labels of almost all packaged foods and beverages in the [place] It includes:  The serving size.  Information about nutrients in each serving, including the grams (g) of carbohydrate per serving.  To use the "Nutrition Facts":  Decide how many servings you will have.  Multiply the number of servings by the number of carbohydrates per serving.  The resulting number is the total amount of carbohydrates that you will be having.  Learning standard serving sizes of other foods When you eat foods containing carbohydrates that are not packaged or do not include "Nutrition Facts" on the label, you need to measure the servings in order to count the  amount of carbohydrates:  Measure the foods that you will eat with a food scale or measuring cup, if needed.  Decide how many standard-size servings you will eat.  Multiply the number of servings by 15. Most carbohydrate-rich foods have about 15 g of carbohydrates per serving. ? For example, if you eat 8 oz (170 g) of strawberries, you will have eaten 2 servings and 30 g of carbohydrates (2 servings x 15 g = 30 g).  For foods that have more than one food mixed, such as soups and casseroles, you must count the carbohydrates in each food that is included.  The following list contains standard serving sizes of common carbohydrate-rich foods. Each of these servings has about 15 g of carbohydrates:   hamburger bun or  English muffin.   oz (15 mL) syrup.   oz (14 g) jelly.  1 slice of bread.  1 six-inch tortilla.  3 oz (85 g) cooked rice or pasta.  4 oz (113 g) cooked dried beans.  4 oz (113 g) starchy vegetable, such as peas, corn, or potatoes.  4 oz (113 g) hot cereal.  4 oz (113 g) mashed potatoes or  of a large baked potato.  4 oz (113 g) canned or frozen fruit.  4 oz (120 mL) fruit juice.  4-6 crackers.  6 chicken nuggets.  6 oz (170 g) unsweetened dry cereal.  6 oz (170 g) plain fat-free yogurt or yogurt sweetened with artificial sweeteners.  8 oz (240 mL) milk.  8 oz (170 g) fresh fruit or one small piece of fruit.  24 oz (680  g) popped popcorn.  Example of carbohydrate counting Sample meal  3 oz (85 g) chicken breast.  6 oz (170 g) brown rice.  4 oz (113 g) corn.  8 oz (240 mL) milk.  8 oz (170 g) strawberries with sugar-free whipped topping. Carbohydrate calculation 1. Identify the foods that contain carbohydrates: ? Rice. ? Corn. ? Milk. ? Strawberries. 2. Calculate how many servings you have of each food: ? 2 servings rice. ? 1 serving corn. ? 1 serving milk. ? 1 serving strawberries. 3. Multiply each number of servings by 15 g: ? 2  servings rice x 15 g = 30 g. ? 1 serving corn x 15 g = 15 g. ? 1 serving milk x 15 g = 15 g. ? 1 serving strawberries x 15 g = 15 g. 4. Add together all of the amounts to find the total grams of carbohydrates eaten: ? 30 g + 15 g + 15 g + 15 g = 75 g of carbohydrates total. This information is not intended to replace advice given to you by your health care provider. Make sure you discuss any questions you have with your health care provider. Document Released: 06/20/2005 Document Revised: 01/08/2016 Document Reviewed: 12/02/2015 Elsevier Interactive Patient Education  [employer].

## 2017-12-07 NOTE — Assessment & Plan Note (Signed)
Taking vitamin d OTC daily, 5000 IU daily, check levels

## 2017-12-07 NOTE — Assessment & Plan Note (Signed)
Encouraged heart healthy diet, increase exercise, avoid trans fats, consider a krill oil cap daily 

## 2017-12-07 NOTE — Progress Notes (Signed)
Subjective:  I acted as a Education administrator for Dr. Charlett Blake. [name], [place]  Patient ID: Natalie Swanson, female    DOB: 07/29/1935, 82 y.o.   MRN: [id number]  No chief complaint on file.   HPI  Patient is in today for a 6 month follow up and she feels well. No recent febrile illness or hospitalizations. No polyuria or polydipsia. She continues To stay active work in her garden regularly and eat a heart healthy diet. Denies CP/palp/SOB/HA/congestion/fevers/GI or GU c/o. Taking meds as prescribed Patient Care Team: Mosie Lukes, MD as PCP - General (Family Medicine)   Past Medical History:  Diagnosis Date  . Abnormal liver function tests 04/03/2015  . Atopic dermatitis 01/11/2012  . Cataract 07-04-2006   b/l  . Chicken pox as a child  . DM type 2, goal A1c below 7 07/18/2012  . Flashers or floaters, right eye 9-12   floaters  . History of shingles   . History of tobacco abuse 01/11/2012  . Hx of adenomatous colonic polyps 11/07/2014  . Hyperglycemia 07/18/2012  . Hyperlipidemia   . Hypertension   . Measles as a child  . Medicare annual wellness visit, subsequent 04/17/2011   G4P1 s/p 1 svd, no abnormal papas, menarche at 3 menopause at 32, regular   . Mumps as a child  . Pain in joint, ankle and foot 04/27/2014   right  . Shingles 9-12  . Vitamin D deficiency 09/14/2014    Past Surgical History:  Procedure Laterality Date  . BREAST LUMPECTOMY WITH RADIOACTIVE SEED LOCALIZATION Left 11/16/2016   Procedure: LEFT BREAST LUMPECTOMY WITH RADIOACTIVE SEED LOCALIZATION;  Surgeon: Jovita Kussmaul, MD;  Location: [facility];  Service: General;  Laterality: Left;  . CATARACT EXTRACTION W/ INTRAOCULAR LENS  IMPLANT, BILATERAL Bilateral 07-04-2006  . CESAREAN SECTION  1981   X 1    Family History  Problem Relation Age of Onset  . Hyperlipidemia Mother   . Hypertension Mother   . Heart disease Mother        MI  . Heart attack Father   . Diabetes Father        type 2  . Cancer Father        type  unknown  . Heart attack Brother   . Heart disease Brother 78       MI  . Hemophilia Son        x 2  . Hypertension Son   . Colon cancer Neg Hx   . Stomach cancer Neg Hx     Social History   Socioeconomic History  . Marital status: Widowed    Spouse name: Not on file  . Number of children: 4  . Years of education: Not on file  . Highest education level: Not on file  Occupational History  . Occupation: retired  Scientific laboratory technician  . Financial resource strain: Not on file  . Food insecurity:    Worry: Not on file    Inability: Not on file  . Transportation needs:    Medical: Not on file    Non-medical: Not on file  Tobacco Use  . Smoking status: Former Smoker    Packs/day: 0.20    Years: 61.00    Pack years: 12.20    Types: Cigarettes  . Smokeless tobacco: Never Used  . Tobacco comment: uses e cigs ; quit smoking cigarettes 2013.  Substance and Sexual Activity  . Alcohol use: No    Alcohol/week: 0.0 oz  .  Drug use: No  . Sexual activity: Never    Comment: no dietary restrictions. lives with self.   Lifestyle  . Physical activity:    Days per week: Not on file    Minutes per session: Not on file  . Stress: Not on file  Relationships  . Social connections:    Talks on phone: Not on file    Gets together: Not on file    Attends religious service: Not on file    Active member of club or organization: Not on file    Attends meetings of clubs or organizations: Not on file    Relationship status: Not on file  . Intimate partner violence:    Fear of current or ex partner: Not on file    Emotionally abused: Not on file    Physically abused: Not on file    Forced sexual activity: Not on file  Other Topics Concern  . Not on file  Social History Narrative  . Not on file    Outpatient Medications Prior to Visit  Medication Sig Dispense Refill  . aspirin EC 81 MG tablet Take 81 mg by mouth daily as needed (for headache relief.).    Marland Kitchen calamine lotion Apply 1 application  topically 4 (four) times daily as needed (for itching/irritated skin.).    Marland Kitchen calcium carbonate (OSCAL) 1500 (600 Ca) MG TABS tablet Take 600 mg by mouth See admin instructions. 1 TABLET IN THE AFTERNOON 3-4 TIMES A WEEK.    . Cholecalciferol (VITAMIN D3) 5000 UNITS CAPS Take 5,000 Units by mouth 2 (two) times daily. MORNING & AFTERNOON    . conjugated estrogens (PREMARIN) vaginal cream Place 1 Applicatorful vaginally daily. 42.5 g 1  . Omega-3 Fatty Acids (FISH OIL) 1200 MG CAPS Take 1,200 mg by mouth daily.    . propranolol (INDERAL) 40 MG tablet TAKE 1 TABLET TWICE DAILY 180 tablet 2  . simvastatin (ZOCOR) 40 MG tablet TAKE 1 TABLET AT BEDTIME 90 tablet 2  . Vitamin D, Ergocalciferol, (DRISDOL) 50000 units CAPS capsule TAKE 1 CAPSULE EVERY 7 (SEVEN) DAYS. (Patient taking differently: TAKE 1 CAPSULE EVERY SUNDAY MORNING.) 12 capsule 0  . alendronate (FOSAMAX) 70 MG tablet Take 1 tablet (70 mg total) by mouth once a week. Take with a full glass of water on an empty stomach. (Patient not taking: Reported on 06/08/2017) 4 tablet 4  . HYDROcodone-acetaminophen (NORCO/VICODIN) 5-325 MG tablet Take 1-2 tablets by mouth every 4 (four) hours as needed for moderate pain or severe pain. (Patient not taking: Reported on 06/08/2017) 15 tablet 0   No facility-administered medications prior to visit.     Allergies  Allergen Reactions  . Sulfa Antibiotics Rash    Review of Systems  Constitutional: Negative for fever and malaise/fatigue.  HENT: Negative for congestion.   Eyes: Negative for blurred vision.  Respiratory: Negative for shortness of breath.   Cardiovascular: Negative for chest pain, palpitations and leg swelling.  Gastrointestinal: Negative for abdominal pain, blood in stool and nausea.  Genitourinary: Negative for dysuria and frequency.  Musculoskeletal: Negative for falls.  Skin: Negative for rash.  Neurological: Negative for dizziness, loss of consciousness and headaches.    Endo/Heme/Allergies: Negative for environmental allergies.  Psychiatric/Behavioral: Negative for depression. The patient is not nervous/anxious.        Objective:    Physical Exam  Constitutional: She is oriented to person, place, and time. She appears well-developed and well-nourished. No distress.  HENT:  Head: Normocephalic and atraumatic.  Nose:  Nose normal.  Eyes: Right eye exhibits no discharge. Left eye exhibits no discharge.  Neck: Normal range of motion. Neck supple.  Cardiovascular: Normal rate and regular rhythm.  No murmur heard. Pulmonary/Chest: Effort normal and breath sounds normal.  Abdominal: Soft. Bowel sounds are normal. There is no tenderness.  Musculoskeletal: She exhibits no edema.  Neurological: She is alert and oriented to person, place, and time.  Skin: Skin is warm and dry.  Psychiatric: She has a normal mood and affect.  Nursing note and vitals reviewed.   BP 108/80 (BP Location: Left Arm, Patient Position: Sitting, Cuff Size: Normal)   Pulse (!) 51   Temp 97.6 F (36.4 C) (Oral)   Resp 18   Wt 136 lb 12.8 oz (62.1 kg)   SpO2 96%   BMI 27.58 kg/m  Wt Readings from Last 3 Encounters:  12/07/17 136 lb 12.8 oz (62.1 kg)  06/08/17 138 lb 9.6 oz (62.9 kg)  12/08/16 136 lb 9.6 oz (62 kg)   BP Readings from Last 3 Encounters:  12/07/17 108/80  06/08/17 (!) 123/55  12/08/16 (!) 102/48     Immunization History  Administered Date(s) Administered  . Influenza Split 04/06/2011, 07/18/2012  . Influenza, High Dose Seasonal PF 06/08/2017  . Influenza,inj,Quad PF,6+ Mos 03/13/2013, 04/18/2014, 03/27/2015  . Pneumococcal Conjugate-13 07/26/2013  . Pneumococcal Polysaccharide-23 06/25/2015  . Tdap 04/06/2011    Health Maintenance  Topic Date Due  . OPHTHALMOLOGY EXAM  02/08/2017  . FOOT EXAM  07/28/2017  . URINE MICROALBUMIN  07/28/2017  . HEMOGLOBIN A1C  12/04/2017  . INFLUENZA VACCINE  02/01/2018  . TETANUS/TDAP  04/05/2021  . DEXA SCAN   Completed  . PNA vac Low Risk Adult  Completed    Lab Results  Component Value Date   WBC 7.7 06/05/2017   HGB 14.0 06/05/2017   HCT 42.5 06/05/2017   PLT 190.0 06/05/2017   GLUCOSE 146 (H) 06/05/2017   CHOL 153 06/05/2017   TRIG 215.0 (H) 06/05/2017   HDL 59.30 06/05/2017   LDLDIRECT 68.0 06/05/2017   LDLCALC 63 12/08/2016   ALT 14 06/05/2017   AST 23 06/05/2017   NA 141 06/05/2017   K 4.9 06/05/2017   CL 105 06/05/2017   CREATININE 0.89 06/05/2017   BUN 12 06/05/2017   CO2 30 06/05/2017   TSH 2.56 06/05/2017   HGBA1C 6.5 06/05/2017   MICROALBUR <0.7 07/28/2016    Lab Results  Component Value Date   TSH 2.56 06/05/2017   Lab Results  Component Value Date   WBC 7.7 06/05/2017   HGB 14.0 06/05/2017   HCT 42.5 06/05/2017   MCV 96.9 06/05/2017   PLT 190.0 06/05/2017   Lab Results  Component Value Date   NA 141 06/05/2017   K 4.9 06/05/2017   CO2 30 06/05/2017   GLUCOSE 146 (H) 06/05/2017   BUN 12 06/05/2017   CREATININE 0.89 06/05/2017   BILITOT 0.3 06/05/2017   ALKPHOS 66 06/05/2017   AST 23 06/05/2017   ALT 14 06/05/2017   PROT 6.9 06/05/2017   ALBUMIN 4.2 06/05/2017   CALCIUM 10.2 06/05/2017   ANIONGAP 9 11/09/2016   GFR 64.98 06/05/2017   Lab Results  Component Value Date   CHOL 153 06/05/2017   Lab Results  Component Value Date   HDL 59.30 06/05/2017   Lab Results  Component Value Date   LDLCALC 63 12/08/2016   Lab Results  Component Value Date   TRIG 215.0 (H) 06/05/2017   Lab Results  Component Value Date   CHOLHDL 3 06/05/2017   Lab Results  Component Value Date   HGBA1C 6.5 06/05/2017         Assessment & Plan:   Problem List Items Addressed This Visit    Hypertension    Well controlled, no changes to meds. Encouraged heart healthy diet such as the DASH diet and exercise as tolerated.       Relevant Orders   CBC   Comprehensive metabolic panel   TSH   Hyperlipidemia    Encouraged heart healthy diet, increase  exercise, avoid trans fats, consider a krill oil cap daily      Relevant Orders   Lipid panel   Type 2 diabetes mellitus with hemoglobin A1c goal of less than 7.0% (HCC)    Asymptomatic and well controlled is givena glucometer to keep an eye on sugars use as needed and weekly. Report numbers above normal consistently      Relevant Orders   Hemoglobin A1c   VITAMIN D 25 Hydroxy (Vit-D Deficiency, Fractures)   Vitamin D deficiency    Taking vitamin d OTC daily, 5000 IU daily, check levels         I have discontinued Tenna Child. Thammavong's alendronate and HYDROcodone-acetaminophen. I am also having her maintain her Vitamin D3, Vitamin D (Ergocalciferol), Fish Oil, calcium carbonate, aspirin EC, calamine, propranolol, conjugated estrogens, and simvastatin.  No orders of the defined types were placed in this encounter.   CMA served as Education administrator during this visit. History, Physical and Plan performed by medical provider. Documentation and orders reviewed and attested to.  Penni Homans, MD

## 2017-12-07 NOTE — Assessment & Plan Note (Signed)
Well controlled, no changes to meds. Encouraged heart healthy diet such as the DASH diet and exercise as tolerated.  °

## 2017-12-07 NOTE — Assessment & Plan Note (Signed)
Asymptomatic and well controlled is givena glucometer to keep an eye on sugars use as needed and weekly. Report numbers above normal consistently

## 2018-01-10 NOTE — Progress Notes (Addendum)
Subjective:   Natalie Swanson is a 82 y.o. female who presents for Medicare Annual (Subsequent) preventive examination.  Pt enjoys crossword puzzles. Pt states she has fiance in [place] that she sees on occasion.  Review of Systems: No ROS.  Medicare Wellness Visit. Additional risk factors are reflected in the social history.   Sleep patterns: 7-8 hrs per night. Home Safety/Smoke Alarms: Feels safe in home. Smoke alarms in place.  Living environment; residence and Firearm Safety: Lives in [place] with her pitbull "Daisy".   Female:        Mammo- ordered      Dexa scan- utd       CCS- No longer doing routine screening due to age. Eye- yearly. Hx cataract sx with lens implants. Wears readers.    Objective:     Vitals: BP 138/82 (BP Location: Left Arm, Patient Position: Sitting, Cuff Size: Normal)   Pulse (!) 58   Ht '4\' 11"'  (1.499 m)   Wt 136 lb (61.7 kg)   SpO2 98%   BMI 27.47 kg/m   Body mass index is 27.47 kg/m.  Advanced Directives 01/16/2018 11/09/2016 07/28/2016 06/25/2015 06/11/2014 04/18/2014  Does Patient Have a Medical Advance Directive? Yes Yes Yes Yes Yes No  Type of Paramedic of [place];Living will Living will [place];Living will [place];Living will [place];Living will -  Does patient want to make changes to medical advance directive? - No - Patient declined No - Patient declined No - Patient declined - -  Copy of [place] in Chart? Yes - Yes No - copy requested - -  Would patient like information on creating a medical advance directive? - - - - - Yes - Educational materials given    Tobacco Social History   Tobacco Use  Smoking Status Former Smoker  . Packs/day: 0.20  . Years: 61.00  . Pack years: 12.20  . Types: Cigarettes  Smokeless Tobacco Never Used  Tobacco Comment   uses e cigs ; quit smoking cigarettes 2013.     Counseling given: Not  Answered Comment: uses e cigs ; quit smoking cigarettes 2013.   Clinical Intake: Pain : No/denies pain     Past Medical History:  Diagnosis Date  . Abnormal liver function tests 04/03/2015  . Atopic dermatitis 01/11/2012  . Cataract 07-04-2006   b/l  . Chicken pox as a child  . DM type 2, goal A1c below 7 07/18/2012  . Flashers or floaters, right eye 9-12   floaters  . History of shingles   . History of tobacco abuse 01/11/2012  . Hx of adenomatous colonic polyps 11/07/2014  . Hyperglycemia 07/18/2012  . Hyperlipidemia   . Hypertension   . Measles as a child  . Medicare annual wellness visit, subsequent 04/17/2011   G4P1 s/p 1 svd, no abnormal papas, menarche at 21 menopause at 83, regular   . Mumps as a child  . Pain in joint, ankle and foot 04/27/2014   right  . Shingles 9-12  . Vitamin D deficiency 09/14/2014   Past Surgical History:  Procedure Laterality Date  . BREAST LUMPECTOMY WITH RADIOACTIVE SEED LOCALIZATION Left 11/16/2016   Procedure: LEFT BREAST LUMPECTOMY WITH RADIOACTIVE SEED LOCALIZATION;  Surgeon: Jovita Kussmaul, MD;  Location: [facility];  Service: General;  Laterality: Left;  . CATARACT EXTRACTION W/ INTRAOCULAR LENS  IMPLANT, BILATERAL Bilateral 07-04-2006  . [place]   X 1  Family History  Problem Relation Age of Onset  . Hyperlipidemia Mother   . Hypertension Mother   . Heart disease Mother        MI  . Heart attack Father   . Diabetes Father        type 2  . Cancer Father        type unknown  . Heart attack Brother   . Heart disease Brother 54       MI  . Hemophilia Son        x 2  . Hypertension Son   . Colon cancer Neg Hx   . Stomach cancer Neg Hx    Social History   Socioeconomic History  . Marital status: Widowed    Spouse name: Not on file  . Number of children: 4  . Years of education: Not on file  . Highest education level: Not on file  Occupational History  . Occupation: retired  Scientific laboratory technician  . Financial resource  strain: Not on file  . Food insecurity:    Worry: Not on file    Inability: Not on file  . Transportation needs:    Medical: Not on file    Non-medical: Not on file  Tobacco Use  . Smoking status: Former Smoker    Packs/day: 0.20    Years: 61.00    Pack years: 12.20    Types: Cigarettes  . Smokeless tobacco: Never Used  . Tobacco comment: uses e cigs ; quit smoking cigarettes 2013.  Substance and Sexual Activity  . Alcohol use: No    Alcohol/week: 0.0 oz  . Drug use: No  . Sexual activity: Yes  Lifestyle  . Physical activity:    Days per week: Not on file    Minutes per session: Not on file  . Stress: Not on file  Relationships  . Social connections:    Talks on phone: Not on file    Gets together: Not on file    Attends religious service: Not on file    Active member of club or organization: Not on file    Attends meetings of clubs or organizations: Not on file    Relationship status: Not on file  Other Topics Concern  . Not on file  Social History Narrative  . Not on file    Outpatient Encounter Medications as of 01/16/2018  Medication Sig  . ACCU-CHEK AVIVA PLUS test strip TEST BLOOD SUGAR DAILY AND AS NEEDED  . ACCU-CHEK SOFTCLIX LANCETS lancets USE TO CHECK BLOOD SUGAR DAILY AND AS NEEDED  . aspirin EC 81 MG tablet Take 81 mg by mouth daily as needed (for headache relief.).  Marland Kitchen Blood Glucose Monitoring Suppl (ACCU-CHEK AVIVA PLUS) w/Device KIT See admin instructions.  . Cholecalciferol (VITAMIN D3) 5000 UNITS CAPS Take 5,000 Units by mouth 2 (two) times daily. MORNING & AFTERNOON  . conjugated estrogens (PREMARIN) vaginal cream Place 1 Applicatorful vaginally daily.  . Omega-3 Fatty Acids (FISH OIL) 1200 MG CAPS Take 1,200 mg by mouth daily.  . propranolol (INDERAL) 40 MG tablet TAKE 1 TABLET TWICE DAILY  . simvastatin (ZOCOR) 40 MG tablet TAKE 1 TABLET AT BEDTIME  . calamine lotion Apply 1 application topically 4 (four) times daily as needed (for  itching/irritated skin.).  . [DISCONTINUED] calcium carbonate (OSCAL) 1500 (600 Ca) MG TABS tablet Take 600 mg by mouth See admin instructions. 1 TABLET IN THE AFTERNOON 3-4 TIMES A WEEK.  . [DISCONTINUED] Vitamin D, Ergocalciferol, (DRISDOL) 50000 units CAPS  capsule TAKE 1 CAPSULE EVERY 7 (SEVEN) DAYS. (Patient taking differently: TAKE 1 CAPSULE EVERY SUNDAY MORNING.)   No facility-administered encounter medications on file as of 01/16/2018.     Activities of Daily Living In your present state of health, do you have any difficulty performing the following activities: 01/16/2018  Hearing? N  Vision? N  Comment wears reading glasses.   Difficulty concentrating or making decisions? N  Walking or climbing stairs? N  Dressing or bathing? N  Doing errands, shopping? N  Preparing Food and eating ? N  Using the Toilet? N  In the past six months, have you accidently leaked urine? N  Do you have problems with loss of bowel control? N  Managing your Medications? N  Managing your Finances? N  Housekeeping or managing your Housekeeping? N  Some recent data might be hidden    Patient Care Team: Mosie Lukes, MD as PCP - General (Family Medicine)    Assessment:   This is a routine wellness examination for [place]. Physical assessment deferred to PCP.  Exercise Activities and Dietary recommendations Current Exercise Habits: Home exercise routine(does dvd's and rowing machine.), Time (Minutes): 30, Frequency (Times/Week): 3, Weekly Exercise (Minutes/Week): 90, Exercise limited by: None identified   Diet (meal preparation, eat out, water intake, caffeinated beverages, dairy products, fruits and vegetables): in general, a "healthy" diet  , well balanced, on average, 3 meals per day Breakfast: special k, 3 cups of coffee Lunch: Tomato sandwich or frozen lunch, milk or juice Dinner:  Slim fast shake every other day. Cooking varies.   Goals    . Remain active and independent. (pt-stated)      Healthy diet, exercise, and regular check up with PCP.         Fall Risk Fall Risk  01/16/2018 07/28/2016 06/25/2015 04/18/2014  Falls in the past year? No No No No   Depression Screen PHQ 2/9 Scores 01/16/2018 07/28/2016 06/25/2015 04/18/2014  PHQ - 2 Score 0 0 0 0     Cognitive Function Ad8 score reviewed for issues:  Issues making decisions:no  Less interest in hobbies / activities:no  Repeats questions, stories (family complaining):no  Trouble using ordinary gadgets (microwave, computer, phone):no  Forgets the month or year: no  Mismanaging finances: no  Remembering appts:no  Daily problems with thinking and/or memory:no Ad8 score is=0     MMSE - Mini Mental State Exam 07/28/2016 06/25/2015  Orientation to time 5 5  Orientation to Place 5 5  Registration 3 3  Attention/ Calculation 5 5  Recall 3 3  Language- name 2 objects 2 2  Language- repeat 1 1  Language- follow 3 step command 3 3  Language- read & follow direction 1 1  Write a sentence 1 1  Copy design 1 1  Total score 30 30        Immunization History  Administered Date(s) Administered  . Influenza Split 04/06/2011, 07/18/2012  . Influenza, High Dose Seasonal PF 06/08/2017  . Influenza,inj,Quad PF,6+ Mos 03/13/2013, 04/18/2014, 03/27/2015  . Pneumococcal Conjugate-13 07/26/2013  . Pneumococcal Polysaccharide-23 06/25/2015  . Tdap 04/06/2011    Screening Tests Health Maintenance  Topic Date Due  . OPHTHALMOLOGY EXAM  02/08/2017  . FOOT EXAM  07/28/2017  . URINE MICROALBUMIN  07/28/2017  . INFLUENZA VACCINE  02/01/2018  . HEMOGLOBIN A1C  06/08/2018  . TETANUS/TDAP  04/05/2021  . DEXA SCAN  Completed  . PNA vac Low Risk Adult  Completed      Plan:  Check sugars once weekly want fasting sugars 70 to 130 and nonfasting 100 to 150 ish--- per [facility] 12/07/17.   Please schedule your next medicare wellness visit with me in 1 yr.  Continue to eat heart healthy diet (full of fruits,  vegetables, whole grains, lean protein, water--limit salt, fat, and sugar intake) and increase physical activity as tolerated.  Continue doing brain stimulating activities (puzzles, reading, adult coloring books, staying active) to keep memory sharp.     I have personally reviewed and noted the following in the patient's chart:   . Medical and social history . Use of alcohol, tobacco or illicit drugs  . Current medications and supplements . Functional ability and status . Nutritional status . Physical activity . Advanced directives . List of other physicians . Hospitalizations, surgeries, and ER visits in previous 12 months . Vitals . Screenings to include cognitive, depression, and falls . Referrals and appointments  In addition, I have reviewed and discussed with patient certain preventive protocols, quality metrics, and best practice recommendations. A written personalized care plan for preventive services as well as general preventive health recommendations were provided to patient.     Shela Nevin, [place]  01/16/2018   Medical screening examination/treatment was performed by qualified clinical staff member and as supervising physician I was immediately available for consultation/collaboration. I have reviewed documentation and agree with assessment and plan.  Penni Homans, MD

## 2018-01-16 ENCOUNTER — Encounter: Payer: Self-pay | Admitting: *Deleted

## 2018-01-16 ENCOUNTER — Ambulatory Visit (INDEPENDENT_AMBULATORY_CARE_PROVIDER_SITE_OTHER): Payer: Medicare HMO | Admitting: *Deleted

## 2018-01-16 VITALS — BP 138/82 | HR 58 | Ht 59.0 in | Wt 136.0 lb

## 2018-01-16 DIAGNOSIS — Z0000 Encounter for general adult medical examination without abnormal findings: Secondary | ICD-10-CM

## 2018-01-16 DIAGNOSIS — Z1239 Encounter for other screening for malignant neoplasm of breast: Secondary | ICD-10-CM

## 2018-01-16 NOTE — Patient Instructions (Signed)
Check sugars once weekly want fasting sugars 70 to 130 and nonfasting 100 to 150 ish--- per [facility] 12/07/17.   Please schedule your next medicare wellness visit with me in 1 yr.  Continue to eat heart healthy diet (full of fruits, vegetables, whole grains, lean protein, water--limit salt, fat, and sugar intake) and increase physical activity as tolerated.  Continue doing brain stimulating activities (puzzles, reading, adult coloring books, staying active) to keep memory sharp.    Natalie Swanson , Thank you for taking time to come for your Medicare Wellness Visit. I appreciate your ongoing commitment to your health goals. Please review the following plan we discussed and let me know if I can assist you in the future.   These are the goals we discussed: Goals    . Remain active and independent. (pt-stated)     Healthy diet, exercise, and regular check up with PCP.         This is a list of the screening recommended for you and due dates:  Health Maintenance  Topic Date Due  . Eye exam for diabetics  02/08/2017  . Complete foot exam   07/28/2017  . Urine Protein Check  07/28/2017  . Flu Shot  02/01/2018  . Hemoglobin A1C  06/08/2018  . Tetanus Vaccine  04/05/2021  . DEXA scan (bone density measurement)  Completed  . Pneumonia vaccines  Completed    Health Maintenance for Postmenopausal Women Menopause is a normal process in which your reproductive ability comes to an end. This process happens gradually over a span of months to years, usually between the ages of 8 and 34. Menopause is complete when you have missed 12 consecutive menstrual periods. It is important to talk with your health care provider about some of the most common conditions that affect postmenopausal women, such as heart disease, cancer, and bone loss (osteoporosis). Adopting a healthy lifestyle and getting preventive care can help to promote your health and wellness. Those actions can also lower your chances of  developing some of these common conditions. What should I know about menopause? During menopause, you may experience a number of symptoms, such as:  Moderate-to-severe hot flashes.  Night sweats.  Decrease in sex drive.  Mood swings.  Headaches.  Tiredness.  Irritability.  Memory problems.  Insomnia.  Choosing to treat or not to treat menopausal changes is an individual decision that you make with your health care provider. What should I know about hormone replacement therapy and supplements? Hormone therapy products are effective for treating symptoms that are associated with menopause, such as hot flashes and night sweats. Hormone replacement carries certain risks, especially as you become older. If you are thinking about using estrogen or estrogen with progestin treatments, discuss the benefits and risks with your health care provider. What should I know about heart disease and stroke? Heart disease, heart attack, and stroke become more likely as you age. This may be due, in part, to the hormonal changes that your body experiences during menopause. These can affect how your body processes dietary fats, triglycerides, and cholesterol. Heart attack and stroke are both medical emergencies. There are many things that you can do to help prevent heart disease and stroke:  Have your blood pressure checked at least every 1-2 years. High blood pressure causes heart disease and increases the risk of stroke.  If you are 40-40 years old, ask your health care provider if you should take aspirin to prevent a heart attack or a stroke.  Do  not use any tobacco products, including cigarettes, chewing tobacco, or electronic cigarettes. If you need help quitting, ask your health care provider.  It is important to eat a healthy diet and maintain a healthy weight. ? Be sure to include plenty of vegetables, fruits, low-fat dairy products, and lean protein. ? Avoid eating foods that are high in solid  fats, added sugars, or salt (sodium).  Get regular exercise. This is one of the most important things that you can do for your health. ? Try to exercise for at least 150 minutes each week. The type of exercise that you do should increase your heart rate and make you sweat. This is known as moderate-intensity exercise. ? Try to do strengthening exercises at least twice each week. Do these in addition to the moderate-intensity exercise.  Know your numbers.Ask your health care provider to check your cholesterol and your blood glucose. Continue to have your blood tested as directed by your health care provider.  What should I know about cancer screening? There are several types of cancer. Take the following steps to reduce your risk and to catch any cancer development as early as possible. Breast Cancer  Practice breast self-awareness. ? This means understanding how your breasts normally appear and feel. ? It also means doing regular breast self-exams. Let your health care provider know about any changes, no matter how small.  If you are 70 or older, have a clinician do a breast exam (clinical breast exam or CBE) every year. Depending on your age, family history, and medical history, it may be recommended that you also have a yearly breast X-ray (mammogram).  If you have a family history of breast cancer, talk with your health care provider about genetic screening.  If you are at high risk for breast cancer, talk with your health care provider about having an MRI and a mammogram every year.  Breast cancer (BRCA) gene test is recommended for women who have family members with BRCA-related cancers. Results of the assessment will determine the need for genetic counseling and BRCA1 and for BRCA2 testing. BRCA-related cancers include these types: ? Breast. This occurs in males or females. ? Ovarian. ? Tubal. This may also be called fallopian tube cancer. ? Cancer of the abdominal or pelvic lining  (peritoneal cancer). ? Prostate. ? Pancreatic.  Cervical, Uterine, and Ovarian Cancer Your health care provider may recommend that you be screened regularly for cancer of the pelvic organs. These include your ovaries, uterus, and vagina. This screening involves a pelvic exam, which includes checking for microscopic changes to the surface of your cervix (Pap test).  For women ages 21-65, health care providers may recommend a pelvic exam and a Pap test every three years. For women ages 50-65, they may recommend the Pap test and pelvic exam, combined with testing for human papilloma virus (HPV), every five years. Some types of HPV increase your risk of cervical cancer. Testing for HPV may also be done on women of any age who have unclear Pap test results.  Other health care providers may not recommend any screening for nonpregnant women who are considered low risk for pelvic cancer and have no symptoms. Ask your health care provider if a screening pelvic exam is right for you.  If you have had past treatment for cervical cancer or a condition that could lead to cancer, you need Pap tests and screening for cancer for at least 20 years after your treatment. If Pap tests have been discontinued for  you, your risk factors (such as having a new sexual partner) need to be reassessed to determine if you should start having screenings again. Some women have medical problems that increase the chance of getting cervical cancer. In these cases, your health care provider may recommend that you have screening and Pap tests more often.  If you have a family history of uterine cancer or ovarian cancer, talk with your health care provider about genetic screening.  If you have vaginal bleeding after reaching menopause, tell your health care provider.  There are currently no reliable tests available to screen for ovarian cancer.  Lung Cancer Lung cancer screening is recommended for adults 56-93 years old who are at  high risk for lung cancer because of a history of smoking. A yearly low-dose CT scan of the lungs is recommended if you:  Currently smoke.  Have a history of at least 30 pack-years of smoking and you currently smoke or have quit within the past 15 years. A pack-year is smoking an average of one pack of cigarettes per day for one year.  Yearly screening should:  Continue until it has been 15 years since you quit.  Stop if you develop a health problem that would prevent you from having lung cancer treatment.  Colorectal Cancer  This type of cancer can be detected and can often be prevented.  Routine colorectal cancer screening usually begins at age 17 and continues through age 14.  If you have risk factors for colon cancer, your health care provider may recommend that you be screened at an earlier age.  If you have a family history of colorectal cancer, talk with your health care provider about genetic screening.  Your health care provider may also recommend using home test kits to check for hidden blood in your stool.  A small camera at the end of a tube can be used to examine your colon directly (sigmoidoscopy or colonoscopy). This is done to check for the earliest forms of colorectal cancer.  Direct examination of the colon should be repeated every 5-10 years until age 14. However, if early forms of precancerous polyps or small growths are found or if you have a family history or genetic risk for colorectal cancer, you may need to be screened more often.  Skin Cancer  Check your skin from head to toe regularly.  Monitor any moles. Be sure to tell your health care provider: ? About any new moles or changes in moles, especially if there is a change in a mole's shape or color. ? If you have a mole that is larger than the size of a pencil eraser.  If any of your family members has a history of skin cancer, especially at a young age, talk with your health care provider about genetic  screening.  Always use sunscreen. Apply sunscreen liberally and repeatedly throughout the day.  Whenever you are outside, protect yourself by wearing long sleeves, pants, a wide-brimmed hat, and sunglasses.  What should I know about osteoporosis? Osteoporosis is a condition in which bone destruction happens more quickly than new bone creation. After menopause, you may be at an increased risk for osteoporosis. To help prevent osteoporosis or the bone fractures that can happen because of osteoporosis, the following is recommended:  If you are 79-64 years old, get at least 1,000 mg of calcium and at least 600 mg of vitamin D per day.  If you are older than age 91 but younger than age 78, get at  least 1,200 mg of calcium and at least 600 mg of vitamin D per day.  If you are older than age 22, get at least 1,200 mg of calcium and at least 800 mg of vitamin D per day.  Smoking and excessive alcohol intake increase the risk of osteoporosis. Eat foods that are rich in calcium and vitamin D, and do weight-bearing exercises several times each week as directed by your health care provider. What should I know about how menopause affects my mental health? Depression may occur at any age, but it is more common as you become older. Common symptoms of depression include:  Low or sad mood.  Changes in sleep patterns.  Changes in appetite or eating patterns.  Feeling an overall lack of motivation or enjoyment of activities that you previously enjoyed.  Frequent crying spells.  Talk with your health care provider if you think that you are experiencing depression. What should I know about immunizations? It is important that you get and maintain your immunizations. These include:  Tetanus, diphtheria, and pertussis (Tdap) booster vaccine.  Influenza every year before the flu season begins.  Pneumonia vaccine.  Shingles vaccine.  Your health care provider may also recommend other  immunizations. This information is not intended to replace advice given to you by your health care provider. Make sure you discuss any questions you have with your health care provider. Document Released: 08/12/2005 Document Revised: 01/08/2016 Document Reviewed: 03/24/2015 Elsevier Interactive Patient Education  2018 [employer].

## 2018-01-24 ENCOUNTER — Other Ambulatory Visit: Payer: Self-pay | Admitting: Family Medicine

## 2018-06-12 ENCOUNTER — Encounter: Payer: Medicare HMO | Admitting: Family Medicine

## 2018-06-28 ENCOUNTER — Other Ambulatory Visit: Payer: Self-pay | Admitting: Family Medicine

## 2018-07-05 ENCOUNTER — Other Ambulatory Visit: Payer: Self-pay | Admitting: Family Medicine

## 2018-08-14 ENCOUNTER — Encounter: Payer: Medicare HMO | Admitting: Family Medicine

## 2018-09-25 ENCOUNTER — Encounter: Payer: Medicare HMO | Admitting: Family Medicine

## 2018-11-09 ENCOUNTER — Other Ambulatory Visit: Payer: Self-pay | Admitting: Family Medicine

## 2019-01-16 NOTE — Progress Notes (Signed)
Virtual Visit via Video Note  I connected with patient on 01/17/19 at  9:00 AM EDT by audio enabled telemedicine application and verified that I am speaking with the correct person using two identifiers.   THIS ENCOUNTER IS A VIRTUAL VISIT DUE TO COVID-19 - PATIENT WAS NOT SEEN IN THE OFFICE. PATIENT HAS CONSENTED TO VIRTUAL VISIT / TELEMEDICINE VISIT   Location of patient: home  Location of provider: office  I discussed the limitations of evaluation and management by telemedicine and the availability of in person appointments. The patient expressed understanding and agreed to proceed.   Subjective:   Natalie Swanson is a 83 y.o. female who presents for Medicare Annual (Subsequent) preventive examination.  Review of Systems: No ROS.  Medicare Wellness Virtual Visit.  Visual/audio telehealth visit, UTA vital signs.   See social history for additional risk factors. Cardiac Risk Factors include: advanced age (>98mn, >>6women);diabetes mellitus;dyslipidemia;hypertension Sleep patterns: No issues Home Safety/Smoke Alarms: Feels safe in home. Smoke alarms in place.  Lives in double wide. Has a pitbull named "Daisy". Just got a new little dog as well.  States her granddaughter or grandson stay with her almost every night.  Female:     Mammo-  declines     Dexa scan- declines CCS- No longer doing routine screening due to age.      Objective:    Advanced Directives 01/17/2019 01/16/2018 11/09/2016 07/28/2016 06/25/2015 06/11/2014 04/18/2014  Does Patient Have a Medical Advance Directive? Yes Yes Yes Yes Yes Yes No  Type of AParamed[place]of [place]air[place] Har[place]Living will H[place]Living will Living will HMurry[place]villeLiving will HKent NarrowsLiving will H[place]Living will -  Does patient want to make changes to medical advance directive? No - Patient declined - No - Patient declined No - Patient declined No  - Patient declined - -  Copy of [place] Gate [place]in Chart? Yes - validated most recent copy scanned in chart (See row information) Yes - Yes No - copy requested - -  Would patient like information on creating a medical advance directive? - - - - - - Yes - Educational materials given    Tobacco Social History   Tobacco Use  Smoking Status Former Smoker  . Packs/day: 0.20  . Years: 61.00  . Pack years: 12.20  . Types: Cigarettes  Smokeless Tobacco Never Used  Tobacco Comment   uses e cigs ; quit smoking cigarettes 2013.     Counseling given: Not Answered Comment: uses e cigs ; quit smoking cigarettes 2013.   Clinical Intake: Pain : No/denies pain     Past Medical History:  Diagnosis Date  . Abnormal liver function tests 04/03/2015  . Atopic dermatitis 01/11/2012  . Cataract 07-04-2006   b/l  . Chicken pox as a child  . DM type 2, goal A1c below 7 07/18/2012  . Flashers or floaters, right eye 9-12   floaters  . History of shingles   . History of tobacco abuse 01/11/2012  . Hx of adenomatous colonic polyps 11/07/2014  . Hyperglycemia 07/18/2012  . Hyperlipidemia   . Hypertension   . Measles as a child  . Medicare annual wellness visit, subsequent 04/17/2011   G4P1 s/p 1 svd, no abnormal papas, menarche at 89menopause at 89 regular   . Mumps as a child  . Pain in joint, ankle and foot 04/27/2014   right  . Shingles 9-12  . Vitamin D deficiency  09/14/2014   Past Surgical History:  Procedure Laterality Date  . BREAST LUMPECTOMY WITH RADIOACTIVE SEED LOCALIZATION Left 11/16/2016   Procedure: LEFT BREAST LUMPECTOMY WITH RADIOACTIVE SEED LOCALIZATION;  Surgeon: Jovita Kussmaul, MD;  Location: [facility];  Service: General;  Laterality: Left;  . CATARACT EXTRACTION W/ INTRAOCULAR LENS  IMPLANT, BILATERAL Bilateral 07-04-2006  . CESAREAN SECTION  1981   X 1   Family History  Problem Relation Age of Onset  . Hyperlipidemia Mother   . Hypertension Mother   . Heart disease  Mother        MI  . Heart attack Father   . Diabetes Father        type 2  . Cancer Father        type unknown  . Heart attack Brother   . Heart disease Brother 50       MI  . Hemophilia Son        x 2  . Hypertension Son   . Colon cancer Neg Hx   . Stomach cancer Neg Hx    Social History   Socioeconomic History  . Marital status: Widowed    Spouse name: Not on file  . Number of children: 4  . Years of education: Not on file  . Highest education level: Not on file  Occupational History  . Occupation: retired  Scientific laboratory technician  . Financial resource strain: Not on file  . Food insecurity    Worry: Not on file    Inability: Not on file  . Transportation needs    Medical: Not on file    Non-medical: Not on file  Tobacco Use  . Smoking status: Former Smoker    Packs/day: 0.20    Years: 61.00    Pack years: 12.20    Types: Cigarettes  . Smokeless tobacco: Never Used  . Tobacco comment: uses e cigs ; quit smoking cigarettes 2013.  Substance and Sexual Activity  . Alcohol use: No    Alcohol/week: 0.0 standard drinks  . Drug use: No  . Sexual activity: Yes  Lifestyle  . Physical activity    Days per week: Not on file    Minutes per session: Not on file  . Stress: Not on file  Relationships  . Social Herbalist on phone: Not on file    Gets together: Not on file    Attends religious service: Not on file    Active member of club or organization: Not on file    Attends meetings of clubs or organizations: Not on file    Relationship status: Not on file  Other Topics Concern  . Not on file  Social History Narrative  . Not on file    Outpatient Encounter Medications as of 01/17/2019  Medication Sig  . ACCU-CHEK AVIVA PLUS test strip TEST BLOOD SUGAR DAILY AND AS NEEDED  . ACCU-CHEK SOFTCLIX LANCETS lancets USE TO CHECK BLOOD SUGAR DAILY AND AS NEEDED  . aspirin EC 81 MG tablet Take 81 mg by mouth daily as needed (for headache relief.).  Marland Kitchen Blood Glucose  Monitoring Suppl (ACCU-CHEK AVIVA PLUS) w/Device KIT See admin instructions.  . calamine lotion Apply 1 application topically 4 (four) times daily as needed (for itching/irritated skin.).  Marland Kitchen Cholecalciferol (VITAMIN D3) 5000 UNITS CAPS Take 5,000 Units by mouth 2 (two) times daily. MORNING & AFTERNOON  . Omega-3 Fatty Acids (FISH OIL) 1200 MG CAPS Take 1,200 mg by mouth daily.  . propranolol (INDERAL)  40 MG tablet TAKE 1 TABLET TWICE DAILY  . simvastatin (ZOCOR) 40 MG tablet TAKE 1 TABLET AT BEDTIME  . [DISCONTINUED] conjugated estrogens (PREMARIN) vaginal cream Place 1 Applicatorful vaginally daily. (Patient not taking: Reported on 01/17/2019)   No facility-administered encounter medications on file as of 01/17/2019.     Activities of Daily Living In your present state of health, do you have any difficulty performing the following activities: 01/17/2019  Hearing? N  Vision? N  Difficulty concentrating or making decisions? N  Walking or climbing stairs? N  Dressing or bathing? N  Doing errands, shopping? N  Preparing Food and eating ? N  Using the Toilet? N  In the past six months, have you accidently leaked urine? N  Do you have problems with loss of bowel control? N  Managing your Medications? N  Managing your Finances? N  Housekeeping or managing your Housekeeping? N  Some recent data might be hidden    Patient Care Team: Mosie Lukes, MD as PCP - General (Family Medicine)    Assessment:   This is a routine wellness examination for [place]. Physical assessment deferred to PCP.  Exercise Activities and Dietary recommendations Current Exercise Habits: Home exercise routine, Type of exercise: walking, Time (Minutes): 20, Frequency (Times/Week): 2, Weekly Exercise (Minutes/Week): 40, Intensity: Mild Diet (meal preparation, eat out, water intake, caffeinated beverages, dairy products, fruits and vegetables): reports she eats well balanced and drinks plenty of water.  Goals    .  Lose 10lb     Lose weight with healthy diet and exercise.     . Remain active and independent. (pt-stated)     Healthy diet, exercise, and regular check up with PCP.         Fall Risk Fall Risk  01/17/2019 01/16/2018 07/28/2016 06/25/2015 04/18/2014  Falls in the past year? 0 No No No No     Depression Screen PHQ 2/9 Scores 01/17/2019 01/16/2018 07/28/2016 06/25/2015  PHQ - 2 Score 0 0 0 0     Cognitive Function Ad8 score reviewed for issues:  Issues making decisions:no  Less interest in hobbies / activities:no  Repeats questions, stories (family complaining):no  Trouble using ordinary gadgets (microwave, computer, phone):no  Forgets the month or year: no  Mismanaging finances: no  Remembering appts:no  Daily problems with thinking and/or memory:no Ad8 score is=0   MMSE - Mini Mental State Exam 07/28/2016 06/25/2015  Orientation to time 5 5  Orientation to Place 5 5  Registration 3 3  Attention/ Calculation 5 5  Recall 3 3  Language- name 2 objects 2 2  Language- repeat 1 1  Language- follow 3 step command 3 3  Language- read & follow direction 1 1  Write a sentence 1 1  Copy design 1 1  Total score 30 30        Immunization History  Administered Date(s) Administered  . Influenza Split 04/06/2011, 07/18/2012  . Influenza, High Dose Seasonal PF 06/08/2017  . Influenza,inj,Quad PF,6+ Mos 03/13/2013, 04/18/2014, 03/27/2015  . Pneumococcal Conjugate-13 07/26/2013  . Pneumococcal Polysaccharide-23 06/25/2015  . Tdap 04/06/2011    Screening Tests Health Maintenance  Topic Date Due  . OPHTHALMOLOGY EXAM  02/08/2017  . FOOT EXAM  07/28/2017  . URINE MICROALBUMIN  07/28/2017  . HEMOGLOBIN A1C  06/08/2018  . INFLUENZA VACCINE  02/02/2019  . TETANUS/TDAP  04/05/2021  . DEXA SCAN  Completed  . PNA vac Low Risk Adult  Completed      Plan:  See you next year!  Continue to eat heart healthy diet (full of fruits, vegetables, whole grains, lean protein,  water--limit salt, fat, and sugar intake) and increase physical activity as tolerated.  Continue doing brain stimulating activities (puzzles, reading, adult coloring books, staying active) to keep memory sharp.   Please schedule an appointment with Dr.Blyth.  I have personally reviewed and noted the following in the patient's chart:   . Medical and social history . Use of alcohol, tobacco or illicit drugs  . Current medications and supplements . Functional ability and status . Nutritional status . Physical activity . Advanced directives . List of other physicians . Hospitalizations, surgeries, and ER visits in previous 12 months . Vitals . Screenings to include cognitive, depression, and falls . Referrals and appointments  In addition, I have reviewed and discussed with patient certain preventive protocols, quality metrics, and best practice recommendations. A written personalized care plan for preventive services as well as general preventive health recommendations were provided to patient.     Shela Nevin, [place]  01/17/2019

## 2019-01-17 ENCOUNTER — Encounter: Payer: Self-pay | Admitting: *Deleted

## 2019-01-17 ENCOUNTER — Ambulatory Visit (INDEPENDENT_AMBULATORY_CARE_PROVIDER_SITE_OTHER): Payer: Medicare HMO | Admitting: *Deleted

## 2019-01-17 ENCOUNTER — Other Ambulatory Visit: Payer: Self-pay

## 2019-01-17 DIAGNOSIS — Z0000 Encounter for general adult medical examination without abnormal findings: Secondary | ICD-10-CM | POA: Diagnosis not present

## 2019-01-17 DIAGNOSIS — E119 Type 2 diabetes mellitus without complications: Secondary | ICD-10-CM | POA: Diagnosis not present

## 2019-01-17 NOTE — Patient Instructions (Signed)
See you next year!  Continue to eat heart healthy diet (full of fruits, vegetables, whole grains, lean protein, water--limit salt, fat, and sugar intake) and increase physical activity as tolerated.  Continue doing brain stimulating activities (puzzles, reading, adult coloring books, staying active) to keep memory sharp.   Please schedule an appointment with Dr.Blyth.   Natalie Swanson , Thank you for taking time to come for your Medicare Wellness Visit. I appreciate your ongoing commitment to your health goals. Please review the following plan we discussed and let me know if I can assist you in the future.   These are the goals we discussed: Goals    . Remain active and independent. (pt-stated)     Healthy diet, exercise, and regular check up with PCP.         This is a list of the screening recommended for you and due dates:  Health Maintenance  Topic Date Due  . Eye exam for diabetics  02/08/2017  . Complete foot exam   07/28/2017  . Urine Protein Check  07/28/2017  . Hemoglobin A1C  06/08/2018  . Flu Shot  02/02/2019  . Tetanus Vaccine  04/05/2021  . DEXA scan (bone density measurement)  Completed  . Pneumonia vaccines  Completed    Health Maintenance After Age 87 After age 41, you are at a higher risk for certain long-term diseases and infections as well as injuries from falls. Falls are a major cause of broken bones and head injuries in people who are older than age 61. Getting regular preventive care can help to keep you healthy and well. Preventive care includes getting regular testing and making lifestyle changes as recommended by your health care provider. Talk with your health care provider about:  Which screenings and tests you should have. A screening is a test that checks for a disease when you have no symptoms.  A diet and exercise plan that is right for you. What should I know about screenings and tests to prevent falls? Screening and testing are the best ways to  find a health problem early. Early diagnosis and treatment give you the best chance of managing medical conditions that are common after age 8. Certain conditions and lifestyle choices may make you more likely to have a fall. Your health care provider may recommend:  Regular vision checks. Poor vision and conditions such as cataracts can make you more likely to have a fall. If you wear glasses, make sure to get your prescription updated if your vision changes.  Medicine review. Work with your health care provider to regularly review all of the medicines you are taking, including over-the-counter medicines. Ask your health care provider about any side effects that may make you more likely to have a fall. Tell your health care provider if any medicines that you take make you feel dizzy or sleepy.  Osteoporosis screening. Osteoporosis is a condition that causes the bones to get weaker. This can make the bones weak and cause them to break more easily.  Blood pressure screening. Blood pressure changes and medicines to control blood pressure can make you feel dizzy.  Strength and balance checks. Your health care provider may recommend certain tests to check your strength and balance while standing, walking, or changing positions.  Foot health exam. Foot pain and numbness, as well as not wearing proper footwear, can make you more likely to have a fall.  Depression screening. You may be more likely to have a fall if you have  a fear of falling, feel emotionally low, or feel unable to do activities that you used to do.  Alcohol use screening. Using too much alcohol can affect your balance and may make you more likely to have a fall. What actions can I take to lower my risk of falls? General instructions  Talk with your health care provider about your risks for falling. Tell your health care provider if: ? You fall. Be sure to tell your health care provider about all falls, even ones that seem minor. ?  You feel dizzy, sleepy, or off-balance.  Take over-the-counter and prescription medicines only as told by your health care provider. These include any supplements.  Eat a healthy diet and maintain a healthy weight. A healthy diet includes low-fat dairy products, low-fat (lean) meats, and fiber from whole grains, beans, and lots of fruits and vegetables. Home safety  Remove any tripping hazards, such as rugs, cords, and clutter.  Install safety equipment such as grab bars in bathrooms and safety rails on stairs.  Keep rooms and walkways well-lit. Activity   Follow a regular exercise program to stay fit. This will help you maintain your balance. Ask your health care provider what types of exercise are appropriate for you.  If you need a cane or walker, use it as recommended by your health care provider.  Wear supportive shoes that have nonskid soles. Lifestyle  Do not drink alcohol if your health care provider tells you not to drink.  If you drink alcohol, limit how much you have: ? 0-1 drink a day for women. ? 0-2 drinks a day for men.  Be aware of how much alcohol is in your drink. In the [place] one drink equals one typical bottle of beer (12 oz), one-half glass of wine (5 oz), or one shot of hard liquor (1 oz).  Do not use any products that contain nicotine or tobacco, such as cigarettes and e-cigarettes. If you need help quitting, ask your health care provider. Summary  Having a healthy lifestyle and getting preventive care can help to protect your health and wellness after age 66.  Screening and testing are the best way to find a health problem early and help you avoid having a fall. Early diagnosis and treatment give you the best chance for managing medical conditions that are more common for people who are older than age 42.  Falls are a major cause of broken bones and head injuries in people who are older than age 43. Take precautions to prevent a fall at home.  Work with  your health care provider to learn what changes you can make to improve your health and wellness and to prevent falls. This information is not intended to replace advice given to you by your health care provider. Make sure you discuss any questions you have with your health care provider. Document Released: 05/03/2017 Document Revised: 10/11/2018 Document Reviewed: 05/03/2017 Elsevier Patient Education  2020 [employer].

## 2019-02-15 ENCOUNTER — Other Ambulatory Visit: Payer: Self-pay | Admitting: Family Medicine

## 2019-04-17 ENCOUNTER — Other Ambulatory Visit: Payer: Self-pay | Admitting: Family Medicine

## 2020-01-20 NOTE — Progress Notes (Signed)
I connected with [name] today by telephone and verified that I am speaking with the correct person using two identifiers. Location patient: home Location provider: work Persons participating in the virtual visit: patient, Marine scientist.    I discussed the limitations, risks, security and privacy concerns of performing an evaluation and management service by telephone and the availability of in person appointments. I also discussed with the patient that there may be a patient responsible charge related to this service. The patient expressed understanding and verbally consented to this telephonic visit.    Interactive audio and video telecommunications were attempted between this provider and patient, however failed, due to patient having technical difficulties OR patient did not have access to video capability.  We continued and completed visit with audio only.  Some vital signs may be absent or patient reported.   Subjective:   Natalie Swanson is a 84 y.o. female who presents for Medicare Annual (Subsequent) preventive examination.  Review of Systems    Cardiac Risk Factors include: advanced age (>16mn, >>16women);dyslipidemia;diabetes mellitus     Objective:    There were no vitals filed for this visit. There is no height or weight on file to calculate BMI.  Advanced Directives 01/21/2020 01/17/2019 01/16/2018 11/09/2016 07/28/2016 06/25/2015 06/11/2014  Does Patient Have a Medical Advance Directive? Yes Yes Yes Yes Yes Yes Yes  Type of AParamedicof AMcKittrickLiving will HVeronaLiving will HThree CreeksLiving will Living will HMincoLiving will HDobbs FerryLiving will HAmericusLiving will  Does patient want to make changes to medical advance directive? No - Patient declined No - Patient declined - No - Patient declined No - Patient declined No - Patient declined -  Copy of  HCenterburgin Chart? Yes - validated most recent copy scanned in chart (See row information) Yes - validated most recent copy scanned in chart (See row information) Yes - Yes No - copy requested -  Would patient like information on creating a medical advance directive? - - - - - - -    Current Medications (verified) Outpatient Encounter Medications as of 01/21/2020  Medication Sig  . ACCU-CHEK AVIVA PLUS test strip TEST BLOOD SUGAR DAILY AND AS NEEDED  . ACCU-CHEK SOFTCLIX LANCETS lancets USE TO CHECK BLOOD SUGAR DAILY AND AS NEEDED  . aspirin EC 81 MG tablet Take 81 mg by mouth daily as needed (for headache relief.).  .Marland KitchenBlood Glucose Monitoring Suppl (ACCU-CHEK AVIVA PLUS) w/Device KIT See admin instructions.  . calamine lotion Apply 1 application topically 4 (four) times daily as needed (for itching/irritated skin.).  .Marland KitchenCholecalciferol (VITAMIN D3) 5000 UNITS CAPS Take 5,000 Units by mouth 2 (two) times daily. MORNING & AFTERNOON  . Omega-3 Fatty Acids (FISH OIL) 1200 MG CAPS Take 1,200 mg by mouth daily.  . propranolol (INDERAL) 40 MG tablet TAKE 1 TABLET TWICE DAILY  . simvastatin (ZOCOR) 40 MG tablet TAKE 1 TABLET AT BEDTIME   No facility-administered encounter medications on file as of 01/21/2020.    Allergies (verified) Sulfa antibiotics   History: Past Medical History:  Diagnosis Date  . Abnormal liver function tests 04/03/2015  . Atopic dermatitis 01/11/2012  . Cataract 07-04-2006   b/l  . Chicken pox as a child  . DM type 2, goal A1c below 7 07/18/2012  . Flashers or floaters, right eye 9-12   floaters  . History of shingles   . History of tobacco  abuse 01/11/2012  . Hx of adenomatous colonic polyps 11/07/2014  . Hyperglycemia 07/18/2012  . Hyperlipidemia   . Hypertension   . Measles as a child  . Medicare annual wellness visit, subsequent 04/17/2011   G4P1 s/p 1 svd, no abnormal papas, menarche at 69 menopause at 45, regular   . Mumps as a child  . Pain  in joint, ankle and foot 04/27/2014   right  . Shingles 9-12  . Vitamin D deficiency 09/14/2014   Past Surgical History:  Procedure Laterality Date  . BREAST LUMPECTOMY WITH RADIOACTIVE SEED LOCALIZATION Left 11/16/2016   Procedure: LEFT BREAST LUMPECTOMY WITH RADIOACTIVE SEED LOCALIZATION;  Surgeon: Jovita Kussmaul, MD;  Location: [facility];  Service: General;  Laterality: Left;  . CATARACT EXTRACTION W/ INTRAOCULAR LENS  IMPLANT, BILATERAL Bilateral 07-04-2006  . CESAREAN SECTION  1981   X 1   Family History  Problem Relation Age of Onset  . Hyperlipidemia Mother   . Hypertension Mother   . Heart disease Mother        MI  . Heart attack Father   . Diabetes Father        type 2  . Cancer Father        type unknown  . Heart attack Brother   . Heart disease Brother 26       MI  . Hemophilia Son        x 2  . Hypertension Son   . Colon cancer Neg Hx   . Stomach cancer Neg Hx    Social History   Socioeconomic History  . Marital status: Widowed    Spouse name: Not on file  . Number of children: 4  . Years of education: Not on file  . Highest education level: Not on file  Occupational History  . Occupation: retired  Tobacco Use  . Smoking status: Former Smoker    Packs/day: 0.20    Years: 61.00    Pack years: 12.20    Types: Cigarettes  . Smokeless tobacco: Never Used  . Tobacco comment: uses e cigs ; quit smoking cigarettes 2013.  Substance and Sexual Activity  . Alcohol use: No    Alcohol/week: 0.0 standard drinks  . Drug use: No  . Sexual activity: Yes  Other Topics Concern  . Not on file  Social History Narrative  . Not on file   Social Determinants of Health   Financial Resource Strain: Medium Risk  . Difficulty of Paying Living Expenses: Somewhat hard  Food Insecurity: No Food Insecurity  . Worried About Charity fundraiser in the Last Year: Never true  . Ran Out of Food in the Last Year: Never true  Transportation Needs: No Transportation Needs  . Lack of  Transportation (Medical): No  . Lack of Transportation (Non-Medical): No  Physical Activity:   . Days of Exercise per Week:   . Minutes of Exercise per Session:   Stress:   . Feeling of Stress :   Social Connections:   . Frequency of Communication with Friends and Family:   . Frequency of Social Gatherings with Friends and Family:   . Attends Religious Services:   . Active Member of Clubs or Organizations:   . Attends Archivist Meetings:   Marland Kitchen Marital Status:     Tobacco Counseling Counseling given: Not Answered Comment: uses e cigs ; quit smoking cigarettes 2013.   Clinical Intake: Pain : No/denies pain    Activities of Daily Living In  your present state of health, do you have any difficulty performing the following activities: 01/21/2020  Hearing? N  Vision? N  Difficulty concentrating or making decisions? N  Walking or climbing stairs? N  Dressing or bathing? N  Doing errands, shopping? N  Preparing Food and eating ? N  Using the Toilet? N  In the past six months, have you accidently leaked urine? N  Do you have problems with loss of bowel control? N  Managing your Medications? N  Managing your Finances? N  Housekeeping or managing your Housekeeping? N  Some recent data might be hidden    Patient Care Team: Mosie Lukes, MD as PCP - General (Family Medicine)  Indicate any recent Medical Services you may have received from other than [facility] providers in the past year (date may be approximate).     Assessment:   This is a routine wellness examination for Moore.  Dietary issues and exercise activities discussed: Current Exercise Habits: Home exercise routine, Type of exercise: walking;calisthenics, Time (Minutes): 30, Frequency (Times/Week): 7, Weekly Exercise (Minutes/Week): 210, Intensity: Mild, Exercise limited by: None identified Diet (meal preparation, eat out, water intake, caffeinated beverages, dairy products, fruits and vegetables): well  balanced    Goals    .  Remain active and independent. (pt-stated)      Healthy diet, exercise, and regular check up with PCP.        Depression Screen PHQ 2/9 Scores 01/21/2020 01/17/2019 01/16/2018 07/28/2016 06/25/2015 04/18/2014  PHQ - 2 Score 0 0 0 0 0 0    Fall Risk Fall Risk  01/21/2020 01/17/2019 01/16/2018 07/28/2016 06/25/2015  Falls in the past year? 0 0 No No No  Number falls in past yr: 0 - - - -  Injury with Fall? 0 - - - -  Follow up Education provided;Falls prevention discussed - - - -   Pt lives in 1 story home w/ her pitbull "Daisy" and small puppy "Buddy". Any stairs in or around the home? No  If so, are there any without handrails? No  Home free of loose throw rugs in walkways, pet beds, electrical cords, etc? Yes  Adequate lighting in your home to reduce risk of falls? Yes   ASSISTIVE DEVICES UTILIZED TO PREVENT FALLS:  Life alert? No  Use of a cane, walker or w/c? No  Grab bars in the bathroom? No  Shower chair or bench in shower? No  Elevated toilet seat or a handicapped toilet? No     Cognitive Function: Ad8 score reviewed for issues:  Issues making decisions:no  Less interest in hobbies / activities:no  Repeats questions, stories (family complaining):no  Trouble using ordinary gadgets (microwave, computer, phone):no  Forgets the month or year: no  Mismanaging finances: no  Remembering appts:no  Daily problems with thinking and/or memory:no Ad8 score is=0     MMSE - Mini Mental State Exam 07/28/2016 06/25/2015  Orientation to time 5 5  Orientation to Place 5 5  Registration 3 3  Attention/ Calculation 5 5  Recall 3 3  Language- name 2 objects 2 2  Language- repeat 1 1  Language- follow 3 step command 3 3  Language- read & follow direction 1 1  Write a sentence 1 1  Copy design 1 1  Total score 30 30        Immunizations Immunization History  Administered Date(s) Administered  . Influenza Split 04/06/2011, 07/18/2012  .  Influenza, High Dose Seasonal PF 06/08/2017  . Influenza,inj,Quad PF,6+ Mos  03/13/2013, 04/18/2014, 03/27/2015  . Pneumococcal Conjugate-13 07/26/2013  . Pneumococcal Polysaccharide-23 06/25/2015  . Tdap 04/06/2011    TDAP status: Up to date Pneumococcal vaccine status: Up to date Covid-19 vaccine status: Declined, Education has been provided regarding the importance of this vaccine but patient still declined. Advised may receive this vaccine at local pharmacy or Health Dept.or vaccine clinic. Aware to provide a copy of the vaccination record if obtained from local pharmacy or Health Dept. Verbalized acceptance and understanding.  Qualifies for Shingles Vaccine? Yes   Zostavax completed No   Shingrix Completed?: No.    Education has been provided regarding the importance of this vaccine. Patient has been advised to call insurance company to determine out of pocket expense if they have not yet received this vaccine. Advised may also receive vaccine at local pharmacy or Health Dept. Verbalized acceptance and understanding.  Screening Tests Health Maintenance  Topic Date Due  . COVID-19 Vaccine (1) Never done  . OPHTHALMOLOGY EXAM  02/08/2017  . FOOT EXAM  07/28/2017  . URINE MICROALBUMIN  07/28/2017  . HEMOGLOBIN A1C  06/08/2018  . INFLUENZA VACCINE  02/02/2020  . TETANUS/TDAP  04/05/2021  . DEXA SCAN  Completed  . PNA vac Low Risk Adult  Completed    Health Maintenance  Health Maintenance Due  Topic Date Due  . COVID-19 Vaccine (1) Never done  . OPHTHALMOLOGY EXAM  02/08/2017  . FOOT EXAM  07/28/2017  . URINE MICROALBUMIN  07/28/2017  . HEMOGLOBIN A1C  06/08/2018    Colorectal cancer screening: No longer required.  Pt declines mammogram and bone density.   Lung Cancer Screening: (Low Dose CT Chest recommended if Age 19-80 years, 30 pack-year currently smoking OR have quit w/in 15years.) does not qualify.    Additional Screening:  Vision Screening: Recommended annual  ophthalmology exams for early detection of glaucoma and other disorders of the eye.  Dental Screening: Recommended annual dental exams for proper oral hygiene  Community Resource Referral / Chronic Care Management: CRR required this visit?  No   CCM required this visit?  No      Plan:   See you next year!  Continue to eat heart healthy diet (full of fruits, vegetables, whole grains, lean protein, water--limit salt, fat, and sugar intake) and increase physical activity as tolerated.  Continue doing brain stimulating activities (puzzles, reading, adult coloring books, staying active) to keep memory sharp.    I have personally reviewed and noted the following in the patient's chart:   . Medical and social history . Use of alcohol, tobacco or illicit drugs  . Current medications and supplements . Functional ability and status . Nutritional status . Physical activity . Advanced directives . List of other physicians . Hospitalizations, surgeries, and ER visits in previous 12 months . Vitals . Screenings to include cognitive, depression, and falls . Referrals and appointments  In addition, I have reviewed and discussed with patient certain preventive protocols, quality metrics, and best practice recommendations. A written personalized care plan for preventive services as well as general preventive health recommendations were provided to patient.   Due to this being a telephonic visit, the after visit summary with patients personalized plan was offered to patient via mail or my-chart. Patient declined at this time.   Naaman Plummer [place], [place]   01/21/2020

## 2020-01-21 ENCOUNTER — Encounter: Payer: Self-pay | Admitting: *Deleted

## 2020-01-21 ENCOUNTER — Other Ambulatory Visit: Payer: Self-pay

## 2020-01-21 ENCOUNTER — Ambulatory Visit (INDEPENDENT_AMBULATORY_CARE_PROVIDER_SITE_OTHER): Payer: Medicare PPO | Admitting: *Deleted

## 2020-01-21 DIAGNOSIS — Z0000 Encounter for general adult medical examination without abnormal findings: Secondary | ICD-10-CM | POA: Diagnosis not present

## 2020-01-21 NOTE — Patient Instructions (Signed)
See you next year!  Continue to eat heart healthy diet (full of fruits, vegetables, whole grains, lean protein, water--limit salt, fat, and sugar intake) and increase physical activity as tolerated.  Continue doing brain stimulating activities (puzzles, reading, adult coloring books, staying active) to keep memory sharp.    Natalie Swanson , Thank you for taking time to come for your Medicare Wellness Visit. I appreciate your ongoing commitment to your health goals. Please review the following plan we discussed and let me know if I can assist you in the future.   These are the goals we discussed: Goals    .  Remain active and independent. (pt-stated)      Healthy diet, exercise, and regular check up with PCP.         This is a list of the screening recommended for you and due dates:  Health Maintenance  Topic Date Due  . Eye exam for diabetics  02/08/2017  . Complete foot exam   07/28/2017  . Urine Protein Check  07/28/2017  . Hemoglobin A1C  06/08/2018  . COVID-19 Vaccine (1) 02/06/2020*  . Flu Shot  02/02/2020  . Tetanus Vaccine  04/05/2021  . DEXA scan (bone density measurement)  Completed  . Pneumonia vaccines  Completed  *Topic was postponed. The date shown is not the original due date.    Preventive Care 13 Years and Older, Female Preventive care refers to lifestyle choices and visits with your health care provider that can promote health and wellness. This includes:  A yearly physical exam. This is also called an annual well check.  Regular dental and eye exams.  Immunizations.  Screening for certain conditions.  Healthy lifestyle choices, such as diet and exercise. What can I expect for my preventive care visit? Physical exam Your health care provider will check:  Height and weight. These may be used to calculate body mass index (BMI), which is a measurement that tells if you are at a healthy weight.  Heart rate and blood pressure.  Your skin for abnormal  spots. Counseling Your health care provider may ask you questions about:  Alcohol, tobacco, and drug use.  Emotional well-being.  Home and relationship well-being.  Sexual activity.  Eating habits.  History of falls.  Memory and ability to understand (cognition).  Work and work Statistician.  Pregnancy and menstrual history. What immunizations do I need?  Influenza (flu) vaccine  This is recommended every year. Tetanus, diphtheria, and pertussis (Tdap) vaccine  You may need a Td booster every 10 years. Varicella (chickenpox) vaccine  You may need this vaccine if you have not already been vaccinated. Zoster (shingles) vaccine  You may need this after age 76. Pneumococcal conjugate (PCV13) vaccine  One dose is recommended after age 34. Pneumococcal polysaccharide (PPSV23) vaccine  One dose is recommended after age 85. Measles, mumps, and rubella (MMR) vaccine  You may need at least one dose of MMR if you were born in 1957 or later. You may also need a second dose. Meningococcal conjugate (MenACWY) vaccine  You may need this if you have certain conditions. Hepatitis A vaccine  You may need this if you have certain conditions or if you travel or work in places where you may be exposed to hepatitis A. Hepatitis B vaccine  You may need this if you have certain conditions or if you travel or work in places where you may be exposed to hepatitis B. Haemophilus influenzae type b (Hib) vaccine  You may need this  if you have certain conditions. You may receive vaccines as individual doses or as more than one vaccine together in one shot (combination vaccines). Talk with your health care provider about the risks and benefits of combination vaccines. What tests do I need? Blood tests  Lipid and cholesterol levels. These may be checked every 5 years, or more frequently depending on your overall health.  Hepatitis C test.  Hepatitis B test. Screening  Lung cancer  screening. You may have this screening every year starting at age 86 if you have a 30-pack-year history of smoking and currently smoke or have quit within the past 15 years.  Colorectal cancer screening. All adults should have this screening starting at age 72 and continuing until age 55. Your health care provider may recommend screening at age 19 if you are at increased risk. You will have tests every 1-10 years, depending on your results and the type of screening test.  Diabetes screening. This is done by checking your blood sugar (glucose) after you have not eaten for a while (fasting). You may have this done every 1-3 years.  Mammogram. This may be done every 1-2 years. Talk with your health care provider about how often you should have regular mammograms.  BRCA-related cancer screening. This may be done if you have a family history of breast, ovarian, tubal, or peritoneal cancers. Other tests  Sexually transmitted disease (STD) testing.  Bone density scan. This is done to screen for osteoporosis. You may have this done starting at age 74. Follow these instructions at home: Eating and drinking  Eat a diet that includes fresh fruits and vegetables, whole grains, lean protein, and low-fat dairy products. Limit your intake of foods with high amounts of sugar, saturated fats, and salt.  Take vitamin and mineral supplements as recommended by your health care provider.  Do not drink alcohol if your health care provider tells you not to drink.  If you drink alcohol: ? Limit how much you have to 0-1 drink a day. ? Be aware of how much alcohol is in your drink. In the [place] one drink equals one 12 oz bottle of beer (355 mL), one 5 oz glass of wine (148 mL), or one 1 oz glass of hard liquor (44 mL). Lifestyle  Take daily care of your teeth and gums.  Stay active. Exercise for at least 30 minutes on 5 or more days each week.  Do not use any products that contain nicotine or tobacco, such as  cigarettes, e-cigarettes, and chewing tobacco. If you need help quitting, ask your health care provider.  If you are sexually active, practice safe sex. Use a condom or other form of protection in order to prevent STIs (sexually transmitted infections).  Talk with your health care provider about taking a low-dose aspirin or statin. What's next?  Go to your health care provider once a year for a well check visit.  Ask your health care provider how often you should have your eyes and teeth checked.  Stay up to date on all vaccines. This information is not intended to replace advice given to you by your health care provider. Make sure you discuss any questions you have with your health care provider. Document Revised: 06/14/2018 Document Reviewed: 06/14/2018 Elsevier Patient Education  2020 [employer].

## 2020-02-18 ENCOUNTER — Other Ambulatory Visit: Payer: Self-pay | Admitting: Family Medicine

## 2020-02-18 MED ORDER — PROPRANOLOL HCL 40 MG PO TABS: 40.0000 mg | ORAL_TABLET | Freq: Two times a day (BID) | ORAL | 2 refills | Status: DC

## 2020-02-18 MED ORDER — SIMVASTATIN 40 MG PO TABS: 40.0000 mg | ORAL_TABLET | Freq: Every day | ORAL | 2 refills | Status: DC

## 2020-02-18 NOTE — Telephone Encounter (Signed)
Refills sent

## 2020-02-18 NOTE — Telephone Encounter (Signed)
Pt needs refill on propranolol and simvastatin. She called [facility] pharmacy also.  Pt also updated her address.

## 2020-07-28 ENCOUNTER — Telehealth: Payer: Self-pay | Admitting: Family Medicine

## 2020-07-28 NOTE — Telephone Encounter (Signed)
Caller: Ubaldo Glassing [facility] ) Call back #: [phone]  Need order for True Matrix glucose meter , test strips, lancets, alcohol swap and control solution

## 2020-07-30 MED ORDER — ALCOHOL SWABS 70 % PADS: MEDICATED_PAD | 0 refills | Status: DC

## 2020-07-30 MED ORDER — TRUE METRIX LEVEL 3 HIGH VI SOLN: 0 refills | Status: AC

## 2020-07-30 MED ORDER — TRUEPLUS LANCETS 33G MISC: 0 refills | Status: DC

## 2020-07-30 MED ORDER — TRUE METRIX BLOOD GLUCOSE TEST VI STRP: ORAL_STRIP | 0 refills | Status: DC

## 2020-07-30 MED ORDER — TRUE METRIX AIR GLUCOSE METER W/DEVICE KIT: PACK | 0 refills | Status: DC

## 2020-07-30 NOTE — Telephone Encounter (Signed)
Spoke with patient and she has not been in because of covid.  Appointment made for her to come in March .

## 2020-09-08 ENCOUNTER — Other Ambulatory Visit: Payer: Self-pay | Admitting: Family Medicine

## 2020-09-17 ENCOUNTER — Telehealth: Payer: Medicare PPO | Admitting: Family Medicine

## 2020-10-24 ENCOUNTER — Other Ambulatory Visit: Payer: Self-pay | Admitting: Family Medicine

## 2020-10-27 ENCOUNTER — Encounter: Payer: Self-pay | Admitting: Family Medicine

## 2020-10-27 ENCOUNTER — Other Ambulatory Visit: Payer: Self-pay

## 2020-10-27 ENCOUNTER — Ambulatory Visit (INDEPENDENT_AMBULATORY_CARE_PROVIDER_SITE_OTHER): Payer: Medicare HMO | Admitting: Family Medicine

## 2020-10-27 VITALS — BP 110/64 | HR 75 | Temp 98.4°F | Resp 16 | Ht 59.0 in | Wt 149.2 lb

## 2020-10-27 DIAGNOSIS — E785 Hyperlipidemia, unspecified: Secondary | ICD-10-CM

## 2020-10-27 DIAGNOSIS — I10 Essential (primary) hypertension: Secondary | ICD-10-CM

## 2020-10-27 DIAGNOSIS — R945 Abnormal results of liver function studies: Secondary | ICD-10-CM | POA: Diagnosis not present

## 2020-10-27 DIAGNOSIS — E559 Vitamin D deficiency, unspecified: Secondary | ICD-10-CM | POA: Diagnosis not present

## 2020-10-27 DIAGNOSIS — R7989 Other specified abnormal findings of blood chemistry: Secondary | ICD-10-CM

## 2020-10-27 DIAGNOSIS — E119 Type 2 diabetes mellitus without complications: Secondary | ICD-10-CM

## 2020-10-27 LAB — COMPREHENSIVE METABOLIC PANEL
ALT: 15 U/L (ref 0–35)
AST: 25 U/L (ref 0–37)
Albumin: 4.2 g/dL (ref 3.5–5.2)
Alkaline Phosphatase: 79 U/L (ref 39–117)
BUN: 11 mg/dL (ref 6–23)
CO2: 29 mEq/L (ref 19–32)
Calcium: 9.6 mg/dL (ref 8.4–10.5)
Chloride: 103 mEq/L (ref 96–112)
Creatinine, Ser: 0.83 mg/dL (ref 0.40–1.20)
GFR: 65.52 mL/min (ref >60.00)
Glucose, Bld: 114 mg/dL — ABNORMAL HIGH (ref 70–99)
Potassium: 4.6 mEq/L (ref 3.5–5.1)
Sodium: 139 mEq/L (ref 135–145)
Total Bilirubin: 0.5 mg/dL (ref 0.2–1.2)
Total Protein: 7.1 g/dL (ref 6.0–8.3)

## 2020-10-27 LAB — CBC
HCT: 33.9 % — ABNORMAL LOW (ref 36.0–46.0)
Hemoglobin: 10.8 g/dL — ABNORMAL LOW (ref 12.0–15.0)
MCHC: 31.8 g/dL (ref 30.0–36.0)
MCV: 80.7 fl (ref 78.0–100.0)
Platelets: 227 10*3/uL (ref 150.0–400.0)
RBC: 4.21 Mil/uL (ref 3.87–5.11)
RDW: 18.7 % — ABNORMAL HIGH (ref 11.5–15.5)
WBC: 6.8 10*3/uL (ref 4.0–10.5)

## 2020-10-27 LAB — VITAMIN D 25 HYDROXY (VIT D DEFICIENCY, FRACTURES): VITD: 47.33 ng/mL (ref 30.00–100.00)

## 2020-10-27 LAB — LIPID PANEL
Cholesterol: 141 mg/dL (ref 0–200)
HDL: 56.1 mg/dL (ref >39.00)
LDL Cholesterol: 58 mg/dL (ref 0–99)
NonHDL: 84.98
Total CHOL/HDL Ratio: 3
Triglycerides: 133 mg/dL (ref 0.0–149.0)
VLDL: 26.6 mg/dL (ref 0.0–40.0)

## 2020-10-27 LAB — HEMOGLOBIN A1C: Hgb A1c MFr Bld: 7.3 % — ABNORMAL HIGH (ref 4.6–6.5)

## 2020-10-27 LAB — TSH: TSH: 2.43 u[IU]/mL (ref 0.35–4.50)

## 2020-10-27 NOTE — Assessment & Plan Note (Signed)
hgba1c acceptable, minimize simple carbs. Increase exercise as tolerated. Continue current meds 

## 2020-10-27 NOTE — Patient Instructions (Addendum)
Pulse oximeter to monitor at pharmacy, [employer] https://www.diabeteseducator.org/docs/default-source/living-with-diabetes/conquering-the-grocery-store-v1.pdf?sfvrsn=4">  Carbohydrate Counting for Diabetes Mellitus, Adult Carbohydrate counting is a method of keeping track of how many carbohydrates you eat. Eating carbohydrates naturally increases the amount of sugar (glucose) in the blood. Counting how many carbohydrates you eat improves your blood glucose control, which helps you manage your diabetes. It is important to know how many carbohydrates you can safely have in each meal. This is different for every person. A dietitian can help you make a meal plan and calculate how many carbohydrates you should have at each meal and snack. What foods contain carbohydrates? Carbohydrates are found in the following foods:  Grains, such as breads and cereals.  Dried beans and soy products.  Starchy vegetables, such as potatoes, peas, and corn.  Fruit and fruit juices.  Milk and yogurt.  Sweets and snack foods, such as cake, cookies, candy, chips, and soft drinks.   How do I count carbohydrates in foods? There are two ways to count carbohydrates in food. You can read food labels or learn standard serving sizes of foods. You can use either of the methods or a combination of both. Using the Nutrition Facts label The Nutrition Facts list is included on the labels of almost all packaged foods and beverages in the [place] It includes:  The serving size.  Information about nutrients in each serving, including the grams (g) of carbohydrate per serving. To use the Nutrition Facts:  Decide how many servings you will have.  Multiply the number of servings by the number of carbohydrates per serving.  The resulting number is the total amount of carbohydrates that you will be having. Learning the standard serving sizes of foods When you eat carbohydrate foods that are not packaged or do not include Nutrition  Facts on the label, you need to measure the servings in order to count the amount of carbohydrates.  Measure the foods that you will eat with a food scale or measuring cup, if needed.  Decide how many standard-size servings you will eat.  Multiply the number of servings by 15. For foods that contain carbohydrates, one serving equals 15 g of carbohydrates. ? For example, if you eat 2 cups or 10 oz (300 g) of strawberries, you will have eaten 2 servings and 30 g of carbohydrates (2 servings x 15 g = 30 g).  For foods that have more than one food mixed, such as soups and casseroles, you must count the carbohydrates in each food that is included. The following list contains standard serving sizes of common carbohydrate-rich foods. Each of these servings has about 15 g of carbohydrates:  1 slice of bread.  1 six-inch (15 cm) tortilla.  ? cup or 2 oz (53 g) cooked rice or pasta.   cup or 3 oz (85 g) cooked or canned, drained and rinsed beans or lentils.   cup or 3 oz (85 g) starchy vegetable, such as peas, corn, or squash.   cup or 4 oz (120 g) hot cereal.   cup or 3 oz (85 g) boiled or mashed potatoes, or  or 3 oz (85 g) of a large baked potato.   cup or 4 fl oz (118 mL) fruit juice.  1 cup or 8 fl oz (237 mL) milk.  1 small or 4 oz (106 g) apple.   or 2 oz (63 g) of a medium banana.  1 cup or 5 oz (150 g) strawberries.  3 cups or 1 oz (24 g)  popped popcorn. What is an example of carbohydrate counting? To calculate the number of carbohydrates in this sample meal, follow the steps shown below. Sample meal  3 oz (85 g) chicken breast.  ? cup or 4 oz (106 g) brown rice.   cup or 3 oz (85 g) corn.  1 cup or 8 fl oz (237 mL) milk.  1 cup or 5 oz (150 g) strawberries with sugar-free whipped topping. Carbohydrate calculation 1. Identify the foods that contain carbohydrates: ? Rice. ? Corn. ? Milk. ? Strawberries. 2. Calculate how many servings you have of each  food: ? 2 servings rice. ? 1 serving corn. ? 1 serving milk. ? 1 serving strawberries. 3. Multiply each number of servings by 15 g: ? 2 servings rice x 15 g = 30 g. ? 1 serving corn x 15 g = 15 g. ? 1 serving milk x 15 g = 15 g. ? 1 serving strawberries x 15 g = 15 g. 4. Add together all of the amounts to find the total grams of carbohydrates eaten: ? 30 g + 15 g + 15 g + 15 g = 75 g of carbohydrates total. What are tips for following this plan? Shopping  Develop a meal plan and then make a shopping list.  Buy fresh and frozen vegetables, fresh and frozen fruit, dairy, eggs, beans, lentils, and whole grains.  Look at food labels. Choose foods that have more fiber and less sugar.  Avoid processed foods and foods with added sugars. Meal planning  Aim to have the same amount of carbohydrates at each meal and for each snack time.  Plan to have regular, balanced meals and snacks. Where to find more information  American Diabetes Association: www.diabetes.org  Centers for Disease Control and Prevention: http://www.wolf.info/ Summary  Carbohydrate counting is a method of keeping track of how many carbohydrates you eat.  Eating carbohydrates naturally increases the amount of sugar (glucose) in the blood.  Counting how many carbohydrates you eat improves your blood glucose control, which helps you manage your diabetes.  A dietitian can help you make a meal plan and calculate how many carbohydrates you should have at each meal and snack. This information is not intended to replace advice given to you by your health care provider. Make sure you discuss any questions you have with your health care provider. Document Revised: 06/20/2019 Document Reviewed: 06/21/2019 Elsevier Patient Education  2021 Natalie Swanson should be in 90s Pulse should be 60-90  Shingrix is the new shingles shot, 2 shots over 2-6 months, confirm coverage with insurance and document, then can return here for  shots with nurse appt or at pharmacy https://www.diabeteseducator.org/docs/default-source/living-with-diabetes/conquering-the-grocery-store-v1.pdf?sfvrsn=4">  Carbohydrate Counting for Diabetes Mellitus, Adult Carbohydrate counting is a method of keeping track of how many carbohydrates you eat. Eating carbohydrates naturally increases the amount of sugar (glucose) in the blood. Counting how many carbohydrates you eat improves your blood glucose control, which helps you manage your diabetes. It is important to know how many carbohydrates you can safely have in each meal. This is different for every person. A dietitian can help you make a meal plan and calculate how many carbohydrates you should have at each meal and snack. What foods contain carbohydrates? Carbohydrates are found in the following foods:  Grains, such as breads and cereals.  Dried beans and soy products.  Starchy vegetables, such as potatoes, peas, and corn.  Fruit and fruit juices.  Milk and yogurt.  Sweets and snack foods,  such as cake, cookies, candy, chips, and soft drinks.   How do I count carbohydrates in foods? There are two ways to count carbohydrates in food. You can read food labels or learn standard serving sizes of foods. You can use either of the methods or a combination of both. Using the Nutrition Facts label The Nutrition Facts list is included on the labels of almost all packaged foods and beverages in the [place] It includes:  The serving size.  Information about nutrients in each serving, including the grams (g) of carbohydrate per serving. To use the Nutrition Facts:  Decide how many servings you will have.  Multiply the number of servings by the number of carbohydrates per serving.  The resulting number is the total amount of carbohydrates that you will be having. Learning the standard serving sizes of foods When you eat carbohydrate foods that are not packaged or do not include Nutrition Facts on  the label, you need to measure the servings in order to count the amount of carbohydrates.  Measure the foods that you will eat with a food scale or measuring cup, if needed.  Decide how many standard-size servings you will eat.  Multiply the number of servings by 15. For foods that contain carbohydrates, one serving equals 15 g of carbohydrates. ? For example, if you eat 2 cups or 10 oz (300 g) of strawberries, you will have eaten 2 servings and 30 g of carbohydrates (2 servings x 15 g = 30 g).  For foods that have more than one food mixed, such as soups and casseroles, you must count the carbohydrates in each food that is included. The following list contains standard serving sizes of common carbohydrate-rich foods. Each of these servings has about 15 g of carbohydrates:  1 slice of bread.  1 six-inch (15 cm) tortilla.  ? cup or 2 oz (53 g) cooked rice or pasta.   cup or 3 oz (85 g) cooked or canned, drained and rinsed beans or lentils.   cup or 3 oz (85 g) starchy vegetable, such as peas, corn, or squash.   cup or 4 oz (120 g) hot cereal.   cup or 3 oz (85 g) boiled or mashed potatoes, or  or 3 oz (85 g) of a large baked potato.   cup or 4 fl oz (118 mL) fruit juice.  1 cup or 8 fl oz (237 mL) milk.  1 small or 4 oz (106 g) apple.   or 2 oz (63 g) of a medium banana.  1 cup or 5 oz (150 g) strawberries.  3 cups or 1 oz (24 g) popped popcorn. What is an example of carbohydrate counting? To calculate the number of carbohydrates in this sample meal, follow the steps shown below. Sample meal  3 oz (85 g) chicken breast.  ? cup or 4 oz (106 g) brown rice.   cup or 3 oz (85 g) corn.  1 cup or 8 fl oz (237 mL) milk.  1 cup or 5 oz (150 g) strawberries with sugar-free whipped topping. Carbohydrate calculation 5. Identify the foods that contain carbohydrates: ? Rice. ? Corn. ? Milk. ? Strawberries. 6. Calculate how many servings you have of each food: ? 2  servings rice. ? 1 serving corn. ? 1 serving milk. ? 1 serving strawberries. 7. Multiply each number of servings by 15 g: ? 2 servings rice x 15 g = 30 g. ? 1 serving corn x 15 g = 15 g. ?  1 serving milk x 15 g = 15 g. ? 1 serving strawberries x 15 g = 15 g. 8. Add together all of the amounts to find the total grams of carbohydrates eaten: ? 30 g + 15 g + 15 g + 15 g = 75 g of carbohydrates total. What are tips for following this plan? Shopping  Develop a meal plan and then make a shopping list.  Buy fresh and frozen vegetables, fresh and frozen fruit, dairy, eggs, beans, lentils, and whole grains.  Look at food labels. Choose foods that have more fiber and less sugar.  Avoid processed foods and foods with added sugars. Meal planning  Aim to have the same amount of carbohydrates at each meal and for each snack time.  Plan to have regular, balanced meals and snacks. Where to find more information  American Diabetes Association: www.diabetes.org  Centers for Disease Control and Prevention: http://www.wolf.info/ Summary  Carbohydrate counting is a method of keeping track of how many carbohydrates you eat.  Eating carbohydrates naturally increases the amount of sugar (glucose) in the blood.  Counting how many carbohydrates you eat improves your blood glucose control, which helps you manage your diabetes.  A dietitian can help you make a meal plan and calculate how many carbohydrates you should have at each meal and snack. This information is not intended to replace advice given to you by your health care provider. Make sure you discuss any questions you have with your health care provider. Document Revised: 06/20/2019 Document Reviewed: 06/21/2019 Elsevier Patient Education  2021 Natalie Swanson [employer].

## 2020-10-27 NOTE — Assessment & Plan Note (Signed)
Supplement and monitor 

## 2020-10-27 NOTE — Assessment & Plan Note (Signed)
Check cmp today 

## 2020-10-27 NOTE — Assessment & Plan Note (Signed)
Well controlled, no changes to meds. Encouraged heart healthy diet such as the DASH diet and exercise as tolerated.  °

## 2020-10-27 NOTE — Assessment & Plan Note (Signed)
Encouraged heart healthy diet, increase exercise, avoid trans fats, consider a krill oil cap daily 

## 2020-10-28 ENCOUNTER — Other Ambulatory Visit: Payer: Self-pay | Admitting: Family Medicine

## 2020-10-28 ENCOUNTER — Other Ambulatory Visit: Payer: Self-pay

## 2020-10-28 DIAGNOSIS — E119 Type 2 diabetes mellitus without complications: Secondary | ICD-10-CM

## 2020-10-28 DIAGNOSIS — D649 Anemia, unspecified: Secondary | ICD-10-CM

## 2020-10-28 MED ORDER — METFORMIN HCL 500 MG PO TABS: 500.0000 mg | ORAL_TABLET | Freq: Every day | ORAL | 3 refills | Status: DC

## 2020-10-28 NOTE — Progress Notes (Signed)
Subjective:    Patient ID: Natalie Swanson, female    DOB: May 27, 1936, 85 y.o.   MRN: [id number]  Chief Complaint  Patient presents with  . Follow-up  . Diabetes  . Hypertension    Pt states that she had dental x-ray and mouth swelled up. Pt state that she still have pain in mouth.    HPI Patient is in today for follow up on chronic medical concerns such as diabetes, hypertension and more. No recent febrile illness or hospitalizations. She is feeling well today. She tries to maintain a heart healthy diet, is successful some days more than others. No regular exercise but she stays very active. Denies CP/palp/SOB/HA/congestion/fevers/GI or GU c/o. Taking meds as prescribed  Past Medical History:  Diagnosis Date  . Abnormal liver function tests 04/03/2015  . Atopic dermatitis 01/11/2012  . Cataract 07-04-2006   b/l  . Chicken pox as a child  . DM type 2, goal A1c below 7 07/18/2012  . Flashers or floaters, right eye 9-12   floaters  . History of shingles   . History of tobacco abuse 01/11/2012  . Hx of adenomatous colonic polyps 11/07/2014  . Hyperglycemia 07/18/2012  . Hyperlipidemia   . Hypertension   . Measles as a child  . Medicare annual wellness visit, subsequent 04/17/2011   G4P1 s/p 1 svd, no abnormal papas, menarche at 37 menopause at 19, regular   . Mumps as a child  . Pain in joint, ankle and foot 04/27/2014   right  . Shingles 9-12  . Vitamin D deficiency 09/14/2014    Past Surgical History:  Procedure Laterality Date  . BREAST LUMPECTOMY WITH RADIOACTIVE SEED LOCALIZATION Left 11/16/2016   Procedure: LEFT BREAST LUMPECTOMY WITH RADIOACTIVE SEED LOCALIZATION;  Surgeon: Jovita Kussmaul, MD;  Location: [facility];  Service: General;  Laterality: Left;  . CATARACT EXTRACTION W/ INTRAOCULAR LENS  IMPLANT, BILATERAL Bilateral 07-04-2006  . CESAREAN SECTION  1981   X 1    Family History  Problem Relation Age of Onset  . Hyperlipidemia Mother   . Hypertension Mother   . Heart  disease Mother        MI  . Heart attack Father   . Diabetes Father        type 2  . Cancer Father        type unknown  . Heart attack Brother   . Heart disease Brother 35       MI  . Hemophilia Son        x 2  . Hypertension Son   . Colon cancer Neg Hx   . Stomach cancer Neg Hx     Social History   Socioeconomic History  . Marital status: Widowed    Spouse name: Not on file  . Number of children: 4  . Years of education: Not on file  . Highest education level: Not on file  Occupational History  . Occupation: retired  Tobacco Use  . Smoking status: Former Smoker    Packs/day: 0.20    Years: 61.00    Pack years: 12.20    Types: Cigarettes  . Smokeless tobacco: Never Used  . Tobacco comment: uses e cigs ; quit smoking cigarettes 2013.  Substance and Sexual Activity  . Alcohol use: No    Alcohol/week: 0.0 standard drinks  . Drug use: No  . Sexual activity: Yes  Other Topics Concern  . Not on file  Social History Narrative  . Not on  file   Social Determinants of Health   Financial Resource Strain: Medium Risk  . Difficulty of Paying Living Expenses: Somewhat hard  Food Insecurity: No Food Insecurity  . Worried About Charity fundraiser in the Last Year: Never true  . Ran Out of Food in the Last Year: Never true  Transportation Needs: No Transportation Needs  . Lack of Transportation (Medical): No  . Lack of Transportation (Non-Medical): No  Physical Activity: Not on file  Stress: Not on file  Social Connections: Not on file  Intimate Partner Violence: Not on file    Outpatient Medications Prior to Visit  Medication Sig Dispense Refill  . Alcohol Swabs (B-D SINGLE USE SWABS REGULAR) PADS USE AS DIRECTED ONCE A DAY AND AS NEEDED 100 each 1  . aspirin EC 81 MG tablet Take 81 mg by mouth daily as needed (for headache relief.).    Marland Kitchen Blood Glucose Calibration (TRUE METRIX LEVEL 3) High SOLN Use as directed. 1 each 0  . Blood Glucose Monitoring Suppl (TRUE  METRIX AIR GLUCOSE METER) w/Device KIT Use as directed once a day and as needed.  Dx Code: E11.9 1 kit 0  . calamine lotion Apply 1 application topically 4 (four) times daily as needed (for itching/irritated skin.).    Marland Kitchen Cholecalciferol (VITAMIN D3) 5000 UNITS CAPS Take 5,000 Units by mouth 2 (two) times daily. MORNING & AFTERNOON    . glucose blood (TRUE METRIX BLOOD GLUCOSE TEST) test strip USE AS DIRECTED ONE TIME DAILY  AND AS NEEDED 100 strip 1  . Omega-3 Fatty Acids (FISH OIL) 1200 MG CAPS Take 1,200 mg by mouth daily.    . simvastatin (ZOCOR) 40 MG tablet TAKE 1 TABLET AT BEDTIME 90 tablet 2  . TRUEplus Lancets 33G MISC USE TO CHECK SUGAR ONCE A DAY AND AS NEEDED. 100 each 1  . propranolol (INDERAL) 40 MG tablet Take 1 tablet (40 mg total) by mouth 2 (two) times daily. 180 tablet 2   No facility-administered medications prior to visit.    Allergies  Allergen Reactions  . Sulfa Antibiotics Rash    Review of Systems  Constitutional: Negative for fever and malaise/fatigue.  HENT: Negative for congestion.   Eyes: Negative for blurred vision.  Respiratory: Negative for shortness of breath.   Cardiovascular: Negative for chest pain, palpitations and leg swelling.  Gastrointestinal: Negative for abdominal pain, blood in stool and nausea.  Genitourinary: Negative for dysuria and frequency.  Musculoskeletal: Negative for falls.  Skin: Negative for rash.  Neurological: Negative for dizziness, loss of consciousness and headaches.  Endo/Heme/Allergies: Negative for environmental allergies.  Psychiatric/Behavioral: Negative for depression. The patient is not nervous/anxious.        Objective:    Physical Exam Vitals and nursing note reviewed.  Constitutional:      General: She is not in acute distress.    Appearance: She is well-developed.  HENT:     Head: Normocephalic and atraumatic.     Nose: Nose normal.  Eyes:     General:        Right eye: No discharge.        Left eye:  No discharge.  Cardiovascular:     Rate and Rhythm: Normal rate and regular rhythm.     Heart sounds: No murmur heard.   Pulmonary:     Effort: Pulmonary effort is normal.     Breath sounds: Normal breath sounds.  Abdominal:     General: Bowel sounds are normal.  Palpations: Abdomen is soft.     Tenderness: There is no abdominal tenderness.  Musculoskeletal:     Cervical back: Normal range of motion and neck supple.  Skin:    General: Skin is warm and dry.  Neurological:     Mental Status: She is alert and oriented to person, place, and time.     BP 110/64   Pulse 75   Temp 98.4 F (36.9 C)   Resp 16   Ht '4\' 11"'  (1.499 m)   Wt 149 lb 3.2 oz (67.7 kg)   SpO2 95%   BMI 30.13 kg/m  Wt Readings from Last 3 Encounters:  10/27/20 149 lb 3.2 oz (67.7 kg)  01/16/18 136 lb (61.7 kg)  12/07/17 136 lb 12.8 oz (62.1 kg)    Diabetic Foot Exam - Simple   No data filed    Lab Results  Component Value Date   WBC 6.8 10/27/2020   HGB 10.8 (L) 10/27/2020   HCT 33.9 (L) 10/27/2020   PLT 227.0 10/27/2020   GLUCOSE 114 (H) 10/27/2020   CHOL 141 10/27/2020   TRIG 133.0 10/27/2020   HDL 56.10 10/27/2020   LDLDIRECT 68.0 06/05/2017   LDLCALC 58 10/27/2020   ALT 15 10/27/2020   AST 25 10/27/2020   NA 139 10/27/2020   K 4.6 10/27/2020   CL 103 10/27/2020   CREATININE 0.83 10/27/2020   BUN 11 10/27/2020   CO2 29 10/27/2020   TSH 2.43 10/27/2020   HGBA1C 7.3 (H) 10/27/2020   MICROALBUR <0.7 07/28/2016    Lab Results  Component Value Date   TSH 2.43 10/27/2020   Lab Results  Component Value Date   WBC 6.8 10/27/2020   HGB 10.8 (L) 10/27/2020   HCT 33.9 (L) 10/27/2020   MCV 80.7 10/27/2020   PLT 227.0 10/27/2020   Lab Results  Component Value Date   NA 139 10/27/2020   K 4.6 10/27/2020   CO2 29 10/27/2020   GLUCOSE 114 (H) 10/27/2020   BUN 11 10/27/2020   CREATININE 0.83 10/27/2020   BILITOT 0.5 10/27/2020   ALKPHOS 79 10/27/2020   AST 25 10/27/2020    ALT 15 10/27/2020   PROT 7.1 10/27/2020   ALBUMIN 4.2 10/27/2020   CALCIUM 9.6 10/27/2020   ANIONGAP 9 11/09/2016   GFR 65.52 10/27/2020   Lab Results  Component Value Date   CHOL 141 10/27/2020   Lab Results  Component Value Date   HDL 56.10 10/27/2020   Lab Results  Component Value Date   LDLCALC 58 10/27/2020   Lab Results  Component Value Date   TRIG 133.0 10/27/2020   Lab Results  Component Value Date   CHOLHDL 3 10/27/2020   Lab Results  Component Value Date   HGBA1C 7.3 (H) 10/27/2020       Assessment & Plan:   Problem List Items Addressed This Visit    Hypertension    Well controlled, no changes to meds. Encouraged heart healthy diet such as the DASH diet and exercise as tolerated.       Relevant Orders   TSH (Completed)   Hyperlipidemia    Encouraged heart healthy diet, increase exercise, avoid trans fats, consider a krill oil cap daily      Relevant Orders   CBC (Completed)   Lipid panel (Completed)   Type 2 diabetes mellitus with hemoglobin A1c goal of less than 7.0% (HCC) - Primary    hgba1c acceptable, minimize simple carbs. Increase exercise as tolerated. Continue current  meds      Relevant Orders   Hemoglobin A1c (Completed)   Comprehensive metabolic panel (Completed)   Vitamin D deficiency    Supplement and monitor      Relevant Orders   VITAMIN D 25 Hydroxy (Vit-D Deficiency, Fractures) (Completed)   Abnormal liver function tests    Check cmp today         I am having Tenna Child. Thammavong maintain her Vitamin D3, Fish Oil, aspirin EC, calamine, True Metrix Air Glucose Meter, True Metrix Level 3, B-D SINGLE USE SWABS REGULAR, True Metrix Blood Glucose Test, TRUEplus Lancets 33G, and simvastatin.  No orders of the defined types were placed in this encounter.    Penni Homans, MD

## 2020-11-04 ENCOUNTER — Other Ambulatory Visit: Payer: Self-pay | Admitting: Family Medicine

## 2020-11-27 ENCOUNTER — Other Ambulatory Visit: Payer: Medicare HMO

## 2020-12-02 ENCOUNTER — Other Ambulatory Visit: Payer: Self-pay

## 2020-12-02 ENCOUNTER — Other Ambulatory Visit (INDEPENDENT_AMBULATORY_CARE_PROVIDER_SITE_OTHER): Payer: Medicare HMO

## 2020-12-02 DIAGNOSIS — D649 Anemia, unspecified: Secondary | ICD-10-CM

## 2020-12-02 LAB — CBC WITH DIFFERENTIAL/PLATELET
Basophils Absolute: 0 10*3/uL (ref 0.0–0.1)
Basophils Relative: 0.5 % (ref 0.0–3.0)
Eosinophils Absolute: 0.3 10*3/uL (ref 0.0–0.7)
Eosinophils Relative: 3.9 % (ref 0.0–5.0)
HCT: 34.6 % — ABNORMAL LOW (ref 36.0–46.0)
Hemoglobin: 11.1 g/dL — ABNORMAL LOW (ref 12.0–15.0)
Lymphocytes Relative: 35.4 % (ref 12.0–46.0)
Lymphs Abs: 2.6 10*3/uL (ref 0.7–4.0)
MCHC: 32.1 g/dL (ref 30.0–36.0)
MCV: 80.9 fl (ref 78.0–100.0)
Monocytes Absolute: 0.6 10*3/uL (ref 0.1–1.0)
Monocytes Relative: 8 % (ref 3.0–12.0)
Neutro Abs: 3.8 10*3/uL (ref 1.4–7.7)
Neutrophils Relative %: 52.2 % (ref 43.0–77.0)
Platelets: 229 10*3/uL (ref 150.0–400.0)
RBC: 4.28 Mil/uL (ref 3.87–5.11)
RDW: 18.4 % — ABNORMAL HIGH (ref 11.5–15.5)
WBC: 7.3 10*3/uL (ref 4.0–10.5)

## 2020-12-10 ENCOUNTER — Other Ambulatory Visit: Payer: Self-pay | Admitting: Family Medicine

## 2020-12-12 ENCOUNTER — Emergency Department (HOSPITAL_COMMUNITY): Payer: Medicare HMO

## 2020-12-12 ENCOUNTER — Encounter (HOSPITAL_COMMUNITY): Payer: Self-pay

## 2020-12-12 ENCOUNTER — Other Ambulatory Visit: Payer: Self-pay

## 2020-12-12 ENCOUNTER — Inpatient Hospital Stay (HOSPITAL_COMMUNITY)
Admission: EM | Admit: 2020-12-12 | Discharge: 2020-12-15 | DRG: 378 | Disposition: A | Discharge status: Home / Self Care | Payer: Medicare HMO | Attending: Internal Medicine | Admitting: Internal Medicine

## 2020-12-12 DIAGNOSIS — D649 Anemia, unspecified: Secondary | ICD-10-CM

## 2020-12-12 DIAGNOSIS — K5731 Diverticulosis of large intestine without perforation or abscess with bleeding: Secondary | ICD-10-CM | POA: Diagnosis not present

## 2020-12-12 DIAGNOSIS — Z79899 Other long term (current) drug therapy: Secondary | ICD-10-CM | POA: Diagnosis not present

## 2020-12-12 DIAGNOSIS — R0902 Hypoxemia: Secondary | ICD-10-CM | POA: Diagnosis not present

## 2020-12-12 DIAGNOSIS — K449 Diaphragmatic hernia without obstruction or gangrene: Secondary | ICD-10-CM | POA: Diagnosis present

## 2020-12-12 DIAGNOSIS — D62 Acute posthemorrhagic anemia: Secondary | ICD-10-CM | POA: Diagnosis not present

## 2020-12-12 DIAGNOSIS — Z833 Family history of diabetes mellitus: Secondary | ICD-10-CM

## 2020-12-12 DIAGNOSIS — E559 Vitamin D deficiency, unspecified: Secondary | ICD-10-CM | POA: Diagnosis present

## 2020-12-12 DIAGNOSIS — K922 Gastrointestinal hemorrhage, unspecified: Secondary | ICD-10-CM | POA: Diagnosis not present

## 2020-12-12 DIAGNOSIS — Z20822 Contact with and (suspected) exposure to covid-19: Secondary | ICD-10-CM | POA: Diagnosis not present

## 2020-12-12 DIAGNOSIS — Z7984 Long term (current) use of oral hypoglycemic drugs: Secondary | ICD-10-CM

## 2020-12-12 DIAGNOSIS — E785 Hyperlipidemia, unspecified: Secondary | ICD-10-CM | POA: Diagnosis present

## 2020-12-12 DIAGNOSIS — K31A0 Gastric intestinal metaplasia, unspecified: Secondary | ICD-10-CM | POA: Diagnosis not present

## 2020-12-12 DIAGNOSIS — R29898 Other symptoms and signs involving the musculoskeletal system: Secondary | ICD-10-CM | POA: Diagnosis not present

## 2020-12-12 DIAGNOSIS — Z7982 Long term (current) use of aspirin: Secondary | ICD-10-CM | POA: Diagnosis not present

## 2020-12-12 DIAGNOSIS — Z8249 Family history of ischemic heart disease and other diseases of the circulatory system: Secondary | ICD-10-CM

## 2020-12-12 DIAGNOSIS — K2951 Unspecified chronic gastritis with bleeding: Secondary | ICD-10-CM | POA: Diagnosis not present

## 2020-12-12 DIAGNOSIS — I6930 Unspecified sequelae of cerebral infarction: Secondary | ICD-10-CM

## 2020-12-12 DIAGNOSIS — G459 Transient cerebral ischemic attack, unspecified: Secondary | ICD-10-CM | POA: Diagnosis not present

## 2020-12-12 DIAGNOSIS — D509 Iron deficiency anemia, unspecified: Secondary | ICD-10-CM | POA: Diagnosis not present

## 2020-12-12 DIAGNOSIS — K621 Rectal polyp: Secondary | ICD-10-CM | POA: Diagnosis not present

## 2020-12-12 DIAGNOSIS — K635 Polyp of colon: Secondary | ICD-10-CM | POA: Diagnosis not present

## 2020-12-12 DIAGNOSIS — E119 Type 2 diabetes mellitus without complications: Secondary | ICD-10-CM

## 2020-12-12 DIAGNOSIS — Z7952 Long term (current) use of systemic steroids: Secondary | ICD-10-CM | POA: Diagnosis not present

## 2020-12-12 DIAGNOSIS — Z83438 Family history of other disorder of lipoprotein metabolism and other lipidemia: Secondary | ICD-10-CM

## 2020-12-12 DIAGNOSIS — Z87891 Personal history of nicotine dependence: Secondary | ICD-10-CM

## 2020-12-12 DIAGNOSIS — Z9842 Cataract extraction status, left eye: Secondary | ICD-10-CM

## 2020-12-12 DIAGNOSIS — I63512 Cerebral infarction due to unspecified occlusion or stenosis of left middle cerebral artery: Secondary | ICD-10-CM

## 2020-12-12 DIAGNOSIS — Z9841 Cataract extraction status, right eye: Secondary | ICD-10-CM

## 2020-12-12 DIAGNOSIS — R531 Weakness: Secondary | ICD-10-CM | POA: Diagnosis not present

## 2020-12-12 DIAGNOSIS — R4781 Slurred speech: Secondary | ICD-10-CM | POA: Diagnosis not present

## 2020-12-12 DIAGNOSIS — I152 Hypertension secondary to endocrine disorders: Secondary | ICD-10-CM

## 2020-12-12 DIAGNOSIS — D126 Benign neoplasm of colon, unspecified: Secondary | ICD-10-CM | POA: Diagnosis not present

## 2020-12-12 DIAGNOSIS — K3189 Other diseases of stomach and duodenum: Secondary | ICD-10-CM | POA: Diagnosis not present

## 2020-12-12 DIAGNOSIS — K641 Second degree hemorrhoids: Secondary | ICD-10-CM | POA: Diagnosis not present

## 2020-12-12 DIAGNOSIS — K2950 Unspecified chronic gastritis without bleeding: Secondary | ICD-10-CM | POA: Diagnosis not present

## 2020-12-12 DIAGNOSIS — G8190 Hemiplegia, unspecified affecting unspecified side: Secondary | ICD-10-CM | POA: Diagnosis not present

## 2020-12-12 DIAGNOSIS — Z86010 Personal history of colonic polyps: Secondary | ICD-10-CM

## 2020-12-12 DIAGNOSIS — Z8673 Personal history of transient ischemic attack (TIA), and cerebral infarction without residual deficits: Secondary | ICD-10-CM

## 2020-12-12 DIAGNOSIS — Z809 Family history of malignant neoplasm, unspecified: Secondary | ICD-10-CM

## 2020-12-12 DIAGNOSIS — R195 Other fecal abnormalities: Secondary | ICD-10-CM | POA: Diagnosis not present

## 2020-12-12 DIAGNOSIS — Z961 Presence of intraocular lens: Secondary | ICD-10-CM | POA: Diagnosis present

## 2020-12-12 DIAGNOSIS — Z862 Personal history of diseases of the blood and blood-forming organs and certain disorders involving the immune mechanism: Secondary | ICD-10-CM

## 2020-12-12 DIAGNOSIS — E1165 Type 2 diabetes mellitus with hyperglycemia: Secondary | ICD-10-CM | POA: Diagnosis not present

## 2020-12-12 DIAGNOSIS — R29810 Facial weakness: Secondary | ICD-10-CM | POA: Diagnosis not present

## 2020-12-12 DIAGNOSIS — I10 Essential (primary) hypertension: Secondary | ICD-10-CM | POA: Diagnosis present

## 2020-12-12 DIAGNOSIS — K5730 Diverticulosis of large intestine without perforation or abscess without bleeding: Secondary | ICD-10-CM | POA: Diagnosis not present

## 2020-12-12 LAB — DIFFERENTIAL
Abs Immature Granulocytes: 0.04 10*3/uL (ref 0.00–0.07)
Basophils Absolute: 0 10*3/uL (ref 0.0–0.1)
Basophils Relative: 0 %
Eosinophils Absolute: 0.1 10*3/uL (ref 0.0–0.5)
Eosinophils Relative: 1 %
Immature Granulocytes: 0 %
Lymphocytes Relative: 31 %
Lymphs Abs: 3.1 10*3/uL (ref 0.7–4.0)
Monocytes Absolute: 0.7 10*3/uL (ref 0.1–1.0)
Monocytes Relative: 7 %
Neutro Abs: 6.2 10*3/uL (ref 1.7–7.7)
Neutrophils Relative %: 61 %

## 2020-12-12 LAB — CBC
HCT: 16.4 % — ABNORMAL LOW (ref 36.0–46.0)
Hemoglobin: 4.8 g/dL — CL (ref 12.0–15.0)
MCH: 26.2 pg (ref 26.0–34.0)
MCHC: 29.3 g/dL — ABNORMAL LOW (ref 30.0–36.0)
MCV: 89.6 fL (ref 80.0–100.0)
Platelets: 294 10*3/uL (ref 150–400)
RBC: 1.83 MIL/uL — ABNORMAL LOW (ref 3.87–5.11)
RDW: 18.2 % — ABNORMAL HIGH (ref 11.5–15.5)
WBC: 10.2 10*3/uL (ref 4.0–10.5)
nRBC: 0 % (ref 0.0–0.2)

## 2020-12-12 LAB — COMPREHENSIVE METABOLIC PANEL
ALT: 13 U/L (ref 0–44)
AST: 22 U/L (ref 15–41)
Albumin: 3.2 g/dL — ABNORMAL LOW (ref 3.5–5.0)
Alkaline Phosphatase: 49 U/L (ref 38–126)
Anion gap: 9 (ref 5–15)
BUN: 19 mg/dL (ref 8–23)
CO2: 20 mmol/L — ABNORMAL LOW (ref 22–32)
Calcium: 9.1 mg/dL (ref 8.9–10.3)
Chloride: 104 mmol/L (ref 98–111)
Creatinine, Ser: 0.97 mg/dL (ref 0.44–1.00)
GFR, Estimated: 58 mL/min — ABNORMAL LOW (ref >60)
Glucose, Bld: 195 mg/dL — ABNORMAL HIGH (ref 70–99)
Potassium: 3.8 mmol/L (ref 3.5–5.1)
Sodium: 133 mmol/L — ABNORMAL LOW (ref 135–145)
Total Bilirubin: 0.6 mg/dL (ref 0.3–1.2)
Total Protein: 5.9 g/dL — ABNORMAL LOW (ref 6.5–8.1)

## 2020-12-12 LAB — RESP PANEL BY RT-PCR (FLU A&B, COVID) ARPGX2
Influenza A by PCR: NEGATIVE
Influenza B by PCR: NEGATIVE
SARS Coronavirus 2 by RT PCR: NEGATIVE

## 2020-12-12 LAB — I-STAT CHEM 8, ED
BUN: 22 mg/dL (ref 8–23)
Calcium, Ion: 1.15 mmol/L (ref 1.15–1.40)
Chloride: 101 mmol/L (ref 98–111)
Creatinine, Ser: 0.8 mg/dL (ref 0.44–1.00)
Glucose, Bld: 190 mg/dL — ABNORMAL HIGH (ref 70–99)
HCT: 15 % — ABNORMAL LOW (ref 36.0–46.0)
Hemoglobin: 5.1 g/dL — CL (ref 12.0–15.0)
Potassium: 3.9 mmol/L (ref 3.5–5.1)
Sodium: 134 mmol/L — ABNORMAL LOW (ref 135–145)
TCO2: 21 mmol/L — ABNORMAL LOW (ref 22–32)

## 2020-12-12 LAB — APTT: aPTT: 23 seconds — ABNORMAL LOW (ref 24–36)

## 2020-12-12 LAB — GLUCOSE, CAPILLARY: Glucose-Capillary: 236 mg/dL — ABNORMAL HIGH (ref 70–99)

## 2020-12-12 LAB — POC OCCULT BLOOD, ED: Fecal Occult Bld: POSITIVE — AB

## 2020-12-12 LAB — PREPARE RBC (CROSSMATCH)

## 2020-12-12 LAB — PROTIME-INR
INR: 1 (ref 0.8–1.2)
Prothrombin Time: 13.6 seconds (ref 11.4–15.2)

## 2020-12-12 LAB — ABO/RH: ABO/RH(D): O POS

## 2020-12-12 MED ORDER — ACETAMINOPHEN 650 MG RE SUPP: 650.0000 mg | Freq: Four times a day (QID) | RECTAL | Status: DC | PRN

## 2020-12-12 MED ORDER — CLOPIDOGREL BISULFATE 75 MG PO TABS: 75.0000 mg | ORAL_TABLET | Freq: Every day | ORAL | Status: DC

## 2020-12-12 MED ORDER — SODIUM CHLORIDE 0.9% FLUSH
3.0000 mL | Freq: Two times a day (BID) | INTRAVENOUS | Status: DC
  Administered 2020-12-12 – 2020-12-14 (×3): 3 mL via INTRAVENOUS

## 2020-12-12 MED ORDER — ASPIRIN 81 MG PO CHEW: 81.0000 mg | CHEWABLE_TABLET | Freq: Every day | ORAL | Status: DC

## 2020-12-12 MED ORDER — PROPRANOLOL HCL 20 MG PO TABS
40.0000 mg | ORAL_TABLET | Freq: Two times a day (BID) | ORAL | Status: DC
  Administered 2020-12-12: 40 mg via ORAL
  Filled 2020-12-12: qty 2

## 2020-12-12 MED ORDER — SODIUM CHLORIDE 0.9% IV SOLUTION: Freq: Once | INTRAVENOUS | Status: DC

## 2020-12-12 MED ORDER — ACETAMINOPHEN 325 MG PO TABS
650.0000 mg | ORAL_TABLET | Freq: Four times a day (QID) | ORAL | Status: DC | PRN
Start: 2020-12-12 — End: 2020-12-15
  Filled 2020-12-12: qty 2

## 2020-12-12 MED ORDER — ASPIRIN 300 MG RE SUPP: 300.0000 mg | Freq: Every day | RECTAL | Status: DC

## 2020-12-12 MED ORDER — SODIUM CHLORIDE 0.9% FLUSH
3.0000 mL | INTRAVENOUS | Status: DC | PRN
Start: 2020-12-12 — End: 2020-12-15

## 2020-12-12 MED ORDER — SODIUM CHLORIDE 0.9 % IV SOLN: 10.0000 mL/h | Freq: Once | INTRAVENOUS | Status: DC

## 2020-12-12 MED ORDER — SODIUM CHLORIDE 0.9 % IV SOLN
80.0000 mg | Freq: Once | INTRAVENOUS | Status: AC
  Administered 2020-12-12: 80 mg via INTRAVENOUS
  Filled 2020-12-12: qty 80

## 2020-12-12 MED ORDER — SODIUM CHLORIDE 0.9 % IV SOLN: 250.0000 mL | INTRAVENOUS | Status: DC | PRN

## 2020-12-12 MED ORDER — PANTOPRAZOLE SODIUM 40 MG IV SOLR: 40.0000 mg | INTRAVENOUS | Status: DC

## 2020-12-12 MED ORDER — SIMVASTATIN 20 MG PO TABS
40.0000 mg | ORAL_TABLET | Freq: Every day | ORAL | Status: DC
  Administered 2020-12-12 – 2020-12-14 (×3): 40 mg via ORAL
  Filled 2020-12-12 (×3): qty 2

## 2020-12-12 MED ORDER — INSULIN ASPART 100 UNIT/ML IJ SOLN: 0.0000 [IU] | Freq: Three times a day (TID) | INTRAMUSCULAR | Status: DC

## 2020-12-12 MED ORDER — PANTOPRAZOLE SODIUM 40 MG IV SOLR
40.0000 mg | INTRAVENOUS | Status: DC
  Administered 2020-12-13: 40 mg via INTRAVENOUS
  Filled 2020-12-12: qty 40

## 2020-12-12 MED ORDER — SODIUM CHLORIDE 0.9% FLUSH: 3.0000 mL | Freq: Once | INTRAVENOUS | Status: DC

## 2020-12-12 NOTE — ED Notes (Signed)
[facility] called to cancel code stroke per Dr. Quinn Axe and RN Gwendolyn Fill

## 2020-12-12 NOTE — Consult Note (Addendum)
Referring Provider:  Family Medicine         Primary Care Physician:  Mosie Lukes, MD Primary Gastroenterologist:   Silvano Rusk, MD           We were asked to see this patient for:   anemia               ASSESSMENT / PLAN:   # 85 yo female in ED with stoke like symptoms and severe heme + anemia. No overt GI bleeding. Neurology ruled her out for stroke ( symptoms felt to be secondary to transient ischemia related to anemia). --Hgb 4.8, down from 11 at PCP's office ten days ago.  Oddly enough patient has not noted any overt GI bleeding despite the 6 gram drop in Hgb.  --Transfusion of PRBCs in progress (1 of 2).  --Since CVA ruled out we can start with an EGD tomorrow (takes NSAIDS) The risks and benefits of EGD with possible biopsies was discussed with the patient and she agrees to proceed. If EGD negative then may need to repeat colonoscopy  --Not overtly bleeding. She got an 80 mg bolus of Protonix Will start with 40 mg IV qday in the am.  --am CBC     Attending Physician Note   I have taken a history, examined the patient and reviewed the chart. I agree with the Advanced Practitioner's note, impression and recommendations.  Severe anemia and heme + stool, presented with neurologic symptoms and they are resolving with transfusions, IVF. R/O ulcer, AVM, neoplasm, etc.   Transfusions to maintain Hgb > 7 Trend CBC Pantoprazole 40 mg IV qd EGD tomorrow If EGD is not diagnostic will proceed with colonoscopy on Monday  Lucio Edward, MD [facility] [phone]       HPI:                                                                                                                             Chief Complaint: anemia  Natalie Swanson is a 85 y.o. female with a past medical history significant for HTN, DM, small adenomatous colon polyps (2016), chronic infarct right frontal lobe of brain.    Natalie Swanson was seen by [place] in the office in 2016 for positive fecal occult blood test. The  exam was unremarkable except for two small adenomas. We have not seen her since. Not felt likely to  need follow up colonoscopy due to age. We have not seen her since.    Patient brought by EMS to ED today for evaluation of left sided weakness, slurred speech. . Code Stroke initiated. She has a chronic R frontal infarct. Neurology feels her neurologic sx were secondary to transient ischemia related to severe anemia. Her symptoms localize to area of remote R MCA  We were asked to evaluate anemia.  In ED her hgb is 4.8. MCV is normal as are WBCs and platelets. FOBT is positive.  INR normal. She hasn't been  having any abdominal pain. No nausea. No bowel changes. No weight loss ( + gain). She endorses weakness over the last few weeks. She doesn't donate blood, no vaginal or urinary bleeding or epistaxis. Sometime in May she was started on Glucophage and recall have a dark stool x 1 shortly afterwards. She stopped glucophage, didn't like the way it made her feel though it didn't give her diarrhea.  She takes 1-2 chewable baby asa about once a week. She isn't on any acid blocker.    PREVIOUS ENDOSCOPIC EVALUATIONS / PERTINENT STUDIES   March 2016 colonoscopy for positive FIT  Complete exam, excellent prep. Two sessile polyps 3-7 mm in size were removed from descending and sigmoid.   Surgical [P], sigmoid and descending, polyp(2) - TUBULAR ADENOMAS. - NO HIGH GRADE DYSPLASIA OR MALIGNANCY IDENTIFIED.   Past Medical History:  Diagnosis Date   Abnormal liver function tests 04/03/2015   Atopic dermatitis 01/11/2012   Cataract 07-04-2006   b/l   Chicken pox as a child   DM type 2, goal A1c below 7 07/18/2012   Flashers or floaters, right eye 9-12   floaters   History of shingles    History of tobacco abuse 01/11/2012   Hx of adenomatous colonic polyps 11/07/2014   Hyperglycemia 07/18/2012   Hyperlipidemia    Hypertension    Measles as a child   Medicare annual wellness visit, subsequent 04/17/2011    G4P1 s/p 1 svd, no abnormal papas, menarche at 10 menopause at 82, regular    Mumps as a child   Pain in joint, ankle and foot 04/27/2014   right   Shingles 9-12   Vitamin D deficiency 09/14/2014    Past Surgical History:  Procedure Laterality Date   BREAST LUMPECTOMY WITH RADIOACTIVE SEED LOCALIZATION Left 11/16/2016   Procedure: LEFT BREAST LUMPECTOMY WITH RADIOACTIVE SEED LOCALIZATION;  Surgeon: Jovita Kussmaul, MD;  Location: [facility];  Service: General;  Laterality: Left;   CATARACT EXTRACTION W/ INTRAOCULAR LENS  IMPLANT, BILATERAL Bilateral 07-04-2006   CESAREAN SECTION  1981   X 1    Prior to Admission medications   Medication Sig Start Date End Date Taking? Authorizing Provider  Alcohol Swabs (B-D SINGLE USE SWABS REGULAR) PADS USE AS DIRECTED ONCE A DAY AND AS NEEDED Patient taking differently: 1 each by Other route See admin instructions. USE AS DIRECTED ONCE A DAY AND AS NEEDED 12/11/20  Yes Mosie Lukes, MD  aspirin EC 81 MG tablet Take 81 mg by mouth daily as needed (for headache relief.).   Yes [provider]  Blood Glucose Calibration (TRUE METRIX LEVEL 3) High SOLN Use as directed. Patient taking differently: 1 each by Other route See admin instructions. Use as directed. 07/30/20  Yes Mosie Lukes, MD  Blood Glucose Monitoring Suppl (TRUE METRIX METER) w/Device KIT USE AS DIRECTED ONE TIME DAILY  AND AS NEEDED Patient taking differently: 1 each by Other route See admin instructions. USE AS DIRECTED ONE TIME DAILY  AND AS NEEDED 11/04/20  Yes Mosie Lukes, MD  calamine lotion Apply 1 application topically 4 (four) times daily as needed (for itching/irritated skin.).   Yes [provider]  Cholecalciferol (VITAMIN D3) 5000 UNITS CAPS Take 5,000 Units by mouth 2 (two) times daily. MORNING & AFTERNOON   Yes [provider]  Omega-3 Fatty Acids (FISH OIL) 1200 MG CAPS Take 1,200 mg by mouth daily.   Yes [provider]  predniSONE (DELTASONE)  10 MG tablet Take  30 mg by mouth daily. 12/10/20  Yes [provider]  propranolol (INDERAL) 40 MG tablet TAKE 1 TABLET TWICE DAILY Patient taking differently: Take 40 mg by mouth 2 (two) times daily. 10/28/20  Yes Mosie Lukes, MD  simvastatin (ZOCOR) 40 MG tablet TAKE 1 TABLET AT BEDTIME Patient taking differently: Take 40 mg by mouth at bedtime. 10/26/20  Yes Mosie Lukes, MD  TRUE METRIX BLOOD GLUCOSE TEST test strip USE AS DIRECTED ONE TIME DAILY  AND AS NEEDED Patient taking differently: 1 each by Other route See admin instructions. USE AS DIRECTED ONE TIME DAILY  AND AS NEEDED 12/11/20  Yes Mosie Lukes, MD  TRUEplus Lancets 33G MISC USE TO CHECK SUGAR ONCE A DAY AND AS NEEDED. Patient taking differently: 1 each by Other route See admin instructions. USE TO CHECK SUGAR ONCE A DAY AND AS NEEDED. 12/11/20  Yes Mosie Lukes, MD  metFORMIN (GLUCOPHAGE) 500 MG tablet Take 1 tablet (500 mg total) by mouth daily with breakfast. Patient not taking: No sig reported 10/28/20   Mosie Lukes, MD    Current Facility-Administered Medications  Medication Dose Route Frequency Provider Last Rate Last Admin   0.9 %  sodium chloride infusion  10 mL/hr Intravenous Once Pattricia Boss, MD       sodium chloride flush (NS) 0.9 % injection 3 mL  3 mL Intravenous Once Pattricia Boss, MD       Current Outpatient Medications  Medication Sig Dispense Refill   Alcohol Swabs (B-D SINGLE USE SWABS REGULAR) PADS USE AS DIRECTED ONCE A DAY AND AS NEEDED (Patient taking differently: 1 each by Other route See admin instructions. USE AS DIRECTED ONCE A DAY AND AS NEEDED) 200 each 1   aspirin EC 81 MG tablet Take 81 mg by mouth daily as needed (for headache relief.).     Blood Glucose Calibration (TRUE METRIX LEVEL 3) High SOLN Use as directed. (Patient taking differently: 1 each by Other route See admin instructions. Use as directed.) 1 each 0   Blood Glucose Monitoring Suppl (TRUE METRIX METER) w/Device  KIT USE AS DIRECTED ONE TIME DAILY  AND AS NEEDED (Patient taking differently: 1 each by Other route See admin instructions. USE AS DIRECTED ONE TIME DAILY  AND AS NEEDED) 1 kit 0   calamine lotion Apply 1 application topically 4 (four) times daily as needed (for itching/irritated skin.).     Cholecalciferol (VITAMIN D3) 5000 UNITS CAPS Take 5,000 Units by mouth 2 (two) times daily. MORNING & AFTERNOON     Omega-3 Fatty Acids (FISH OIL) 1200 MG CAPS Take 1,200 mg by mouth daily.     predniSONE (DELTASONE) 10 MG tablet Take 30 mg by mouth daily.     propranolol (INDERAL) 40 MG tablet TAKE 1 TABLET TWICE DAILY (Patient taking differently: Take 40 mg by mouth 2 (two) times daily.) 180 tablet 2   simvastatin (ZOCOR) 40 MG tablet TAKE 1 TABLET AT BEDTIME (Patient taking differently: Take 40 mg by mouth at bedtime.) 90 tablet 2   TRUE METRIX BLOOD GLUCOSE TEST test strip USE AS DIRECTED ONE TIME DAILY  AND AS NEEDED (Patient taking differently: 1 each by Other route See admin instructions. USE AS DIRECTED ONE TIME DAILY  AND AS NEEDED) 200 strip 1   TRUEplus Lancets 33G MISC USE TO CHECK SUGAR ONCE A DAY AND AS NEEDED. (Patient taking differently: 1 each by Other route See admin instructions. USE TO CHECK SUGAR ONCE A DAY AND  AS NEEDED.) 200 each 1   metFORMIN (GLUCOPHAGE) 500 MG tablet Take 1 tablet (500 mg total) by mouth daily with breakfast. (Patient not taking: No sig reported) 90 tablet 3    Allergies as of 12/12/2020 - Review Complete 12/12/2020  Allergen Reaction Noted   Sulfa antibiotics Rash 04/06/2011    Family History  Problem Relation Age of Onset   Hyperlipidemia Mother    Hypertension Mother    Heart disease Mother        MI   Heart attack Father    Diabetes Father        type 2   Cancer Father        type unknown   Heart attack Brother    Heart disease Brother 93       MI   Hemophilia Son        x 2   Hypertension Son    Colon cancer Neg Hx    Stomach cancer Neg Hx      Social History   Socioeconomic History   Marital status: Widowed    Spouse name: Not on file   Number of children: 4   Years of education: Not on file   Highest education level: Not on file  Occupational History   Occupation: retired  Tobacco Use   Smoking status: Former    Packs/day: 0.20    Years: 61.00    Pack years: 12.20    Types: Cigarettes   Smokeless tobacco: Never   Tobacco comments:    uses e cigs ; quit smoking cigarettes 2013.  Substance and Sexual Activity   Alcohol use: No    Alcohol/week: 0.0 standard drinks   Drug use: No   Sexual activity: Yes  Other Topics Concern   Not on file  Social History Narrative   Not on file   Social Determinants of Health   Financial Resource Strain: Medium Risk   Difficulty of Paying Living Expenses: Somewhat hard  Food Insecurity: No Food Insecurity   Worried About Running Out of Food in the Last Year: Never true   Ran Out of Food in the Last Year: Never true  Transportation Needs: No Transportation Needs   Lack of Transportation (Medical): No   Lack of Transportation (Non-Medical): No  Physical Activity: Not on file  Stress: Not on file  Social Connections: Not on file  Intimate Partner Violence: Not on file    Review of Systems: All systems reviewed and negative except where noted in HPI.  OBJECTIVE:    Physical Exam: Vital signs in last 24 hours: Temp:  [98.1 F (36.7 C)-98.7 F (37.1 C)] 98.7 F (37.1 C) (06/11 1346) Pulse Rate:  [63-85] 85 (06/11 1415) Resp:  [18-25] 23 (06/11 1415) BP: (102-123)/(44-65) 114/44 (06/11 1415) SpO2:  [98 %-100 %] 99 % (06/11 1415) Weight:  [65.8 kg] 65.8 kg (06/11 1100)   General:   Alert  female in NAD Psych:  Pleasant, cooperative. Normal mood and affect. Eyes:  Pupils equal, sclera clear, no icterus.   Conjunctiva pink. Ears:  Normal auditory acuity. Nose:  No deformity, discharge,  or lesions. Neck:  Supple; no masses Lungs:  Clear throughout to  auscultation.   No wheezes, crackles, or rhonchi.  Heart:  Regular rate and rhythm;  no lower extremity edema Abdomen:  Soft, non-distended, nontender, BS active, no palp mass   Rectal:  Deferred  Msk:  Symmetrical without gross deformities. . Neurologic:  Alert and  oriented x4;. Skin:  Intact  without significant lesions or rashes.  Filed Weights   12/12/20 1100  Weight: 65.8 kg     Scheduled inpatient medications  sodium chloride flush  3 mL Intravenous Once      Intake/Output from previous day: No intake/output data recorded. Intake/Output this shift: No intake/output data recorded.   Lab Results: Recent Labs    12/12/20 1143 12/12/20 1147  WBC 10.2  --   HGB 4.8* 5.1*  HCT 16.4* 15.0*  PLT 294  --    BMET Recent Labs    12/12/20 1143 12/12/20 1147  NA 133* 134*  K 3.8 3.9  CL 104 101  CO2 20*  --   GLUCOSE 195* 190*  BUN 19 22  CREATININE 0.97 0.80  CALCIUM 9.1  --    LFT Recent Labs    12/12/20 1143  PROT 5.9*  ALBUMIN 3.2*  AST 22  ALT 13  ALKPHOS 49  BILITOT 0.6   PT/INR Recent Labs    12/12/20 1143  LABPROT 13.6  INR 1.0   Hepatitis Panel No results for input(s): HEPBSAG, HCVAB, HEPAIGM, HEPBIGM in the last 72 hours.   . CBC Latest Ref Rng & Units 12/12/2020 12/12/2020 12/02/2020  WBC 4.0 - 10.5 K/uL - 10.2 7.3  Hemoglobin 12.0 - 15.0 g/dL 5.1(LL) 4.8(LL) 11.1(L)  Hematocrit 36.0 - 46.0 % 15.0(L) 16.4(L) 34.6(L)  Platelets 150 - 400 K/uL - 294 229.0    . CMP Latest Ref Rng & Units 12/12/2020 12/12/2020 10/27/2020  Glucose 70 - 99 mg/dL 190(H) 195(H) 114(H)  BUN 8 - 23 mg/dL _0 Creatinine 0.44 - 1.00 mg/dL 0.80 0.97 0.83  Sodium 135 - 145 mmol/L 134(L) 133(L) 139  Potassium 3.5 - 5.1 mmol/L 3.9 3.8 4.6  Chloride 98 - 111 mmol/L 101 104 103  CO2 22 - 32 mmol/L - 20(L) 29  Calcium 8.9 - 10.3 mg/dL - 9.1 9.6  Total Protein 6.5 - 8.1 g/dL - 5.9(L) 7.1  Total Bilirubin 0.3 - 1.2 mg/dL - 0.6 0.5  Alkaline Phos 38 - 126 U/L -  49 79  AST 15 - 41 U/L - 22 25  ALT 0 - 44 U/L - 13 15   Studies/Results: CT HEAD CODE STROKE WO CONTRAST  Result Date: 12/12/2020 CLINICAL DATA:  Code stroke.  Slurred speech, left-sided weakness EXAM: CT HEAD WITHOUT CONTRAST TECHNIQUE: Contiguous axial images were obtained from the base of the skull through the vertex without intravenous contrast. COMPARISON:  2005 FINDINGS: Brain: There is no acute intracranial hemorrhage, mass effect, or edema. Chronic appearing right frontal infarction also involving the insula. There are small chronic infarcts of the right putamen. Additional patchy and confluent areas of hypoattenuation in the supratentorial white matter are nonspecific but probably reflect moderate chronic microvascular ischemic changes. Prominence of the ventricles and sulci reflects minor generalized parenchymal volume loss. There is no extra-axial collection. Vascular: No hyperdense vessel. There is intracranial atherosclerotic calcification at the skull base. Skull: Unremarkable. Sinuses/Orbits: No acute abnormality. Other: Nonspecific bilateral mastoid tip opacification. ASPECTS ([facility] Stroke Program Early CT Score) - Ganglionic level infarction (caudate, lentiform nuclei, internal capsule, insula, M1-M3 cortex): 7 - Supraganglionic infarction (M4-M6 cortex): 3 Total score (0-10 with 10 being normal): 10 IMPRESSION: There is no acute intracranial hemorrhage or evidence of acute infarction. ASPECT score is 10. 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 Small chronic infarcts of right putamen. Moderate chronic microvascular ischemic changes. Preliminary results were communicated to Dr. Quinn Axe at 11:54 am on 12/12/2020  by text page via the [facility] messaging system. Electronically Signed   By: Macy Mis M.D.   On: 12/12/2020 11:57    Active Problems:   Hypertension   Type 2 diabetes mellitus with hemoglobin A1c goal of less than 7.0% (HCC)   Vitamin D deficiency   Acute blood loss  anemia    Tye Savoy, NP-C @  12/12/2020, 3:07 PM

## 2020-12-12 NOTE — ED Triage Notes (Signed)
Pt brought from her neighbors house via EMS. Last known witnessed normal 0930. Pt reports that at 1025 she started experiencing left sided weakness, left leg numbness, dizziness, slurred and garbled speech, and blurred vision in the left eye. Pt was taken to her neighbors house and EMS called. EMS reports symptoms mostly resolved by the time of their arrival. Pt has minimal left facial droop upon arrival to ED. Code Stroke called and pt taken to CT 1. BGL 279 BP 102/80 HR 78 O2 97% RA 20g RAC

## 2020-12-12 NOTE — H&P (View-Only) (Signed)
Referring Provider:  Family Medicine         Primary Care Physician:  Mosie Lukes, MD Primary Gastroenterologist:   Silvano Rusk, MD           We were asked to see this patient for:   anemia               ASSESSMENT / PLAN:   # 85 yo female in ED with stoke like symptoms and severe heme + anemia. No overt GI bleeding. Neurology ruled her out for stroke ( symptoms felt to be secondary to transient ischemia related to anemia). --Hgb 4.8, down from 11 at PCP's office ten days ago.  Oddly enough patient has not noted any overt GI bleeding despite the 6 gram drop in Hgb.  --Transfusion of PRBCs in progress (1 of 2).  --Since CVA ruled out we can start with an EGD tomorrow (takes NSAIDS) The risks and benefits of EGD with possible biopsies was discussed with the patient and she agrees to proceed. If EGD negative then may need to repeat colonoscopy  --Not overtly bleeding. She got an 80 mg bolus of Protonix Will start with 40 mg IV qday in the am.  --am CBC     Attending Physician Note   I have taken a history, examined the patient and reviewed the chart. I agree with the Advanced Practitioner's note, impression and recommendations.  Severe anemia and heme + stool, presented with neurologic symptoms and they are resolving with transfusions, IVF. R/O ulcer, AVM, neoplasm, etc.   Transfusions to maintain Hgb > 7 Trend CBC Pantoprazole 40 mg IV qd EGD tomorrow If EGD is not diagnostic will proceed with colonoscopy on Monday  Lucio Edward, MD [facility] [phone]       HPI:                                                                                                                             Chief Complaint: anemia  Natalie Swanson is a 85 y.o. female with a past medical history significant for HTN, DM, small adenomatous colon polyps (2016), chronic infarct right frontal lobe of brain.    Natalie Swanson was seen by [place] in the office in 2016 for positive fecal occult blood test. The  exam was unremarkable except for two small adenomas. We have not seen her since. Not felt likely to  need follow up colonoscopy due to age. We have not seen her since.    Patient brought by EMS to ED today for evaluation of left sided weakness, slurred speech. . Code Stroke initiated. She has a chronic R frontal infarct. Neurology feels her neurologic sx were secondary to transient ischemia related to severe anemia. Her symptoms localize to area of remote R MCA  We were asked to evaluate anemia.  In ED her hgb is 4.8. MCV is normal as are WBCs and platelets. FOBT is positive.  INR normal. She hasn't been  having any abdominal pain. No nausea. No bowel changes. No weight loss ( + gain). She endorses weakness over the last few weeks. She doesn't donate blood, no vaginal or urinary bleeding or epistaxis. Sometime in May she was started on Glucophage and recall have a dark stool x 1 shortly afterwards. She stopped glucophage, didn't like the way it made her feel though it didn't give her diarrhea.  She takes 1-2 chewable baby asa about once a week. She isn't on any acid blocker.    PREVIOUS ENDOSCOPIC EVALUATIONS / PERTINENT STUDIES   March 2016 colonoscopy for positive FIT  Complete exam, excellent prep. Two sessile polyps 3-7 mm in size were removed from descending and sigmoid.   Surgical [P], sigmoid and descending, polyp(2) - TUBULAR ADENOMAS. - NO HIGH GRADE DYSPLASIA OR MALIGNANCY IDENTIFIED.   Past Medical History:  Diagnosis Date   Abnormal liver function tests 04/03/2015   Atopic dermatitis 01/11/2012   Cataract 07-04-2006   b/l   Chicken pox as a child   DM type 2, goal A1c below 7 07/18/2012   Flashers or floaters, right eye 9-12   floaters   History of shingles    History of tobacco abuse 01/11/2012   Hx of adenomatous colonic polyps 11/07/2014   Hyperglycemia 07/18/2012   Hyperlipidemia    Hypertension    Measles as a child   Medicare annual wellness visit, subsequent 04/17/2011    G4P1 s/p 1 svd, no abnormal papas, menarche at 10 menopause at 82, regular    Mumps as a child   Pain in joint, ankle and foot 04/27/2014   right   Shingles 9-12   Vitamin D deficiency 09/14/2014    Past Surgical History:  Procedure Laterality Date   BREAST LUMPECTOMY WITH RADIOACTIVE SEED LOCALIZATION Left 11/16/2016   Procedure: LEFT BREAST LUMPECTOMY WITH RADIOACTIVE SEED LOCALIZATION;  Surgeon: Jovita Kussmaul, MD;  Location: [facility];  Service: General;  Laterality: Left;   CATARACT EXTRACTION W/ INTRAOCULAR LENS  IMPLANT, BILATERAL Bilateral 07-04-2006   CESAREAN SECTION  1981   X 1    Prior to Admission medications   Medication Sig Start Date End Date Taking? Authorizing Provider  Alcohol Swabs (B-D SINGLE USE SWABS REGULAR) PADS USE AS DIRECTED ONCE A DAY AND AS NEEDED Patient taking differently: 1 each by Other route See admin instructions. USE AS DIRECTED ONCE A DAY AND AS NEEDED 12/11/20  Yes Mosie Lukes, MD  aspirin EC 81 MG tablet Take 81 mg by mouth daily as needed (for headache relief.).   Yes [provider]  Blood Glucose Calibration (TRUE METRIX LEVEL 3) High SOLN Use as directed. Patient taking differently: 1 each by Other route See admin instructions. Use as directed. 07/30/20  Yes Mosie Lukes, MD  Blood Glucose Monitoring Suppl (TRUE METRIX METER) w/Device KIT USE AS DIRECTED ONE TIME DAILY  AND AS NEEDED Patient taking differently: 1 each by Other route See admin instructions. USE AS DIRECTED ONE TIME DAILY  AND AS NEEDED 11/04/20  Yes Mosie Lukes, MD  calamine lotion Apply 1 application topically 4 (four) times daily as needed (for itching/irritated skin.).   Yes [provider]  Cholecalciferol (VITAMIN D3) 5000 UNITS CAPS Take 5,000 Units by mouth 2 (two) times daily. MORNING & AFTERNOON   Yes [provider]  Omega-3 Fatty Acids (FISH OIL) 1200 MG CAPS Take 1,200 mg by mouth daily.   Yes [provider]  predniSONE (DELTASONE)  10 MG tablet Take  30 mg by mouth daily. 12/10/20  Yes [provider]  propranolol (INDERAL) 40 MG tablet TAKE 1 TABLET TWICE DAILY Patient taking differently: Take 40 mg by mouth 2 (two) times daily. 10/28/20  Yes Mosie Lukes, MD  simvastatin (ZOCOR) 40 MG tablet TAKE 1 TABLET AT BEDTIME Patient taking differently: Take 40 mg by mouth at bedtime. 10/26/20  Yes Mosie Lukes, MD  TRUE METRIX BLOOD GLUCOSE TEST test strip USE AS DIRECTED ONE TIME DAILY  AND AS NEEDED Patient taking differently: 1 each by Other route See admin instructions. USE AS DIRECTED ONE TIME DAILY  AND AS NEEDED 12/11/20  Yes Mosie Lukes, MD  TRUEplus Lancets 33G MISC USE TO CHECK SUGAR ONCE A DAY AND AS NEEDED. Patient taking differently: 1 each by Other route See admin instructions. USE TO CHECK SUGAR ONCE A DAY AND AS NEEDED. 12/11/20  Yes Mosie Lukes, MD  metFORMIN (GLUCOPHAGE) 500 MG tablet Take 1 tablet (500 mg total) by mouth daily with breakfast. Patient not taking: No sig reported 10/28/20   Mosie Lukes, MD    Current Facility-Administered Medications  Medication Dose Route Frequency Provider Last Rate Last Admin   0.9 %  sodium chloride infusion  10 mL/hr Intravenous Once Pattricia Boss, MD       sodium chloride flush (NS) 0.9 % injection 3 mL  3 mL Intravenous Once Pattricia Boss, MD       Current Outpatient Medications  Medication Sig Dispense Refill   Alcohol Swabs (B-D SINGLE USE SWABS REGULAR) PADS USE AS DIRECTED ONCE A DAY AND AS NEEDED (Patient taking differently: 1 each by Other route See admin instructions. USE AS DIRECTED ONCE A DAY AND AS NEEDED) 200 each 1   aspirin EC 81 MG tablet Take 81 mg by mouth daily as needed (for headache relief.).     Blood Glucose Calibration (TRUE METRIX LEVEL 3) High SOLN Use as directed. (Patient taking differently: 1 each by Other route See admin instructions. Use as directed.) 1 each 0   Blood Glucose Monitoring Suppl (TRUE METRIX METER) w/Device  KIT USE AS DIRECTED ONE TIME DAILY  AND AS NEEDED (Patient taking differently: 1 each by Other route See admin instructions. USE AS DIRECTED ONE TIME DAILY  AND AS NEEDED) 1 kit 0   calamine lotion Apply 1 application topically 4 (four) times daily as needed (for itching/irritated skin.).     Cholecalciferol (VITAMIN D3) 5000 UNITS CAPS Take 5,000 Units by mouth 2 (two) times daily. MORNING & AFTERNOON     Omega-3 Fatty Acids (FISH OIL) 1200 MG CAPS Take 1,200 mg by mouth daily.     predniSONE (DELTASONE) 10 MG tablet Take 30 mg by mouth daily.     propranolol (INDERAL) 40 MG tablet TAKE 1 TABLET TWICE DAILY (Patient taking differently: Take 40 mg by mouth 2 (two) times daily.) 180 tablet 2   simvastatin (ZOCOR) 40 MG tablet TAKE 1 TABLET AT BEDTIME (Patient taking differently: Take 40 mg by mouth at bedtime.) 90 tablet 2   TRUE METRIX BLOOD GLUCOSE TEST test strip USE AS DIRECTED ONE TIME DAILY  AND AS NEEDED (Patient taking differently: 1 each by Other route See admin instructions. USE AS DIRECTED ONE TIME DAILY  AND AS NEEDED) 200 strip 1   TRUEplus Lancets 33G MISC USE TO CHECK SUGAR ONCE A DAY AND AS NEEDED. (Patient taking differently: 1 each by Other route See admin instructions. USE TO CHECK SUGAR ONCE A DAY AND  AS NEEDED.) 200 each 1   metFORMIN (GLUCOPHAGE) 500 MG tablet Take 1 tablet (500 mg total) by mouth daily with breakfast. (Patient not taking: No sig reported) 90 tablet 3    Allergies as of 12/12/2020 - Review Complete 12/12/2020  Allergen Reaction Noted   Sulfa antibiotics Rash 04/06/2011    Family History  Problem Relation Age of Onset   Hyperlipidemia Mother    Hypertension Mother    Heart disease Mother        MI   Heart attack Father    Diabetes Father        type 2   Cancer Father        type unknown   Heart attack Brother    Heart disease Brother 93       MI   Hemophilia Son        x 2   Hypertension Son    Colon cancer Neg Hx    Stomach cancer Neg Hx      Social History   Socioeconomic History   Marital status: Widowed    Spouse name: Not on file   Number of children: 4   Years of education: Not on file   Highest education level: Not on file  Occupational History   Occupation: retired  Tobacco Use   Smoking status: Former    Packs/day: 0.20    Years: 61.00    Pack years: 12.20    Types: Cigarettes   Smokeless tobacco: Never   Tobacco comments:    uses e cigs ; quit smoking cigarettes 2013.  Substance and Sexual Activity   Alcohol use: No    Alcohol/week: 0.0 standard drinks   Drug use: No   Sexual activity: Yes  Other Topics Concern   Not on file  Social History Narrative   Not on file   Social Determinants of Health   Financial Resource Strain: Medium Risk   Difficulty of Paying Living Expenses: Somewhat hard  Food Insecurity: No Food Insecurity   Worried About Running Out of Food in the Last Year: Never true   Ran Out of Food in the Last Year: Never true  Transportation Needs: No Transportation Needs   Lack of Transportation (Medical): No   Lack of Transportation (Non-Medical): No  Physical Activity: Not on file  Stress: Not on file  Social Connections: Not on file  Intimate Partner Violence: Not on file    Review of Systems: All systems reviewed and negative except where noted in HPI.  OBJECTIVE:    Physical Exam: Vital signs in last 24 hours: Temp:  [98.1 F (36.7 C)-98.7 F (37.1 C)] 98.7 F (37.1 C) (06/11 1346) Pulse Rate:  [63-85] 85 (06/11 1415) Resp:  [18-25] 23 (06/11 1415) BP: (102-123)/(44-65) 114/44 (06/11 1415) SpO2:  [98 %-100 %] 99 % (06/11 1415) Weight:  [65.8 kg] 65.8 kg (06/11 1100)   General:   Alert  female in NAD Psych:  Pleasant, cooperative. Normal mood and affect. Eyes:  Pupils equal, sclera clear, no icterus.   Conjunctiva pink. Ears:  Normal auditory acuity. Nose:  No deformity, discharge,  or lesions. Neck:  Supple; no masses Lungs:  Clear throughout to  auscultation.   No wheezes, crackles, or rhonchi.  Heart:  Regular rate and rhythm;  no lower extremity edema Abdomen:  Soft, non-distended, nontender, BS active, no palp mass   Rectal:  Deferred  Msk:  Symmetrical without gross deformities. . Neurologic:  Alert and  oriented x4;. Skin:  Intact  without significant lesions or rashes.  Filed Weights   12/12/20 1100  Weight: 65.8 kg     Scheduled inpatient medications  sodium chloride flush  3 mL Intravenous Once      Intake/Output from previous day: No intake/output data recorded. Intake/Output this shift: No intake/output data recorded.   Lab Results: Recent Labs    12/12/20 1143 12/12/20 1147  WBC 10.2  --   HGB 4.8* 5.1*  HCT 16.4* 15.0*  PLT 294  --    BMET Recent Labs    12/12/20 1143 12/12/20 1147  NA 133* 134*  K 3.8 3.9  CL 104 101  CO2 20*  --   GLUCOSE 195* 190*  BUN 19 22  CREATININE 0.97 0.80  CALCIUM 9.1  --    LFT Recent Labs    12/12/20 1143  PROT 5.9*  ALBUMIN 3.2*  AST 22  ALT 13  ALKPHOS 49  BILITOT 0.6   PT/INR Recent Labs    12/12/20 1143  LABPROT 13.6  INR 1.0   Hepatitis Panel No results for input(s): HEPBSAG, HCVAB, HEPAIGM, HEPBIGM in the last 72 hours.   . CBC Latest Ref Rng & Units 12/12/2020 12/12/2020 12/02/2020  WBC 4.0 - 10.5 K/uL - 10.2 7.3  Hemoglobin 12.0 - 15.0 g/dL 5.1(LL) 4.8(LL) 11.1(L)  Hematocrit 36.0 - 46.0 % 15.0(L) 16.4(L) 34.6(L)  Platelets 150 - 400 K/uL - 294 229.0    . CMP Latest Ref Rng & Units 12/12/2020 12/12/2020 10/27/2020  Glucose 70 - 99 mg/dL 190(H) 195(H) 114(H)  BUN 8 - 23 mg/dL _0 Creatinine 0.44 - 1.00 mg/dL 0.80 0.97 0.83  Sodium 135 - 145 mmol/L 134(L) 133(L) 139  Potassium 3.5 - 5.1 mmol/L 3.9 3.8 4.6  Chloride 98 - 111 mmol/L 101 104 103  CO2 22 - 32 mmol/L - 20(L) 29  Calcium 8.9 - 10.3 mg/dL - 9.1 9.6  Total Protein 6.5 - 8.1 g/dL - 5.9(L) 7.1  Total Bilirubin 0.3 - 1.2 mg/dL - 0.6 0.5  Alkaline Phos 38 - 126 U/L -  49 79  AST 15 - 41 U/L - 22 25  ALT 0 - 44 U/L - 13 15   Studies/Results: CT HEAD CODE STROKE WO CONTRAST  Result Date: 12/12/2020 CLINICAL DATA:  Code stroke.  Slurred speech, left-sided weakness EXAM: CT HEAD WITHOUT CONTRAST TECHNIQUE: Contiguous axial images were obtained from the base of the skull through the vertex without intravenous contrast. COMPARISON:  2005 FINDINGS: Brain: There is no acute intracranial hemorrhage, mass effect, or edema. Chronic appearing right frontal infarction also involving the insula. There are small chronic infarcts of the right putamen. Additional patchy and confluent areas of hypoattenuation in the supratentorial white matter are nonspecific but probably reflect moderate chronic microvascular ischemic changes. Prominence of the ventricles and sulci reflects minor generalized parenchymal volume loss. There is no extra-axial collection. Vascular: No hyperdense vessel. There is intracranial atherosclerotic calcification at the skull base. Skull: Unremarkable. Sinuses/Orbits: No acute abnormality. Other: Nonspecific bilateral mastoid tip opacification. ASPECTS ([facility]er Stroke Program Early CT Score) - Ganglionic level infarction (caudate, lentiform nuclei, internal capsule, insula, M1-M3 cortex): 7 - Supraganglionic infarction (M4-M6 cortex): 3 Total score (0-10 with 10 being normal): 10 IMPRESSION: There is no acute intracranial hemorrhage or evidence of acute infarction. ASPECT score is 10. 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 Small chronic infarcts of right putamen. Moderate chronic microvascular ischemic changes. Preliminary results were communicated to Dr. Quinn Axe at 11:54 am on 12/12/2020  by text page via the [facility] messaging system. Electronically Signed   By: Macy Mis M.D.   On: 12/12/2020 11:57    Active Problems:   Hypertension   Type 2 diabetes mellitus with hemoglobin A1c goal of less than 7.0% (HCC)   Vitamin D deficiency   Acute blood loss  anemia    Tye Savoy, NP-C @  12/12/2020, 3:07 PM

## 2020-12-12 NOTE — H&P (Signed)
History and Physical    Natalie Swanson NOB:[id number] DOB: [birth year] DOA: 12/12/2020  PCP: Natalie Lukes, MD Consultants:  none Patient coming from:  Home - lives alone   Chief Complaint: slurred speech and left sided numbness.   HPI: Natalie Swanson is a 85 y.o. female with medical history significant of HTN, HLD, type 2 diabetes who came in to ED for concerns for stroke.  She had left sided numbness and slurred speech around 10:20 this AM. She had called her daughter and then her son called EMS. She has no known history of prior CVA. Her slurred speech is nearly resolved and she no longer has tingling on left side. She denies any left sided weakness, word finding difficulty or confused.  She states she has been more fatigued than normal. No shortness of breath, no black colored stools. Denies any weight loss, nausea or vomiting. No easy bruising or bleeding. She  has normal appetite. Does take NSAIDs.   Last colonoscopy: 11/10/2014 for heme + stool. 2 polyps removed, otherwise normal. Saw Dr. Carlean Purl. No family hx of colon cancer in first degree relatives. Never has had an EGD.  ED Course: Vitals on arrival blood pressure 123/60, heart rate 74, respiratory rate 18, oxygen 99% on room air.  Code stroke was initially called due to patient's symptoms of slurred speech and left-sided weakness.  CT head showed no acute intracranial hemorrhage or evidence of acute infarction. was found to have a hemoglobin of 4.8.  Neurology consulted and felt transient neurological symptoms attributed to transient ischemia secondary to severe symptomatic anemia.  Fecal occult positive.  transfusion started.  IV protonix given as well. Was asked to admit for acute blood loss anemia likely secondary to GI bleed  Review of Systems: As per HPI; otherwise review of systems reviewed and negative.   Ambulatory Status:  Ambulates without assistance   Past Medical History:  Diagnosis Date   Abnormal liver function  tests 04/03/2015   Atopic dermatitis 01/11/2012   Cataract 07-04-2006   b/l   Chicken pox as a child   DM type 2, goal A1c below 7 07/18/2012   Flashers or floaters, right eye 9-12   floaters   History of shingles    History of tobacco abuse 01/11/2012   Hx of adenomatous colonic polyps 11/07/2014   Hyperglycemia 07/18/2012   Hyperlipidemia    Hypertension    Measles as a child   Medicare annual wellness visit, subsequent 04/17/2011   G4P1 s/p 1 svd, no abnormal papas, menarche at 55 menopause at 20, regular    Mumps as a child   Pain in joint, ankle and foot 04/27/2014   right   Shingles 9-12   Vitamin D deficiency 09/14/2014    Past Surgical History:  Procedure Laterality Date   BREAST LUMPECTOMY WITH RADIOACTIVE SEED LOCALIZATION Left 11/16/2016   Procedure: LEFT BREAST LUMPECTOMY WITH RADIOACTIVE SEED LOCALIZATION;  Surgeon: Jovita Kussmaul, MD;  Location: [facility];  Service: General;  Laterality: Left;   CATARACT EXTRACTION W/ INTRAOCULAR LENS  IMPLANT, BILATERAL Bilateral 07-04-2006   CESAREAN SECTION  1981   X 1    Social History   Socioeconomic History   Marital status: Widowed    Spouse name: Not on file   Number of children: 4   Years of education: Not on file   Highest education level: Not on file  Occupational History   Occupation: retired  Tobacco Use   Smoking status: Former  Packs/day: 0.20    Years: 61.00    Pack years: 12.20    Types: Cigarettes   Smokeless tobacco: Never   Tobacco comments:    uses e cigs ; quit smoking cigarettes 2013.  Substance and Sexual Activity   Alcohol use: No    Alcohol/week: 0.0 standard drinks   Drug use: No   Sexual activity: Yes  Other Topics Concern   Not on file  Social History Narrative   Not on file   Social Determinants of Health   Financial Resource Strain: Medium Risk   Difficulty of Paying Living Expenses: Somewhat hard  Food Insecurity: No Food Insecurity   Worried About Running Out of Food in the Last Year:  Never true   Ran Out of Food in the Last Year: Never true  Transportation Needs: No Transportation Needs   Lack of Transportation (Medical): No   Lack of Transportation (Non-Medical): No  Physical Activity: Not on file  Stress: Not on file  Social Connections: Not on file  Intimate Partner Violence: Not on file    Allergies  Allergen Reactions   Sulfa Antibiotics Rash    Family History  Problem Relation Age of Onset   Hyperlipidemia Mother    Hypertension Mother    Heart disease Mother        MI   Heart attack Father    Diabetes Father        type 2   Cancer Father        type unknown   Heart attack Brother    Heart disease Brother 78       MI   Hemophilia Son        x 2   Hypertension Son    Colon cancer Neg Hx    Stomach cancer Neg Hx     Prior to Admission medications   Medication Sig Start Date End Date Taking? Authorizing Provider  Alcohol Swabs (B-D SINGLE USE SWABS REGULAR) PADS USE AS DIRECTED ONCE A DAY AND AS NEEDED 12/11/20   Natalie Lukes, MD  aspirin EC 81 MG tablet Take 81 mg by mouth daily as needed (for headache relief.).    [provider]  Blood Glucose Calibration (TRUE METRIX LEVEL 3) High SOLN Use as directed. 07/30/20   Natalie Lukes, MD  Blood Glucose Monitoring Suppl (TRUE METRIX METER) w/Device KIT USE AS DIRECTED ONE TIME DAILY  AND AS NEEDED 11/04/20   Natalie Lukes, MD  calamine lotion Apply 1 application topically 4 (four) times daily as needed (for itching/irritated skin.).    [provider]  Cholecalciferol (VITAMIN D3) 5000 UNITS CAPS Take 5,000 Units by mouth 2 (two) times daily. MORNING & AFTERNOON    [provider]  metFORMIN (GLUCOPHAGE) 500 MG tablet Take 1 tablet (500 mg total) by mouth daily with breakfast. 10/28/20   Natalie Lukes, MD  Omega-3 Fatty Acids (FISH OIL) 1200 MG CAPS Take 1,200 mg by mouth daily.    [provider]  propranolol (INDERAL) 40 MG tablet TAKE 1 TABLET TWICE DAILY  10/28/20   Natalie Lukes, MD  simvastatin (ZOCOR) 40 MG tablet TAKE 1 TABLET AT BEDTIME 10/26/20   Natalie Lukes, MD  TRUE METRIX BLOOD GLUCOSE TEST test strip USE AS DIRECTED ONE TIME DAILY  AND AS NEEDED 12/11/20   Natalie Lukes, MD  TRUEplus Lancets 33G MISC USE TO CHECK SUGAR ONCE A DAY AND AS NEEDED. 12/11/20   Natalie Lukes, MD  Physical Exam: Vitals:   12/12/20 1652 12/12/20 1925 12/12/20 1934 12/12/20 1945  BP: (!) 144/67 (!) 117/49 116/62 116/62  Pulse: 67 (!) 56 70 (!) 58  Resp: _0 Temp: 97.7 F (36.5 C) (!) 97.5 F (36.4 C) 98 F (36.7 C) 98 F (36.7 C)  TempSrc: Oral Axillary  Oral  SpO2: 99% 97%  98%  Weight:         General:  Appears calm and comfortable and is in NAD. Pallor  Eyes:  PERRL, EOMI, normal lids, iris.  ENT:  grossly normal hearing, lips & tongue, mmm; appropriate dentition Neck:  no LAD, masses or thyromegaly; no carotid bruits Cardiovascular:  RRR, no m/r/g. No LE edema.  Respiratory:   CTA bilaterally with no wheezes/rales/rhonchi.  Normal respiratory effort. Abdomen:  soft, NT, ND, NABS Back:   normal alignment, no CVAT Skin:  no rash or induration seen on limited exam Musculoskeletal:  grossly normal tone BUE/BLE, good ROM, no bony abnormality Lower extremity:  Limited foot exam with no ulcerations.  2+ distal pulses. Psychiatric:  grossly normal mood and affect, speech fluent and appropriate, AOx3 Neurologic:  CN 2-12 grossly intact, moves all extremities in coordinated fashion, sensation intact    Radiological Exams on Admission: Independently reviewed - see discussion in A/P where applicable  CT HEAD CODE STROKE WO CONTRAST  Result Date: 12/12/2020 CLINICAL DATA:  Code stroke.  Slurred speech, left-sided weakness EXAM: CT HEAD WITHOUT CONTRAST TECHNIQUE: Contiguous axial images were obtained from the base of the skull through the vertex without intravenous contrast. COMPARISON:  2005 FINDINGS: Brain: There is no acute  intracranial hemorrhage, mass effect, or edema. Chronic appearing right frontal infarction also involving the insula. There are small chronic infarcts of the right putamen. Additional patchy and confluent areas of hypoattenuation in the supratentorial white matter are nonspecific but probably reflect moderate chronic microvascular ischemic changes. Prominence of the ventricles and sulci reflects minor generalized parenchymal volume loss. There is no extra-axial collection. Vascular: No hyperdense vessel. There is intracranial atherosclerotic calcification at the skull base. Skull: Unremarkable. Sinuses/Orbits: No acute abnormality. Other: Nonspecific bilateral mastoid tip opacification. ASPECTS ([place] Stroke Program Early CT Score) - Ganglionic level infarction (caudate, lentiform nuclei, internal capsule, insula, M1-M3 cortex): 7 - Supraganglionic infarction (M4-M6 cortex): 3 Total score (0-10 with 10 being normal): 10 IMPRESSION: There is no acute intracranial hemorrhage or evidence of acute infarction. ASPECT score is 10. 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 Small chronic infarcts of right putamen. Moderate chronic microvascular ischemic changes. Preliminary results were communicated to Dr. Quinn Axe at 11:54 am on 12/12/2020 by text page via the [facility] messaging system. Electronically Signed   By: Macy Mis M.D.   On: 12/12/2020 11:57    EKG: Independently reviewed.  NSR with rate 71; nonspecific ST changes with no evidence of acute ischemia   Labs on Admission: I have personally reviewed the available labs and imaging studies at the time of the admission.  Pertinent labs:  Fecal occult: positive H&H: 4.8/16.4 Glucose: 195 Sodium: 133 Co2: 20 O+ blood    Assessment/Plan Active Problems:   Acute blood loss anemia -likely secondary to GI bleed. 11 days ago hgb was 11.1--->4.8 -GI consulted. Liquid diet then NPO after midnight -continue protonix -hold anticoagulation -2 units  PRBC. Repeat H&H and trend q 4hours.     GI bleed -GI consulted and will see in AM. Stable -transfuse, protonix, serial H&H  Left sided weakness/slurred speech -neurology consulted  and stroke code called. CT negative -Transient neurologic symptoms attributed to transient ischemia secondary  to severe symptomatic anemia. Her sx localize to area of remote R MCA infarct. Unaware she has ever had a stroke.  -restart ASA when safe to do so per neurology recommendation for secondary stroke prevention.      Hypertension -continue home medication of propranolol -has bee normotensive.     Type 2 diabetes mellitus with hemoglobin A1c goal of less than 7.0% (HCC) -hold metformin, SSI -A1c 7.3 in 10/2020 -will not repeat with transfusion    Body mass index is 29.3 kg/m.   Level of care: Med-Surg DVT prophylaxis:  SCDs Code Status:  Full - confirmed with patient.  Family Communication: daughter present at bedside Dayna Barker [phone] Disposition Plan:  The patient is from: home   Patient is currently: acutely ill Consults called: Neurology consulted in ER and GI: Dr. Fuller Plan (talked to PA)  Admission status:  inpatient    Orma Flaming MD [facility]   How to contact the [facility] Attending or Consulting provider [place] or covering provider during after hours [place], for this patient?  Check the care team in [facility] and look for a) attending/consulting [facility] provider listed and b) the [facility] team listed Log into [email or web address] and use [place] universal password to access. If you do not have the password, please contact the hospital operator. Locate the [facility] provider you are looking for under [facility] and page to a number that you can be directly reached. If you still have difficulty reaching the provider, please page the [facility] (Director on Call) for the Hospitalists listed on amion for assistance.   12/12/2020, 9:22 PM

## 2020-12-12 NOTE — ED Provider Notes (Signed)
[facility] EMERGENCY DEPARTMENT Provider Note   CSN: [id number] Arrival date & time: 12/12/20  1137  An emergency department physician performed an initial assessment on this suspected stroke patient at 1142.  History Chief Complaint  Patient presents with   Code Stroke    Natalie Swanson is a 85 y.o. female.  HPI 85 year old female history of hypertension, hyperlipidemia, diabetes, anemia, last hemoglobin 6 1 was 11.1 presents today as code stroke.  She reports that this morning she began having right arm paresthesias.  Per triage note, last known normal 930.  Patient reports symptoms at 1025 with left-sided weakness, left leg numbness, dizziness slurred and garbled speech and blurred vision of the left eye.  EMS was called.  They initiated code stroke on arrival to the ED.  Patient was taken to CT and returned room prior to my evaluation.  Stroke team has been involved. Patient states that she lives alone.  She has had some increased weakness at her daughter ordered a walker for her last night.  Her daughter is present here in the room at the bedside. Review of office notes revealed that she has had some anemia. She denies any hematemesis or rectal bleeding.  She endorses that she has been seen by GI and had a colonoscopy approximately 5 years ago that was negative.  She does not think that she has followed with GI for anything regularly.  Review of records revealed that she had colonoscopy by Dr. Arelia Longest in 2016 secondary to heme positive stools.  Review of colonoscopy reveals that she had 2 polyps ranging from 3 to 7 mm in the descending colon and the exam was otherwise normal.  Polypectomies were performed.  Past Medical History:  Diagnosis Date   Abnormal liver function tests 04/03/2015   Atopic dermatitis 01/11/2012   Cataract 07-04-2006   b/l   Chicken pox as a child   DM type 2, goal A1c below 7 07/18/2012   Flashers or floaters, right eye 9-12   floaters    History of shingles    History of tobacco abuse 01/11/2012   Hx of adenomatous colonic polyps 11/07/2014   Hyperglycemia 07/18/2012   Hyperlipidemia    Hypertension    Measles as a child   Medicare annual wellness visit, subsequent 04/17/2011   G4P1 s/p 1 svd, no abnormal papas, menarche at 69 menopause at 58, regular    Mumps as a child   Pain in joint, ankle and foot 04/27/2014   right   Shingles 9-12   Vitamin D deficiency 09/14/2014    Patient Active Problem List   Diagnosis Date Noted   Atrophic vaginitis 06/13/2017   Abnormal liver function tests 04/03/2015   Hx of adenomatous colonic polyps 11/07/2014   Vitamin D deficiency 09/14/2014   Pain in joint, ankle and foot 04/27/2014   Type 2 diabetes mellitus with hemoglobin A1c goal of less than 7.0% ([place]) 07/18/2012   Atopic dermatitis 01/11/2012   History of tobacco abuse 01/11/2012   History of pyelonephritis 04/17/2011   Preventative health care 04/17/2011   Hypertension    Hyperlipidemia    History of shingles    Mumps    Chicken pox    Measles    Flashers or floaters, right eye    Cataract 07/04/2006    Past Surgical History:  Procedure Laterality Date   BREAST LUMPECTOMY WITH RADIOACTIVE SEED LOCALIZATION Left 11/16/2016   Procedure: LEFT BREAST LUMPECTOMY WITH RADIOACTIVE SEED LOCALIZATION;  Surgeon: Autumn Messing  III, MD;  Location: [facility] OR;  Service: General;  Laterality: Left;   CATARACT EXTRACTION W/ INTRAOCULAR LENS  IMPLANT, BILATERAL Bilateral 07-04-2006   CESAREAN SECTION  1981   X 1     OB History   No obstetric history on file.     Family History  Problem Relation Age of Onset   Hyperlipidemia Mother    Hypertension Mother    Heart disease Mother        MI   Heart attack Father    Diabetes Father        type 2   Cancer Father        type unknown   Heart attack Brother    Heart disease Brother 59       MI   Hemophilia Son        x 2   Hypertension Son    Colon cancer Neg Hx    Stomach cancer  Neg Hx     Social History   Tobacco Use   Smoking status: Former    Packs/day: 0.20    Years: 61.00    Pack years: 12.20    Types: Cigarettes   Smokeless tobacco: Never   Tobacco comments:    uses e cigs ; quit smoking cigarettes 2013.  Substance Use Topics   Alcohol use: No    Alcohol/week: 0.0 standard drinks   Drug use: No    Home Medications Prior to Admission medications   Medication Sig Start Date End Date Taking? Authorizing Provider  Alcohol Swabs (B-D SINGLE USE SWABS REGULAR) PADS USE AS DIRECTED ONCE A DAY AND AS NEEDED 12/11/20   Blyth, Stacey A, MD  aspirin EC 81 MG tablet Take 81 mg by mouth daily as needed (for headache relief.).    [provider]  Blood Glucose Calibration (TRUE METRIX LEVEL 3) High SOLN Use as directed. 07/30/20   Blyth, Stacey A, MD  Blood Glucose Monitoring Suppl (TRUE METRIX METER) w/Device KIT USE AS DIRECTED ONE TIME DAILY  AND AS NEEDED 11/04/20   Blyth, Stacey A, MD  calamine lotion Apply 1 application topically 4 (four) times daily as needed (for itching/irritated skin.).    [provider]  Cholecalciferol (VITAMIN D3) 5000 UNITS CAPS Take 5,000 Units by mouth 2 (two) times daily. MORNING & AFTERNOON    [provider]  metFORMIN (GLUCOPHAGE) 500 MG tablet Take 1 tablet (500 mg total) by mouth daily with breakfast. 10/28/20   Blyth, Stacey A, MD  Omega-3 Fatty Acids (FISH OIL) 1200 MG CAPS Take 1,200 mg by mouth daily.    [provider]  propranolol (INDERAL) 40 MG tablet TAKE 1 TABLET TWICE DAILY 10/28/20   Blyth, Stacey A, MD  simvastatin (ZOCOR) 40 MG tablet TAKE 1 TABLET AT BEDTIME 10/26/20   Blyth, Stacey A, MD  TRUE METRIX BLOOD GLUCOSE TEST test strip USE AS DIRECTED ONE TIME DAILY  AND AS NEEDED 12/11/20   Blyth, Stacey A, MD  TRUEplus Lancets 33G MISC USE TO CHECK SUGAR ONCE A DAY AND AS NEEDED. 12/11/20   Blyth, Stacey A, MD    Allergies    Sulfa antibiotics  Review of Systems   Review of  Systems  All other systems reviewed and are negative.  Physical Exam Updated Vital Signs BP 102/65   Pulse 68   Temp 98.1 F (36.7 C) (Oral)   Resp 19   Wt 65.8 kg   SpO2 100%   BMI 29.30 kg/m   Physical   Exam Vitals and nursing note reviewed.  Constitutional:      Appearance: Normal appearance.  HENT:     Head: Normocephalic.     Right Ear: External ear normal.     Left Ear: External ear normal.     Nose: Nose normal.     Mouth/Throat:     Pharynx: Oropharynx is clear.  Eyes:     Pupils: Pupils are equal, round, and reactive to light.     Comments: Conjunctive a pale  Cardiovascular:     Rate and Rhythm: Normal rate and regular rhythm.     Pulses: Normal pulses.  Pulmonary:     Effort: Pulmonary effort is normal.  Abdominal:     General: Abdomen is flat. Bowel sounds are normal. There is no distension.     Palpations: Abdomen is soft. There is no mass.     Tenderness: There is no abdominal tenderness.  Genitourinary:    Rectum: Normal. Guaiac result positive.  Musculoskeletal:        General: Normal range of motion.     Cervical back: Normal range of motion.  Skin:    General: Skin is warm and dry.     Capillary Refill: Capillary refill takes less than 2 seconds.     Coloration: Skin is pale.  Neurological:     Mental Status: She is alert.     Cranial Nerves: Cranial nerves are intact.     Sensory: Sensation is intact.     Motor: Motor function is intact.     Coordination: Coordination is intact.     Deep Tendon Reflexes: Reflexes normal.    ED Results / Procedures / Treatments   Labs (all labs ordered are listed, but only abnormal results are displayed) Labs Reviewed  APTT - Abnormal; Notable for the following components:      Result Value   aPTT 23 (*)    All other components within normal limits  CBC - Abnormal; Notable for the following components:   RBC 1.83 (*)    Hemoglobin 4.8 (*)    HCT 16.4 (*)    MCHC 29.3 (*)    RDW 18.2 (*)    All  other components within normal limits  COMPREHENSIVE METABOLIC PANEL - Abnormal; Notable for the following components:   Sodium 133 (*)    CO2 20 (*)    Glucose, Bld 195 (*)    Total Protein 5.9 (*)    Albumin 3.2 (*)    GFR, Estimated 58 (*)    All other components within normal limits  I-STAT CHEM 8, ED - Abnormal; Notable for the following components:   Sodium 134 (*)    Glucose, Bld 190 (*)    TCO2 21 (*)    Hemoglobin 5.1 (*)    HCT 15.0 (*)    All other components within normal limits  POC OCCULT BLOOD, ED - Abnormal; Notable for the following components:   Fecal Occult Bld POSITIVE (*)    All other components within normal limits  URINE CULTURE  PROTIME-INR  DIFFERENTIAL  LDL CHOLESTEROL, DIRECT  HEMOGLOBIN A1C  URINALYSIS, ROUTINE W REFLEX MICROSCOPIC  CBG MONITORING, ED  TYPE AND SCREEN  ABO/RH    EKG None  Radiology CT HEAD CODE STROKE WO CONTRAST  Result Date: 12/12/2020 CLINICAL DATA:  Code stroke.  Slurred speech, left-sided weakness EXAM: CT HEAD WITHOUT CONTRAST TECHNIQUE: Contiguous axial images were obtained from the base of the skull through the vertex without intravenous contrast. COMPARISON:  2005 FINDINGS: Brain: There is no acute intracranial hemorrhage, mass effect, or edema. Chronic appearing right frontal infarction also involving the insula. There are small chronic infarcts of the right putamen. Additional patchy and confluent areas of hypoattenuation in the supratentorial white matter are nonspecific but probably reflect moderate chronic microvascular ischemic changes. Prominence of the ventricles and sulci reflects minor generalized parenchymal volume loss. There is no extra-axial collection. Vascular: No hyperdense vessel. There is intracranial atherosclerotic calcification at the skull base. Skull: Unremarkable. Sinuses/Orbits: No acute abnormality. Other: Nonspecific bilateral mastoid tip opacification. ASPECTS ([place] Stroke Program Early CT Score)  - Ganglionic level infarction (caudate, lentiform nuclei, internal capsule, insula, M1-M3 cortex): 7 - Supraganglionic infarction (M4-M6 cortex): 3 Total score (0-10 with 10 being normal): 10 IMPRESSION: There is no acute intracranial hemorrhage or evidence of acute infarction. ASPECT score is 10. 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 Small chronic infarcts of right putamen. Moderate chronic microvascular ischemic changes. Preliminary results were communicated to Dr. Quinn Axe at 11:54 am on 12/12/2020 by text page via the [facility] messaging system. Electronically Signed   By: Macy Mis M.D.   On: 12/12/2020 11:57    Procedures .Critical Care  Date/Time: 12/12/2020 1:09 PM Performed by: Pattricia Boss, MD Authorized by: Pattricia Boss, MD   Critical care provider statement:    Critical care time (minutes):  38   Critical care was necessary to treat or prevent imminent or life-threatening deterioration of the following conditions:  CNS failure or compromise and circulatory failure   Critical care was time spent personally by me on the following activities:  Discussions with consultants, evaluation of patient's response to treatment, examination of patient, ordering and performing treatments and interventions, ordering and review of laboratory studies, ordering and review of radiographic studies, pulse oximetry, re-evaluation of patient's condition, obtaining history from patient or surrogate and review of old charts   Medications Ordered in ED Medications  sodium chloride flush (NS) 0.9 % injection 3 mL (has no administration in time range)  aspirin chewable tablet 81 mg (has no administration in time range)    Or  aspirin suppository 300 mg (has no administration in time range)  clopidogrel (PLAVIX) tablet 75 mg (has no administration in time range)    ED Course  I have reviewed the triage vital signs and the nursing notes.  Pertinent labs & imaging results that were available during  my care of the patient were reviewed by me and considered in my medical decision making (see chart for details).    MDM Rules/Calculators/A&P                         Seen and evaluated as code stroke by neurology.  Patient with chronic infarct of right frontal lobe and insula.  Dr. Quinn Axe feels that all of her symptoms today are consistent with her prior stroke and this is likely secondary to low flow. Patient with significant anemia and GI bleeding here in ED. 2 units of packed red blood cells have been ordered. Will consult GI and admitting hospitalist for admission for further evaluation and treatment. Protonix ordered.  Appears more consistent with upper than lower GI bleeding. Discussed with [facility] see in consultation Discussed with Dr. Eliberto Ivory, on-call for hospitalist and she will see for admission  Final Clinical Impression(s) / ED Diagnoses Final diagnoses:  Gastrointestinal hemorrhage, unspecified gastrointestinal hemorrhage type  Severe anemia    Rx / DC Orders ED Discharge Orders  None        , , MD 12/12/20 1328  

## 2020-12-12 NOTE — Consult Note (Signed)
Neurology consult   CC: code stroke  History is obtained from: EMS  HPI:    Stroke code called for dizziness, L facial droop, L sided weakness and numbness. LKW 0930 this morning. Sx improved en route with EMS and on arrival patient had isolated L facial droop NIHSS = 1. CTH NAICP, chronic R frontal infarct. Not eligible for tPA 2/2 low NIHSS and rapidly resolving sx. Hgb 4.8 on I-STAT 2/2 suspected GIB.  NIHSS:  1a Level of Consciousness: 0 1b LOC Questions: 0 1c LOC Commands: 0 2 Best Gaze: 0 3 Visual: 0 4 Facial Palsy: 1 5a Motor Arm - left: 0 5b Motor Arm - Right: 0 6a Motor Leg - Left: 0 6b Motor Leg - Right: 0 7 Limb Ataxia: 0 8 Sensory: 0 9 Best Language: 0 10 Dysarthria: 0 11 Extinction and Inattention: 0 TOTAL:  1  ROS: A robust ROS was unable to be performed due to emergent nature of event.   Past Medical History:  Diagnosis Date   Abnormal liver function tests 04/03/2015   Atopic dermatitis 01/11/2012   Cataract 07-04-2006   b/l   Chicken pox as a child   DM type 2, goal A1c below 7 07/18/2012   Flashers or floaters, right eye 9-12   floaters   History of shingles    History of tobacco abuse 01/11/2012   Hx of adenomatous colonic polyps 11/07/2014   Hyperglycemia 07/18/2012   Hyperlipidemia    Hypertension    Measles as a child   Medicare annual wellness visit, subsequent 04/17/2011   G4P1 s/p 1 svd, no abnormal papas, menarche at 3 menopause at 33, regular    Mumps as a child   Pain in joint, ankle and foot 04/27/2014   right   Shingles 9-12   Vitamin D deficiency 09/14/2014    Family History  Problem Relation Age of Onset   Hyperlipidemia Mother    Hypertension Mother    Heart disease Mother        MI   Heart attack Father    Diabetes Father        type 2   Cancer Father        type unknown   Heart attack Brother    Heart disease Brother 108       MI   Hemophilia Son        x 2   Hypertension Son    Colon cancer Neg Hx    Stomach cancer Neg  Hx     Social History:  reports that she has quit smoking. Her smoking use included cigarettes. She has a 12.20 pack-year smoking history. She has never used smokeless tobacco. She reports that she does not drink alcohol and does not use drugs.   Prior to Admission medications   Medication Sig Start Date End Date Taking? Authorizing Provider  Alcohol Swabs (B-D SINGLE USE SWABS REGULAR) PADS USE AS DIRECTED ONCE A DAY AND AS NEEDED 12/11/20   Mosie Lukes, MD  aspirin EC 81 MG tablet Take 81 mg by mouth daily as needed (for headache relief.).    [provider]  Blood Glucose Calibration (TRUE METRIX LEVEL 3) High SOLN Use as directed. 07/30/20   Mosie Lukes, MD  Blood Glucose Monitoring Suppl (TRUE METRIX METER) w/Device KIT USE AS DIRECTED ONE TIME DAILY  AND AS NEEDED 11/04/20   Mosie Lukes, MD  calamine lotion Apply 1 application topically 4 (four) times daily as needed (for  itching/irritated skin.).    [provider]  Cholecalciferol (VITAMIN D3) 5000 UNITS CAPS Take 5,000 Units by mouth 2 (two) times daily. MORNING & AFTERNOON    [provider]  metFORMIN (GLUCOPHAGE) 500 MG tablet Take 1 tablet (500 mg total) by mouth daily with breakfast. 10/28/20   Mosie Lukes, MD  Omega-3 Fatty Acids (FISH OIL) 1200 MG CAPS Take 1,200 mg by mouth daily.    [provider]  propranolol (INDERAL) 40 MG tablet TAKE 1 TABLET TWICE DAILY 10/28/20   Mosie Lukes, MD  simvastatin (ZOCOR) 40 MG tablet TAKE 1 TABLET AT BEDTIME 10/26/20   Mosie Lukes, MD  TRUE METRIX BLOOD GLUCOSE TEST test strip USE AS DIRECTED ONE TIME DAILY  AND AS NEEDED 12/11/20   Mosie Lukes, MD  TRUEplus Lancets 33G MISC USE TO CHECK SUGAR ONCE A DAY AND AS NEEDED. 12/11/20   Mosie Lukes, MD    Exam: Current vital signs: BP 106/60   Pulse 71   Temp 98.1 F (36.7 C) (Oral)   Resp 20   Wt 65.8 kg   SpO2 100%   BMI 29.30 kg/m   Physical Exam  Constitutional: Appears  well-developed and well-nourished.  Psych: Affect appropriate to situation. Eyes: No scleral injection. HENT: No OP obstruction. Head: Normocephalic.  Cardiovascular: Normal rate and regular rhythm.  Respiratory: Effort normal.  GI: Abdomen soft.  No distension. There is no tenderness.  Skin: WDI  Neuro: Mental Status: Patient is awake, alert, oriented to person, place, month, year, and situation. Patient is able to give a clear and coherent history. No signs of neglect. Speech/Language:  Speech is clear, fluent without dysarthria or aphasia. Repetition, naming, and comprehension intact.  Cranial Nerves: II: Visual Fields are full. Pupils are equal, round, and reactive to light.  III,IV, VI: EOMI without ptosis or diploplia.  V: Facial sensation is symmetric to light touch in V1, V2, and V3 VII: Mild left facial droop.  VIII: hearing is intact to voice. X: Uvula elevates symmetrically. XI: Shoulder shrug is symmetric. XII: tongue is midline without atrophy or fasciculations.  Motor: RUE:  grips  4+/5     biceps  4+/5     triceps  4+/5 LUE:  grips   3+/5 (hx wrist injury)   biceps  4+/5     triceps  4+/5 RLE: knee  5/5     thigh   5/5     plantar flexion   /55     dorsiflexion  5/5 LLE: knee  5/5     thigh   5/5      plantar flexion   /55     dorsiflexion   5/5 Tone is normal. Bulk is normal.  Sensory: Sensation is symmetric to light touch in all fours extremities. Extinction absent to light touch DSS.  Plantars: Toes are downgoing bilaterally.  Cerebellar: No ataxia noted with FNF and HKS bilaterally.  Gait: deferred.   I have reviewed labs in epic and the pertinent results are: Hgb 5.1.  Creatinine .80.  Glucose 190. FOBT +.  MD reviewed the images obtained:  NCT head  There is no acute intracranial hemorrhage or evidence of acute infarction. ASPECT score is 10.  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 Small chronic infarcts of right putamen.  Moderate  chronic microvascular ischemic changes.   Assessment: 85 yo female with stroke risk factors of prior stroke, DM II, HLD, tobacco use, and HTN. Transient neurologic sx  attributed to recrudescence / transient ischemia 2/2 severe symptomatic anemia. Her sx localize to area of remote R MCA infarct.   Plan No further workup indicated for acute ischemic stroke.  Workup and mgmt of severe anemia per admitting team. Hold antiplatelet in the setting of GIB, restart ASA 19m daily when/if safe to do so for secondary stroke prevention.   Neurology will sign off, but please re-engage if additional questions arise   Electronically signed by: Patient seen by KClance Boll MSN, APN-BC, nurse practitioner and by MD.  Pager: [phone] Neurology Attending Attestation   I examined the patient and discussed plan with NP Ms. KLionel December Above note has been edited by me to reflect my findings and recommendations. I was present throughout the stroke code and made all significant decisions and personally reviewed CNS imaging.    CSu Monks MD [facility] [phone]  If 7pm- 7am, please page neurology on call as listed in [place]

## 2020-12-12 NOTE — Progress Notes (Addendum)
Neurology Attending Interim Note  Stroke code called for dizziness, L facial droop, L sided weakness and numbness. LKW 0930 this morning. Sx improved en route with EMS and on arrival patient had isolated L facial droop NIHSS = 1. CTH NAICP, chronic R frontal infarct. Not eligible for tPA 2/2 low NIHSS and rapidly resolving sx. Hgb 4.8 on I-STAT 2/2 suspected GIB. Transient neurologic sx attributed to recrudescence / transient ischemia 2/2 severe symptomatic anemia. Her sx localize to area of remote R MCA infarct. No further workup indicated for acute ischemic stroke. Workup and mgmt of severe anemia per admitting team. Full consult note to follow.  Su Monks, MD [facility] [phone]  If 7pm- 7am, please page neurology on call as listed in [place].

## 2020-12-12 NOTE — ED Notes (Signed)
Attempted to call report

## 2020-12-12 NOTE — Progress Notes (Addendum)
Received pt from ED, GCS 15 pt appears in no distress, pt denies n/v/d/abdominal discomfort/visual disturbance,  no focal neuro deficits noted. Pt transported by ED transporter with 1 unit bagged/ unprimed PRBCs. blood bank slip notes unit issued from blood bank at 1620, this rn informed blood bank unit of PRBC will be returned, PRBC unit [id number] not started or transfused. Blood bank will reissue a new unit of PRBC to transfuse.

## 2020-12-13 ENCOUNTER — Inpatient Hospital Stay (HOSPITAL_COMMUNITY): Payer: Medicare HMO | Admitting: Anesthesiology

## 2020-12-13 ENCOUNTER — Encounter (HOSPITAL_COMMUNITY)
Admission: EM | Disposition: A | Discharge status: Home / Self Care | Payer: Self-pay | Source: Home / Self Care | Attending: Obstetrics and Gynecology

## 2020-12-13 ENCOUNTER — Encounter (HOSPITAL_COMMUNITY): Payer: Self-pay | Admitting: Family Medicine

## 2020-12-13 DIAGNOSIS — D649 Anemia, unspecified: Secondary | ICD-10-CM

## 2020-12-13 DIAGNOSIS — Z862 Personal history of diseases of the blood and blood-forming organs and certain disorders involving the immune mechanism: Secondary | ICD-10-CM

## 2020-12-13 DIAGNOSIS — R195 Other fecal abnormalities: Secondary | ICD-10-CM | POA: Diagnosis not present

## 2020-12-13 DIAGNOSIS — K3189 Other diseases of stomach and duodenum: Secondary | ICD-10-CM

## 2020-12-13 DIAGNOSIS — D62 Acute posthemorrhagic anemia: Secondary | ICD-10-CM

## 2020-12-13 HISTORY — PX: BIOPSY: SHX5522

## 2020-12-13 HISTORY — PX: ESOPHAGOGASTRODUODENOSCOPY (EGD) WITH PROPOFOL: SHX5813

## 2020-12-13 LAB — BASIC METABOLIC PANEL
Anion gap: 8 (ref 5–15)
BUN: 15 mg/dL (ref 8–23)
CO2: 25 mmol/L (ref 22–32)
Calcium: 8.4 mg/dL — ABNORMAL LOW (ref 8.9–10.3)
Chloride: 105 mmol/L (ref 98–111)
Creatinine, Ser: 0.92 mg/dL (ref 0.44–1.00)
GFR, Estimated: >60 mL/min (ref >60)
Glucose, Bld: 103 mg/dL — ABNORMAL HIGH (ref 70–99)
Potassium: 3.9 mmol/L (ref 3.5–5.1)
Sodium: 138 mmol/L (ref 135–145)

## 2020-12-13 LAB — CBC
HCT: 26.1 % — ABNORMAL LOW (ref 36.0–46.0)
Hemoglobin: 8.6 g/dL — ABNORMAL LOW (ref 12.0–15.0)
MCH: 27 pg (ref 26.0–34.0)
MCHC: 33 g/dL (ref 30.0–36.0)
MCV: 82.1 fL (ref 80.0–100.0)
Platelets: 240 10*3/uL (ref 150–400)
RBC: 3.18 MIL/uL — ABNORMAL LOW (ref 3.87–5.11)
RDW: 16.8 % — ABNORMAL HIGH (ref 11.5–15.5)
WBC: 7.7 10*3/uL (ref 4.0–10.5)
nRBC: 0 % (ref 0.0–0.2)

## 2020-12-13 LAB — GLUCOSE, CAPILLARY
Glucose-Capillary: 100 mg/dL — ABNORMAL HIGH (ref 70–99)
Glucose-Capillary: 110 mg/dL — ABNORMAL HIGH (ref 70–99)
Glucose-Capillary: 118 mg/dL — ABNORMAL HIGH (ref 70–99)
Glucose-Capillary: 106 mg/dL — ABNORMAL HIGH (ref 70–99)
Glucose-Capillary: 130 mg/dL — ABNORMAL HIGH (ref 70–99)
Glucose-Capillary: 109 mg/dL — ABNORMAL HIGH (ref 70–99)

## 2020-12-13 LAB — HEMOGLOBIN AND HEMATOCRIT, BLOOD
HCT: 26.1 % — ABNORMAL LOW (ref 36.0–46.0)
Hemoglobin: 8.6 g/dL — ABNORMAL LOW (ref 12.0–15.0)

## 2020-12-13 SURGERY — ESOPHAGOGASTRODUODENOSCOPY (EGD) WITH PROPOFOL
Anesthesia: Monitor Anesthesia Care

## 2020-12-13 MED ORDER — LIDOCAINE 2% (20 MG/ML) 5 ML SYRINGE
INTRAMUSCULAR | Status: DC | PRN
  Administered 2020-12-13: 40 mg via INTRAVENOUS

## 2020-12-13 MED ORDER — PROPOFOL 500 MG/50ML IV EMUL
INTRAVENOUS | Status: DC | PRN
  Administered 2020-12-13: 100 ug/kg/min via INTRAVENOUS

## 2020-12-13 MED ORDER — LACTATED RINGERS IV SOLN
INTRAVENOUS | Status: AC | PRN
  Administered 2020-12-13: 10 mL/h via INTRAVENOUS

## 2020-12-13 MED ORDER — PROPOFOL 10 MG/ML IV BOLUS
INTRAVENOUS | Status: DC | PRN
  Administered 2020-12-13 (×2): 10 mg via INTRAVENOUS
  Administered 2020-12-13: 20 mg via INTRAVENOUS

## 2020-12-13 MED ORDER — PEG-KCL-NACL-NASULF-NA ASC-C 100 G PO SOLR: 1.0000 | Freq: Once | ORAL | Status: DC

## 2020-12-13 MED ORDER — PHENYLEPHRINE 40 MCG/ML (10ML) SYRINGE FOR IV PUSH (FOR BLOOD PRESSURE SUPPORT)
PREFILLED_SYRINGE | INTRAVENOUS | Status: DC | PRN
  Administered 2020-12-13: 80 ug via INTRAVENOUS

## 2020-12-13 MED ORDER — SODIUM CHLORIDE 0.9 % IV SOLN: INTRAVENOUS | Status: DC

## 2020-12-13 MED ORDER — PEG-KCL-NACL-NASULF-NA ASC-C 100 G PO SOLR
0.5000 | Freq: Once | ORAL | Status: DC
  Filled 2020-12-13 (×2): qty 1

## 2020-12-13 MED ORDER — PANTOPRAZOLE SODIUM 40 MG PO TBEC
40.0000 mg | DELAYED_RELEASE_TABLET | Freq: Every day | ORAL | Status: DC
  Administered 2020-12-13 – 2020-12-15 (×3): 40 mg via ORAL
  Filled 2020-12-13 (×3): qty 1

## 2020-12-13 MED ORDER — PEG-KCL-NACL-NASULF-NA ASC-C 100 G PO SOLR
0.5000 | Freq: Once | ORAL | Status: AC
  Administered 2020-12-13: 100 g via ORAL
  Filled 2020-12-13: qty 1

## 2020-12-13 SURGICAL SUPPLY — 15 items

## 2020-12-13 NOTE — Anesthesia Preprocedure Evaluation (Addendum)
Anesthesia Evaluation  Patient identified by MRN, date of birth, ID band Patient awake    Reviewed: Allergy & Precautions, NPO status , Patient's Chart, lab work & pertinent test results  Airway Mallampati: II  TM Distance: >3 FB Neck ROM: Full    Dental no notable dental hx. (+) Missing, Dental Advisory Given,    Pulmonary neg pulmonary ROS, former smoker,    Pulmonary exam normal breath sounds clear to auscultation       Cardiovascular hypertension, Pt. on medications Normal cardiovascular exam Rhythm:Regular Rate:Normal     Neuro/Psych negative neurological ROS  negative psych ROS   GI/Hepatic negative GI ROS, Neg liver ROS,   Endo/Other  diabetes, Type 2  Renal/GU negative Renal ROS  negative genitourinary   Musculoskeletal negative musculoskeletal ROS (+)   Abdominal   Peds  Hematology Hgb 8.6   Anesthesia Other Findings   Reproductive/Obstetrics                            Anesthesia Physical Anesthesia Plan  ASA: 3  Anesthesia Plan: MAC   Post-op Pain Management:    Induction: Intravenous  PONV Risk Score and Plan: Treatment may vary due to age or medical condition  Airway Management Planned: Natural Airway and Nasal Cannula  Additional Equipment: None  Intra-op Plan:   Post-operative Plan:   Informed Consent: I have reviewed the patients History and Physical, chart, labs and discussed the procedure including the risks, benefits and alternatives for the proposed anesthesia with the patient or authorized representative who has indicated his/her understanding and acceptance.     Dental advisory given  Plan Discussed with: CRNA  Anesthesia Plan Comments: (EGD Colon for GI Bleed anemia)       Anesthesia Quick Evaluation

## 2020-12-13 NOTE — Progress Notes (Signed)
Pt returned from endo by endo staff, pt GCS 15 smiling denies discomfort. Pt daughter and family member at bedside. Fall risk precautions activated and call light within pt reach

## 2020-12-13 NOTE — Op Note (Signed)
[facility] Patient Name: Natalie Swanson Procedure Date : 12/13/2020 MRN: [id number] Attending MD: Ladene Artist , MD Date of Birth: 10-02-1935 CSN: [id number] Age: 85 Admit Type: Inpatient Procedure:                Upper GI endoscopy Indications:              Acute post hemorrhagic anemia, Heme positive stool Providers:                Pricilla Riffle. Fuller Plan, MD, Jeanella Cara, RN,                            Fransico Setters Mbumina, Technician Referring MD:             [facility] Medicines:                Monitored Anesthesia Care Complications:            No immediate complications. Estimated Blood Loss:     Estimated blood loss was minimal. Procedure:                Pre-Anesthesia Assessment:                           - Prior to the procedure, a History and Physical                            was performed, and patient medications and                            allergies were reviewed. The patient's tolerance of                            previous anesthesia was also reviewed. The risks                            and benefits of the procedure and the sedation                            options and risks were discussed with the patient.                            All questions were answered, and informed consent                            was obtained. Prior Anticoagulants: The patient has                            taken no previous anticoagulant or antiplatelet                            agents. ASA Grade Assessment: II - A patient with                            mild systemic disease. After reviewing the risks  and benefits, the patient was deemed in                            satisfactory condition to undergo the procedure.                           After obtaining informed consent, the endoscope was                            passed under direct vision. Throughout the                            procedure, the patient's blood pressure, pulse, and                             oxygen saturations were monitored continuously. The                            GIF-H190 ([id number] Olympus gastroscope was                            introduced through the mouth, and advanced to the                            second part of duodenum. The upper GI endoscopy was                            accomplished without difficulty. The patient                            tolerated the procedure well. Scope In: Scope Out: Findings:      The examined esophagus was normal.      A medium-sized hiatal hernia was present.      Diffuse atrophic mucosa was found in the gastric fundus and in the       gastric body. Biopsies were taken with a cold forceps for histology.      The exam of the stomach was otherwise normal.      The duodenal bulb and second portion of the duodenum were normal. Impression:               - Normal esophagus.                           - Medium-sized hiatal hernia.                           - Gastric mucosal atrophy. Biopsied.                           - Normal duodenal bulb and second portion of the                            duodenum. Recommendation:           - Return patient to hospital ward for ongoing care.                           -  Clear liquid diet today.                           - Continue present medications.                           - Await pathology results.                           - Schedule colonoscopy for tomorrow. Procedure Code(s):        --- Professional ---                           [phone], Esophagogastroduodenoscopy, flexible,                            transoral; with biopsy, single or multiple Diagnosis Code(s):        --- Professional ---                           K44.9, Diaphragmatic hernia without obstruction or                            gangrene                           K31.89, Other diseases of stomach and duodenum                           D62, Acute posthemorrhagic anemia                           R19.5,  Other fecal abnormalities CPT copyright 2019 American Medical Association. All rights reserved. The codes documented in this report are preliminary and upon coder review may  be revised to meet current compliance requirements. Ladene Artist, MD 12/13/2020 11:06:48 AM This report has been signed electronically. Number of Addenda: 0

## 2020-12-13 NOTE — Progress Notes (Signed)
PROGRESS NOTE    Natalie Swanson  ZOX:[id number] DOB: 25-Jul-1935 DOA: 12/12/2020 PCP: Mosie Lukes, MD   Chief Complain: Slurred speech, numbness on the left side  Brief Narrative:  Patient is 85 year old female with history of hypertension, hyperlipidemia, type 2 diabetes mellitus who came to the ED with concern for stroke.  She had developed left-sided numbness and slurred speech at home.  On presentation her neurological symptoms resolved.  She was alert and oriented on presentation and denies any left-sided weakness or word-finding difficulty on presentation.  She was hemodynamically stable.  Lab work showed hemoglobin of 4.8.  Code stroke was called in the emergency department, CT head did not show any acute intracranial abnormalities but showed chronic changes.  Neurology was also consulted and it was concluded that the neurological symptoms were due to transient ischemia secondary to severe symptomatic anemia.  No further neurological studies planned.  GI consulted and the plan is for EGD today.  Assessment & Plan:   Active Problems:   Hypertension   Type 2 diabetes mellitus with hemoglobin A1c goal of less than 7.0% (HCC)   Vitamin D deficiency   Acute blood loss anemia   GI bleed   Slurred speech   Acute blood loss anemia/GI bleed: Presented with weakness for last few weeks.  Hemoglobin was 4.8 on presentation.  FOBT was positive.  No report of hematochezia or melena.  She takes occasional baby aspirin for headache.   GI consulted.  She was initially started on Protonix drip, now on Protonix IV 40 mg daily.  GI planning for EGD today.  If EGD does not show any source of bleeding, plan for colonoscopy on Monday. She was transfused units of PRBC after admission.  Hemoglobin stable in the range of 8.  Left-sided weakness/slurred speech: Transient.  Code stroke was called on presentation.  CT head did not show any acute intracranial normalities but showed area of remote R MCA  infarct.  No reported history of stroke. Neurology was consulted, no plan for further studies.  Her symptoms were attributed to severe symptomatic anemia. Will request PT/OT evaluation when appropriate.  She lives alone at home and uses walker for ambulation.   Hypertension: On propanolol at home.  Blood pressure is soft so propanolol has been held.  Monitor blood pressure  Type 2 diabetes mellitus: Patient hemoglobin A1c 7.3 as per 10/2020.  Takes metformin at home.  Currently on sliding scale insulin.             DVT prophylaxis:SCD Code Status: Full Family Communication: none at bedside Status is: Inpatient  Remains inpatient appropriate because:Unsafe d/c plan  Dispo: The patient is from: Home              Anticipated d/c is to: Home              Patient currently is not medically stable to d/c.   Difficult to place patient No    Consultants: GI  Procedures:Plan for EGD  Antimicrobials:  Anti-infectives (From admission, onward)    None       Subjective:  Patient seen and examined the bedside this morning.  Hemodynamically stable.  Comfortable.  Denies any abdomen pain, nausea or vomiting or any black/red stools.  Her neurological symptoms have completely resolved.   Objective: Vitals:   12/12/20 1934 12/12/20 1945 12/12/20 2225 12/13/20 0354  BP: 116/62 116/62 118/62 (!) 98/56  Pulse: 70 78 77 62  Resp: 18 18 16 18   Temp:  98 F (36.7 C) 98 F (36.7 C) 98.6 F (37 C) 98.8 F (37.1 C)  TempSrc:  Oral Oral Oral  SpO2:  98% 98% 99%  Weight:        Intake/Output Summary (Last 24 hours) at 12/13/2020 0830 Last data filed at 12/12/2020 2225 Gross per 24 hour  Intake 815 ml  Output --  Net 815 ml   Filed Weights   12/12/20 1100  Weight: 65.8 kg    Examination:  General exam: Appears calm and comfortable ,Not in distress, pleasant elderly female HEENT:PERRL,Oral mucosa moist, Ear/Nose normal on gross exam Respiratory system: Bilateral equal air  entry, normal vesicular breath sounds, no wheezes or crackles  Cardiovascular system: S1 & S2 heard, RRR. No JVD, murmurs, rubs, gallops or clicks. No pedal edema. Gastrointestinal system: Abdomen is nondistended, soft and nontender. No organomegaly or masses felt. Normal bowel sounds heard. Central nervous system: Alert and oriented. No focal neurological deficits. Extremities: No edema, no clubbing ,no cyanosis Skin: No rashes, lesions or ulcers,no icterus ,no pallor   Data Reviewed: I have personally reviewed following labs and imaging studies  CBC: Recent Labs  Lab 12/12/20 1143 12/12/20 1147 12/13/20 0313  WBC 10.2  --  7.7  NEUTROABS 6.2  --   --   HGB 4.8* 5.1* 8.6*  8.6*  HCT 16.4* 15.0* 26.1*  26.1*  MCV 89.6  --  82.1  PLT 294  --  829   Basic Metabolic Panel: Recent Labs  Lab 12/12/20 1143 12/12/20 1147 12/13/20 0313  NA 133* 134* 138  K 3.8 3.9 3.9  CL 104 101 105  CO2 20*  --  25  GLUCOSE 195* 190* 103*  BUN 19 22 15   CREATININE 0.97 0.80 0.92  CALCIUM 9.1  --  8.4*   GFR: Estimated Creatinine Clearance: 38.9 mL/min (by C-G formula based on SCr of 0.92 mg/dL). Liver Function Tests: Recent Labs  Lab 12/12/20 1143  AST 22  ALT 13  ALKPHOS 49  BILITOT 0.6  PROT 5.9*  ALBUMIN 3.2*   No results for input(s): LIPASE, AMYLASE in the last 168 hours. No results for input(s): AMMONIA in the last 168 hours. Coagulation Profile: Recent Labs  Lab 12/12/20 1143  INR 1.0   Cardiac Enzymes: No results for input(s): CKTOTAL, CKMB, CKMBINDEX, TROPONINI in the last 168 hours. BNP (last 3 results) No results for input(s): PROBNP in the last 8760 hours. HbA1C: No results for input(s): HGBA1C in the last 72 hours. CBG: Recent Labs  Lab 12/12/20 2057 12/13/20 0608  GLUCAP 236* 100*   Lipid Profile: No results for input(s): CHOL, HDL, LDLCALC, TRIG, CHOLHDL, LDLDIRECT in the last 72 hours. Thyroid Function Tests: No results for input(s): TSH, T4TOTAL,  FREET4, T3FREE, THYROIDAB in the last 72 hours. Anemia Panel: No results for input(s): VITAMINB12, FOLATE, FERRITIN, TIBC, IRON, RETICCTPCT in the last 72 hours. Sepsis Labs: No results for input(s): PROCALCITON, LATICACIDVEN in the last 168 hours.  Recent Results (from the past 240 hour(s))  Resp Panel by RT-PCR (Flu A&B, Covid) Nasopharyngeal Swab     Status: None   Collection Time: 12/12/20  2:33 PM   Specimen: Nasopharyngeal Swab; Nasopharyngeal(NP) swabs in vial transport medium  Result Value Ref Range Status   SARS Coronavirus 2 by RT PCR NEGATIVE NEGATIVE Final    Comment: (NOTE) SARS-CoV-2 target nucleic acids are NOT DETECTED.  The SARS-CoV-2 RNA is generally detectable in upper respiratory specimens during the acute phase of infection. The lowest concentration of  SARS-CoV-2 viral copies this assay can detect is 138 copies/mL. A negative result does not preclude SARS-Cov-2 infection and should not be used as the sole basis for treatment or other patient management decisions. A negative result may occur with  improper specimen collection/handling, submission of specimen other than nasopharyngeal swab, presence of viral mutation(s) within the areas targeted by this assay, and inadequate number of viral copies(<138 copies/mL). A negative result must be combined with clinical observations, patient history, and epidemiological information. The expected result is Negative.  Fact Sheet for Patients:  EntrepreneurPulse.com.au  Fact Sheet for Healthcare Providers:  IncredibleEmployment.be  This test is no t yet approved or cleared by the [place] FDA and  has been authorized for detection and/or diagnosis of SARS-CoV-2 by FDA under an Emergency Use Authorization (EUA). This EUA will remain  in effect (meaning this test can be used) for the duration of the COVID-19 declaration under Section 564(b)(1) of the Act, 21 U.S.C.section  360bbb-3(b)(1), unless the authorization is terminated  or revoked sooner.       Influenza A by PCR NEGATIVE NEGATIVE Final   Influenza B by PCR NEGATIVE NEGATIVE Final    Comment: (NOTE) The Xpert Xpress SARS-CoV-2/FLU/RSV plus assay is intended as an aid in the diagnosis of influenza from Nasopharyngeal swab specimens and should not be used as a sole basis for treatment. Nasal washings and aspirates are unacceptable for Xpert Xpress SARS-CoV-2/FLU/RSV testing.  Fact Sheet for Patients: EntrepreneurPulse.com.au  Fact Sheet for Healthcare Providers: IncredibleEmployment.be  This test is not yet approved or cleared by the [place] FDA and has been authorized for detection and/or diagnosis of SARS-CoV-2 by FDA under an Emergency Use Authorization (EUA). This EUA will remain in effect (meaning this test can be used) for the duration of the COVID-19 declaration under Section 564(b)(1) of the Act, 21 U.S.C. section 360bbb-3(b)(1), unless the authorization is terminated or revoked.  Performed at [facility], [place] [address] [place], [place] [address]          Radiology Studies: CT HEAD CODE STROKE WO CONTRAST  Result Date: 12/12/2020 CLINICAL DATA:  Code stroke.  Slurred speech, left-sided weakness EXAM: CT HEAD WITHOUT CONTRAST TECHNIQUE: Contiguous axial images were obtained from the base of the skull through the vertex without intravenous contrast. COMPARISON:  2005 FINDINGS: Brain: There is no acute intracranial hemorrhage, mass effect, or edema. Chronic appearing right frontal infarction also involving the insula. There are small chronic infarcts of the right putamen. Additional patchy and confluent areas of hypoattenuation in the supratentorial white matter are nonspecific but probably reflect moderate chronic microvascular ischemic changes. Prominence of the ventricles and sulci reflects minor generalized parenchymal volume loss.  There is no extra-axial collection. Vascular: No hyperdense vessel. There is intracranial atherosclerotic calcification at the skull base. Skull: Unremarkable. Sinuses/Orbits: No acute abnormality. Other: Nonspecific bilateral mastoid tip opacification. ASPECTS ([place] Stroke Program Early CT Score) - Ganglionic level infarction (caudate, lentiform nuclei, internal capsule, insula, M1-M3 cortex): 7 - Supraganglionic infarction (M4-M6 cortex): 3 Total score (0-10 with 10 being normal): 10 IMPRESSION: There is no acute intracranial hemorrhage or evidence of acute infarction. ASPECT score is 10. 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 Small chronic infarcts of right putamen. Moderate chronic microvascular ischemic changes. Preliminary results were communicated to Dr. Quinn Axe at 11:54 am on 12/12/2020 by text page via the [facility] messaging system. Electronically Signed   By: Macy Mis M.D.   On: 12/12/2020 11:57  Scheduled Meds:  sodium chloride   Intravenous Once   sodium chloride   Intravenous Once   sodium chloride   Intravenous Once   insulin aspart  0-9 Units Subcutaneous TID WC   pantoprazole (PROTONIX) IV  40 mg Intravenous Q24H   simvastatin  40 mg Oral QHS   sodium chloride flush  3 mL Intravenous Once   sodium chloride flush  3 mL Intravenous Q12H   Continuous Infusions:  sodium chloride     sodium chloride       LOS: 1 day    Time spent: 35 mins.More than 50% of that time was spent in counseling and/or coordination of care.      Shelly Coss, MD [facility] P6/06/2021, 8:30 AM

## 2020-12-13 NOTE — Progress Notes (Addendum)
Pt transported to endo by endo staff, pt GCS 15 appears in no distress. Pt daughter and family member arrived to bedside

## 2020-12-13 NOTE — Anesthesia Preprocedure Evaluation (Signed)
Anesthesia Evaluation  Patient identified by MRN, date of birth, ID band Patient awake    Reviewed: Allergy & Precautions, NPO status , Patient's Chart, lab work & pertinent test results  History of Anesthesia Complications Negative for: history of anesthetic complications  Airway Mallampati: III  TM Distance: >3 FB Neck ROM: Full    Dental  (+) Missing, Dental Advisory Given,    Pulmonary neg shortness of breath, neg sleep apnea, neg COPD, neg recent URI, former smoker,  Covid-19 Nucleic Acid Test Results Lab Results      Component                Value               Date                      SARSCOV2NAA              NEGATIVE            12/12/2020              breath sounds clear to auscultation       Cardiovascular hypertension, Pt. on medications (-) angina Rhythm:Regular     Neuro/Psych negative neurological ROS  negative psych ROS   GI/Hepatic Lab Results      Component                Value               Date                      ALT                      13                  12/12/2020                AST                      22                  12/12/2020                ALKPHOS                  49                  12/12/2020                BILITOT                  0.6                 12/12/2020             anemia, FOBT+   Endo/Other  diabetes, Type 2  Renal/GU Lab Results      Component                Value               Date                      CREATININE               0.92                12/13/2020  Lab Results      Component                Value               Date                      K                        3.9                 12/13/2020                Musculoskeletal   Abdominal   Peds  Hematology  (+) Blood dyscrasia, anemia , Lab Results      Component                Value               Date                      WBC                      7.7                 12/13/2020                HGB                       8.6 (L)             12/13/2020                HGB                      8.6 (L)             12/13/2020                HCT                      26.1 (L)            12/13/2020                HCT                      26.1 (L)            12/13/2020                MCV                      82.1                12/13/2020                PLT                      240                 12/13/2020              Anesthesia Other Findings   Reproductive/Obstetrics                             Anesthesia Physical Anesthesia Plan  ASA: 2  Anesthesia Plan: MAC   Post-op Pain Management:    Induction: Intravenous  PONV Risk Score and Plan: 2 and Treatment may vary due to age or medical condition and Propofol infusion  Airway Management Planned: Nasal Cannula  Additional Equipment: None  Intra-op Plan:   Post-operative Plan:   Informed Consent: I have reviewed the patients History and Physical, chart, labs and discussed the procedure including the risks, benefits and alternatives for the proposed anesthesia with the patient or authorized representative who has indicated his/her understanding and acceptance.     Dental advisory given  Plan Discussed with: CRNA  Anesthesia Plan Comments:         Anesthesia Quick Evaluation

## 2020-12-13 NOTE — Anesthesia Postprocedure Evaluation (Signed)
Anesthesia Post Note  Patient: Natalie Swanson  Procedure(s) Performed: ESOPHAGOGASTRODUODENOSCOPY (EGD) WITH PROPOFOL BIOPSY     Patient location during evaluation: Endoscopy Anesthesia Type: MAC Level of consciousness: awake and alert Pain management: pain level controlled Vital Signs Assessment: post-procedure vital signs reviewed and stable Respiratory status: spontaneous breathing, nonlabored ventilation, respiratory function stable and patient connected to nasal cannula oxygen Cardiovascular status: stable and blood pressure returned to baseline Postop Assessment: no apparent nausea or vomiting Anesthetic complications: no   No notable events documented.  Last Vitals:  Vitals:   12/13/20 1148 12/13/20 1721  BP: (!) 117/47 (!) 107/55  Pulse: (!) 58 (!) 57  Resp: 20 17  Temp: 36.5 C 36.7 C  SpO2: 99% 97%    Last Pain:  Vitals:   12/13/20 1721  TempSrc: Oral  PainSc:                  Jatin Naumann

## 2020-12-13 NOTE — Transfer of Care (Signed)
Immediate Anesthesia Transfer of Care Note  Patient: Natalie Swanson  Procedure(s) Performed: ESOPHAGOGASTRODUODENOSCOPY (EGD) WITH PROPOFOL BIOPSY  Patient Location: PACU  Anesthesia Type:MAC  Level of Consciousness: awake, alert  and oriented  Airway & Oxygen Therapy: Patient Spontanous Breathing  Post-op Assessment: Report given to RN and Post -op Vital signs reviewed and stable  Post vital signs: Reviewed and stable  Last Vitals:  Vitals Value Taken Time  BP    Temp    Pulse    Resp    SpO2      Last Pain:  Vitals:   12/13/20 0932  TempSrc: Oral  PainSc: 0-No pain         Complications: No notable events documented.

## 2020-12-13 NOTE — Interval H&P Note (Signed)
History and Physical Interval Note:  12/13/2020 9:39 AM   Callas  has presented today for surgery, with the diagnosis of anemia, FOBT+.  The various methods of treatment have been discussed with the patient and family. After consideration of risks, benefits and other options for treatment, the patient has consented to  Procedure(s): ESOPHAGOGASTRODUODENOSCOPY (EGD) WITH PROPOFOL (N/A) as a surgical intervention.  The patient's history has been reviewed, patient examined, no change in status, stable for surgery.  I have reviewed the patient's chart and labs.  Questions were answered to the patient's satisfaction.     Pricilla Riffle. Fuller Plan

## 2020-12-14 ENCOUNTER — Inpatient Hospital Stay (HOSPITAL_COMMUNITY): Payer: Medicare HMO | Admitting: Anesthesiology

## 2020-12-14 ENCOUNTER — Encounter (HOSPITAL_COMMUNITY): Payer: Self-pay | Admitting: Family Medicine

## 2020-12-14 ENCOUNTER — Encounter (HOSPITAL_COMMUNITY)
Admission: EM | Disposition: A | Discharge status: Home / Self Care | Payer: Self-pay | Source: Home / Self Care | Attending: Obstetrics and Gynecology

## 2020-12-14 DIAGNOSIS — K635 Polyp of colon: Secondary | ICD-10-CM

## 2020-12-14 DIAGNOSIS — K922 Gastrointestinal hemorrhage, unspecified: Secondary | ICD-10-CM

## 2020-12-14 DIAGNOSIS — K621 Rectal polyp: Secondary | ICD-10-CM | POA: Diagnosis not present

## 2020-12-14 HISTORY — PX: COLONOSCOPY WITH PROPOFOL: SHX5780

## 2020-12-14 HISTORY — PX: POLYPECTOMY: SHX5525

## 2020-12-14 LAB — GLUCOSE, CAPILLARY
Glucose-Capillary: 119 mg/dL — ABNORMAL HIGH (ref 70–99)
Glucose-Capillary: 111 mg/dL — ABNORMAL HIGH (ref 70–99)
Glucose-Capillary: 132 mg/dL — ABNORMAL HIGH (ref 70–99)
Glucose-Capillary: 124 mg/dL — ABNORMAL HIGH (ref 70–99)

## 2020-12-14 LAB — HEMOGLOBIN AND HEMATOCRIT, BLOOD
HCT: 30.4 % — ABNORMAL LOW (ref 36.0–46.0)
Hemoglobin: 9.5 g/dL — ABNORMAL LOW (ref 12.0–15.0)

## 2020-12-14 LAB — CBC WITH DIFFERENTIAL/PLATELET
Abs Immature Granulocytes: 0.02 10*3/uL (ref 0.00–0.07)
Basophils Absolute: 0.1 10*3/uL (ref 0.0–0.1)
Basophils Relative: 1 %
Eosinophils Absolute: 0.3 10*3/uL (ref 0.0–0.5)
Eosinophils Relative: 4 %
HCT: 28.2 % — ABNORMAL LOW (ref 36.0–46.0)
Hemoglobin: 8.9 g/dL — ABNORMAL LOW (ref 12.0–15.0)
Immature Granulocytes: 0 %
Lymphocytes Relative: 32 %
Lymphs Abs: 2.4 10*3/uL (ref 0.7–4.0)
MCH: 26.4 pg (ref 26.0–34.0)
MCHC: 31.6 g/dL (ref 30.0–36.0)
MCV: 83.7 fL (ref 80.0–100.0)
Monocytes Absolute: 0.7 10*3/uL (ref 0.1–1.0)
Monocytes Relative: 10 %
Neutro Abs: 4 10*3/uL (ref 1.7–7.7)
Neutrophils Relative %: 53 %
Platelets: 274 10*3/uL (ref 150–400)
RBC: 3.37 MIL/uL — ABNORMAL LOW (ref 3.87–5.11)
RDW: 16.8 % — ABNORMAL HIGH (ref 11.5–15.5)
WBC: 7.5 10*3/uL (ref 4.0–10.5)
nRBC: 0 % (ref 0.0–0.2)

## 2020-12-14 SURGERY — COLONOSCOPY WITH PROPOFOL
Anesthesia: Monitor Anesthesia Care

## 2020-12-14 MED ORDER — PROPOFOL 500 MG/50ML IV EMUL
INTRAVENOUS | Status: DC | PRN
  Administered 2020-12-14: 75 ug/kg/min via INTRAVENOUS

## 2020-12-14 MED ORDER — PROPOFOL 10 MG/ML IV BOLUS
INTRAVENOUS | Status: DC | PRN
  Administered 2020-12-14: 30 mg via INTRAVENOUS

## 2020-12-14 MED ORDER — LACTATED RINGERS IV SOLN
INTRAVENOUS | Status: DC | PRN
  Administered 2020-12-14: 1000 mL via INTRAVENOUS

## 2020-12-14 MED ORDER — SODIUM CHLORIDE 0.9 % IV SOLN
510.0000 mg | Freq: Once | INTRAVENOUS | Status: AC
  Administered 2020-12-14: 510 mg via INTRAVENOUS
  Filled 2020-12-14: qty 17

## 2020-12-14 SURGICAL SUPPLY — 22 items

## 2020-12-14 NOTE — Interval H&P Note (Signed)
History and Physical Interval Note:  12/14/2020 12:58 PM  Natalie Swanson  has presented today for surgery, with the diagnosis of anemia, GI bleed.  The various methods of treatment have been discussed with the patient and family. After consideration of risks, benefits and other options for treatment, the patient has consented to  Procedure(s): COLONOSCOPY WITH PROPOFOL (N/A) as a surgical intervention.  The patient's history has been reviewed, patient examined, no change in status, stable for surgery.  I have reviewed the patient's chart and labs.  Questions were answered to the patient's satisfaction.     [employer]

## 2020-12-14 NOTE — Op Note (Signed)
[facility] Patient Name: Natalie Swanson Procedure Date : 12/14/2020 MRN: [id number] Attending MD: Justice Britain , MD Date of Birth: 1935/09/02 CSN: [id number] Age: 85 Admit Type: Inpatient Procedure:                Colonoscopy Indications:              Iron deficiency anemia Providers:                Justice Britain, MD, Kary Kos RN, RN,                            Lesia Sago, Technician Referring MD:             Gatha Mayer, MD, Pricilla Riffle. Fuller Plan, MD, [facility], [place] Charlett Blake Medicines:                Monitored Anesthesia Care Complications:            No immediate complications. Estimated Blood Loss:     Estimated blood loss was minimal. Procedure:                Pre-Anesthesia Assessment:                           - Prior to the procedure, a History and Physical                            was performed, and patient medications and                            allergies were reviewed. The patient's tolerance of                            previous anesthesia was also reviewed. The risks                            and benefits of the procedure and the sedation                            options and risks were discussed with the patient.                            All questions were answered, and informed consent                            was obtained. Prior Anticoagulants: The patient has                            taken no previous anticoagulant or antiplatelet                            agents except for aspirin. ASA Grade Assessment:  III - A patient with severe systemic disease. After                            reviewing the risks and benefits, the patient was                            deemed in satisfactory condition to undergo the                            procedure.                           After obtaining informed consent, the colonoscope                            was passed  under direct vision. Throughout the                            procedure, the patient's blood pressure, pulse, and                            oxygen saturations were monitored continuously. The                            PCF-H190DL ([id number] Olympus pediatric colonoscope                            was introduced through the anus and advanced to the                            5 cm into the ileum. The colonoscopy was performed                            without difficulty. The patient tolerated the                            procedure. The quality of the bowel preparation was                            adequate. The terminal ileum, ileocecal valve,                            appendiceal orifice, and rectum were photographed. Scope In: 1:10:59 PM Scope Out: 1:33:49 PM Scope Withdrawal Time: 0 hours 16 minutes 11 seconds  Total Procedure Duration: 0 hours 22 minutes 50 seconds  Findings:      The digital rectal exam findings include hemorrhoids. Pertinent       negatives include no palpable rectal lesions.      The terminal ileum and ileocecal valve appeared normal.      Three sessile polyps were found in the rectum (2) and cecum (1). The       polyps were 2 to 4 mm in size. These polyps were removed with a cold       snare. Resection and retrieval were complete.  Multiple small-mouthed diverticula were found in the recto-sigmoid       colon, sigmoid colon and splenic flexure.      Normal mucosa was found in the entire colon otherwise.      Non-bleeding non-thrombosed external and internal hemorrhoids were found       during retroflexion, during perianal exam and during digital exam. The       hemorrhoids were Grade II (internal hemorrhoids that prolapse but reduce       spontaneously). Impression:               - Hemorrhoids found on digital rectal exam.                           - The examined portion of the ileum was normal.                           - Three 2 to 4 mm polyps in the  rectum and in the                            cecum, removed with a cold snare. Resected and                            retrieved.                           - Diverticulosis in the recto-sigmoid colon, in the                            sigmoid colon and at the splenic flexure.                           - Normal mucosa in the entire examined colon                            otherwise.                           - Non-bleeding non-thrombosed external and internal                            hemorrhoids. Recommendation:           - The patient will be observed post-procedure,                            until all discharge criteria are met.                           - Return patient to hospital ward for ongoing care.                           - Patient has a contact number available for                            emergencies. The signs and symptoms of potential  delayed complications were discussed with the                            patient. Return to normal activities tomorrow.                            Written discharge instructions were provided to the                            patient.                           - Trend Hgb/Hct tonight into tomorrow. If                            downtrending significantly (Hgb<8.0 by this                            evening), will likely proceed with VCE as inpatient                            or otherwise will plan for VCE as outpatient if IDA                            persists.                           -- If Hgb downtrends to <8.0 by this evening, then                            recommend giving 1/2 dose Moviprep overnight into                            tomorrow morning for final clear out before an                            Inpatient VCE.                           - Full liquid diet today.                           - Ensure patient has at least 1 dose of IV Iron                            administered during Inpatient  stay.                           - Hold Aspirin for 1-week and consider restart due                            to history of previous CVA by Outpatient PCP/GI                            team.                           -  Minimize NSAIDs as able.                           - The findings and recommendations were discussed                            with the patient.                           - The findings and recommendations were discussed                            with the patient's family. Procedure Code(s):        --- Professional ---                           [phone], Colonoscopy, flexible; with removal of                            tumor(s), polyp(s), or other lesion(s) by snare                            technique Diagnosis Code(s):        --- Professional ---                           K64.1, Second degree hemorrhoids                           K62.1, Rectal polyp                           K63.5, Polyp of colon                           D50.9, Iron deficiency anemia, unspecified                           K57.30, Diverticulosis of large intestine without                            perforation or abscess without bleeding CPT copyright 2019 [employer]. All rights reserved. The codes documented in this report are preliminary and upon coder review may  be revised to meet current compliance requirements. Justice Britain, MD 12/14/2020 1:52:34 PM Number of Addenda: 0

## 2020-12-14 NOTE — Anesthesia Procedure Notes (Signed)
Procedure Name: MAC Date/Time: 12/14/2020 1:03 PM Performed by: Georgia Duff, CRNA Pre-anesthesia Checklist: Patient identified Oxygen Delivery Method: Nasal cannula Dental Injury: Teeth and Oropharynx as per pre-operative assessment

## 2020-12-14 NOTE — Anesthesia Postprocedure Evaluation (Signed)
Anesthesia Post Note  Patient: Natalie Swanson  Procedure(s) Performed: COLONOSCOPY WITH PROPOFOL POLYPECTOMY     Patient location during evaluation: Endoscopy Anesthesia Type: MAC Level of consciousness: awake and alert Pain management: pain level controlled Vital Signs Assessment: post-procedure vital signs reviewed and stable Respiratory status: spontaneous breathing, nonlabored ventilation, respiratory function stable and patient connected to nasal cannula oxygen Cardiovascular status: blood pressure returned to baseline and stable Postop Assessment: no apparent nausea or vomiting Anesthetic complications: no   No notable events documented.  Last Vitals:  Vitals:   12/14/20 0515 12/14/20 1153  BP: 121/76 (!) 134/53  Pulse: 76 65  Resp: 18 (!) 23  Temp: 36.9 C   SpO2: 96% 98%    Last Pain:  Vitals:   12/14/20 1153  TempSrc: Oral  PainSc: 0-No pain                 Barnet Glasgow

## 2020-12-14 NOTE — Progress Notes (Signed)
PROGRESS NOTE    Natalie Swanson  MRN:[id number] DOB: 10/28/1935 DOA: 12/12/2020 PCP: Blyth, Stacey A, MD   Chief Complain: Slurred speech, numbness on the left side  Brief Narrative:  Patient is 85-year-old female with history of hypertension, hyperlipidemia, type 2 diabetes mellitus who came to the ED with concern for stroke.  She had developed left-sided numbness and slurred speech at home.  On presentation her neurological symptoms resolved.  She was alert and oriented on presentation and denies any left-sided weakness or word-finding difficulty on presentation.  She was hemodynamically stable.  Lab work showed hemoglobin of 4.8.  Code stroke was called in the emergency department, CT head did not show any acute intracranial abnormalities but showed chronic changes.  Neurology was also consulted and it was concluded that the neurological symptoms were due to transient ischemia secondary to severe symptomatic anemia.  No further neurological studies planned.   Assessment & Plan:   Active Problems:   Hypertension   Type 2 diabetes mellitus with hemoglobin A1c goal of less than 7.0% (HCC)   Vitamin D deficiency   Acute blood loss anemia   GI bleed   Slurred speech   Severe anemia   Acute blood loss anemia/GI bleed: Presented with weakness for last few weeks.  Hemoglobin was 4.8 on presentation.  FOBT was positive.  No report of hematochezia or melena.  She takes occasional baby aspirin for headache.   GI consulted.  She was initially started on Protonix drip, now on oral protonix. Has received 2 U and hgb stable this morning 8.9 from 8.6 the day prior. EGD w/ gastric atrophy o/w unremarkable. Colonoscopy today with two polyps, diverticulosis, no etiology of bleed seen. - per GI repeat hgb tonight, if hgb less than 8 give 1/2 of moviprep with plan for VCE tomorrow - IV iron also ordered.  Left-sided weakness/slurred speech: Transient.  Code stroke was called on presentation.  CT head  did not show any acute intracranial normalities but showed area of remote R MCA infarct.  No reported history of stroke. Neurology was consulted, no plan for further studies.  Her symptoms were attributed to severe symptomatic anemia. Will request PT/OT evaluation when appropriate.  She lives alone at home and uses walker for ambulation.   Hypertension: On propanolol at home.  Blood pressure is soft so propanolol has been held.  Monitor blood pressure  Type 2 diabetes mellitus: Patient hemoglobin A1c 7.3 as per 10/2020.  Takes metformin at home.  Here we are monitoring fasting sugars      DVT prophylaxis:SCD Code Status: Full Family Communication: none at bedside Status is: Inpatient  Remains inpatient appropriate because:Unsafe d/c plan  Dispo: The patient is from: Home              Anticipated d/c is to: Home              Patient currently is not medically stable to d/c.   Difficult to place patient No    Consultants: GI  Procedures:Plan for EGD  Antimicrobials:  Anti-infectives (From admission, onward)    None       Subjective:  Patient seen and examined the bedside this morning.  Hemodynamically stable.  Comfortable.  Denies any abdomen pain, nausea or vomiting or any black/red stools.     Objective: Vitals:   12/14/20 1341 12/14/20 1350 12/14/20 1400 12/14/20 1446  BP: (!) 121/54 (!) 128/53 (!) 149/67 137/69  Pulse:  64 (!) 58 73  Resp: 17 17   PROGRESS NOTE    Natalie Swanson  MRN:[id number] DOB: 10/28/1935 DOA: 12/12/2020 PCP: Blyth, Stacey A, MD   Chief Complain: Slurred speech, numbness on the left side  Brief Narrative:  Patient is 85-year-old female with history of hypertension, hyperlipidemia, type 2 diabetes mellitus who came to the ED with concern for stroke.  She had developed left-sided numbness and slurred speech at home.  On presentation her neurological symptoms resolved.  She was alert and oriented on presentation and denies any left-sided weakness or word-finding difficulty on presentation.  She was hemodynamically stable.  Lab work showed hemoglobin of 4.8.  Code stroke was called in the emergency department, CT head did not show any acute intracranial abnormalities but showed chronic changes.  Neurology was also consulted and it was concluded that the neurological symptoms were due to transient ischemia secondary to severe symptomatic anemia.  No further neurological studies planned.   Assessment & Plan:   Active Problems:   Hypertension   Type 2 diabetes mellitus with hemoglobin A1c goal of less than 7.0% (HCC)   Vitamin D deficiency   Acute blood loss anemia   GI bleed   Slurred speech   Severe anemia   Acute blood loss anemia/GI bleed: Presented with weakness for last few weeks.  Hemoglobin was 4.8 on presentation.  FOBT was positive.  No report of hematochezia or melena.  She takes occasional baby aspirin for headache.   GI consulted.  She was initially started on Protonix drip, now on oral protonix. Has received 2 U and hgb stable this morning 8.9 from 8.6 the day prior. EGD w/ gastric atrophy o/w unremarkable. Colonoscopy today with two polyps, diverticulosis, no etiology of bleed seen. - per GI repeat hgb tonight, if hgb less than 8 give 1/2 of moviprep with plan for VCE tomorrow - IV iron also ordered.  Left-sided weakness/slurred speech: Transient.  Code stroke was called on presentation.  CT head  did not show any acute intracranial normalities but showed area of remote R MCA infarct.  No reported history of stroke. Neurology was consulted, no plan for further studies.  Her symptoms were attributed to severe symptomatic anemia. Will request PT/OT evaluation when appropriate.  She lives alone at home and uses walker for ambulation.   Hypertension: On propanolol at home.  Blood pressure is soft so propanolol has been held.  Monitor blood pressure  Type 2 diabetes mellitus: Patient hemoglobin A1c 7.3 as per 10/2020.  Takes metformin at home.  Here we are monitoring fasting sugars      DVT prophylaxis:SCD Code Status: Full Family Communication: none at bedside Status is: Inpatient  Remains inpatient appropriate because:Unsafe d/c plan  Dispo: The patient is from: Home              Anticipated d/c is to: Home              Patient currently is not medically stable to d/c.   Difficult to place patient No    Consultants: GI  Procedures:Plan for EGD  Antimicrobials:  Anti-infectives (From admission, onward)    None       Subjective:  Patient seen and examined the bedside this morning.  Hemodynamically stable.  Comfortable.  Denies any abdomen pain, nausea or vomiting or any black/red stools.     Objective: Vitals:   12/14/20 1341 12/14/20 1350 12/14/20 1400 12/14/20 1446  BP: (!) 121/54 (!) 128/53 (!) 149/67 137/69  Pulse:  64 (!) 58 73  Resp: 17 17   PROGRESS NOTE    Natalie Swanson  OEU:[id number] DOB: [birth year] DOA: 12/12/2020 PCP: Mosie Lukes, MD   Chief Complain: Slurred speech, numbness on the left side  Brief Narrative:  Patient is 85 year old female with history of hypertension, hyperlipidemia, type 2 diabetes mellitus who came to the ED with concern for stroke.  She had developed left-sided numbness and slurred speech at home.  On presentation her neurological symptoms resolved.  She was alert and oriented on presentation and denies any left-sided weakness or word-finding difficulty on presentation.  She was hemodynamically stable.  Lab work showed hemoglobin of 4.8.  Code stroke was called in the emergency department, CT head did not show any acute intracranial abnormalities but showed chronic changes.  Neurology was also consulted and it was concluded that the neurological symptoms were due to transient ischemia secondary to severe symptomatic anemia.  No further neurological studies planned.   Assessment & Plan:   Active Problems:   Hypertension   Type 2 diabetes mellitus with hemoglobin A1c goal of less than 7.0% (HCC)   Vitamin D deficiency   Acute blood loss anemia   GI bleed   Slurred speech   Severe anemia   Acute blood loss anemia/GI bleed: Presented with weakness for last few weeks.  Hemoglobin was 4.8 on presentation.  FOBT was positive.  No report of hematochezia or melena.  She takes occasional baby aspirin for headache.   GI consulted.  She was initially started on Protonix drip, now on oral protonix. Has received 2 U and hgb stable this morning 8.9 from 8.6 the day prior. EGD w/ gastric atrophy o/w unremarkable. Colonoscopy today with two polyps, diverticulosis, no etiology of bleed seen. - per GI repeat hgb tonight, if hgb less than 8 give 1/2 of moviprep with plan for VCE tomorrow - IV iron also ordered.  Left-sided weakness/slurred speech: Transient.  Code stroke was called on presentation.  CT head  did not show any acute intracranial normalities but showed area of remote R MCA infarct.  No reported history of stroke. Neurology was consulted, no plan for further studies.  Her symptoms were attributed to severe symptomatic anemia. Will request PT/OT evaluation when appropriate.  She lives alone at home and uses walker for ambulation.   Hypertension: On propanolol at home.  Blood pressure is soft so propanolol has been held.  Monitor blood pressure  Type 2 diabetes mellitus: Patient hemoglobin A1c 7.3 as per 10/2020.  Takes metformin at home.  Here we are monitoring fasting sugars      DVT prophylaxis:SCD Code Status: Full Family Communication: none at bedside Status is: Inpatient  Remains inpatient appropriate because:Unsafe d/c plan  Dispo: The patient is from: Home              Anticipated d/c is to: Home              Patient currently is not medically stable to d/c.   Difficult to place patient No    Consultants: GI  Procedures:Plan for EGD  Antimicrobials:  Anti-infectives (From admission, onward)    None       Subjective:  Patient seen and examined the bedside this morning.  Hemodynamically stable.  Comfortable.  Denies any abdomen pain, nausea or vomiting or any black/red stools.     Objective: Vitals:   12/14/20 1341 12/14/20 1350 12/14/20 1400 12/14/20 1446  BP: (!) 121/54 (!) 128/53 (!) 149/67 137/69  Pulse:  64 (!) 58 73  Resp: 17 17

## 2020-12-15 ENCOUNTER — Encounter: Payer: Self-pay | Admitting: Gastroenterology

## 2020-12-15 DIAGNOSIS — D509 Iron deficiency anemia, unspecified: Secondary | ICD-10-CM

## 2020-12-15 DIAGNOSIS — E559 Vitamin D deficiency, unspecified: Secondary | ICD-10-CM

## 2020-12-15 DIAGNOSIS — I10 Essential (primary) hypertension: Secondary | ICD-10-CM

## 2020-12-15 DIAGNOSIS — R4781 Slurred speech: Secondary | ICD-10-CM

## 2020-12-15 DIAGNOSIS — E119 Type 2 diabetes mellitus without complications: Secondary | ICD-10-CM

## 2020-12-15 LAB — GLUCOSE, CAPILLARY
Glucose-Capillary: 117 mg/dL — ABNORMAL HIGH (ref 70–99)
Glucose-Capillary: 118 mg/dL — ABNORMAL HIGH (ref 70–99)
Glucose-Capillary: 109 mg/dL — ABNORMAL HIGH (ref 70–99)

## 2020-12-15 LAB — SURGICAL PATHOLOGY

## 2020-12-15 MED ORDER — FERROUS SULFATE 325 (65 FE) MG PO TABS: 325.0000 mg | ORAL_TABLET | Freq: Every day | ORAL | 0 refills | Status: DC

## 2020-12-15 MED ORDER — PANTOPRAZOLE SODIUM 40 MG PO TBEC: 40.0000 mg | DELAYED_RELEASE_TABLET | Freq: Every day | ORAL | 0 refills | Status: AC

## 2020-12-15 MED ORDER — FERROUS SULFATE 325 (65 FE) MG PO TABS: 325.0000 mg | ORAL_TABLET | Freq: Every day | ORAL | Status: DC

## 2020-12-15 NOTE — Discharge Summary (Signed)
Discharge Summary  Natalie Swanson:[id number] DOB: 1935/11/17  PCP: Mosie Lukes, MD  Admit date: 12/12/2020 Discharge date: 12/15/2020  Time spent: 40 mins  Recommendations for Outpatient Follow-up:  PCP in 1 week with repeat labs GI as scheduled/needed   Discharge Diagnoses:  Active Hospital Problems   Diagnosis Date Noted   Severe anemia    Acute blood loss anemia 12/12/2020   GI bleed 12/12/2020   Slurred speech 12/12/2020   Vitamin D deficiency 09/14/2014   Type 2 diabetes mellitus with hemoglobin A1c goal of less than 7.0% (HCC) 07/18/2012   Hypertension     Resolved Hospital Problems  No resolved problems to display.    Discharge Condition: Stable  Diet recommendation: As tolerated  Vitals:   12/15/20 0517 12/15/20 1148  BP: 116/64 116/70  Pulse: 76 85  Resp: 16 16  Temp: 98.6 F (37 C) 98.7 F (37.1 C)  SpO2: 93% 95%    History of present illness:  Patient is 85 year old female with history of hypertension, hyperlipidemia, type 2 diabetes mellitus who came to the ED with concern for stroke.  She had developed left-sided numbness and slurred speech at home.  On presentation her neurological symptoms resolved.  She was alert and oriented on presentation and denies any left-sided weakness or word-finding difficulty on presentation.  She was hemodynamically stable.  Lab work showed hemoglobin of 4.8.  Code stroke was called in the emergency department, CT head did not show any acute intracranial abnormalities but showed chronic changes.  Neurology was also consulted and it was concluded that the neurological symptoms were due to transient ischemia secondary to severe symptomatic anemia.  No further neurological studies planned.  Admitted for further management.    Today, patient denies any new complaints, denies any further GI bleed, denies any chest pain, abdominal pain, nausea/vomiting, fever/chills, SOB.  Patient able to ambulate without a walker.   Follow-up with PCP in 1 week, GI as scheduled.    Hospital Course:  Active Problems:   Hypertension   Type 2 diabetes mellitus with hemoglobin A1c goal of less than 7.0% (HCC)   Vitamin D deficiency   Acute blood loss anemia   GI bleed   Slurred speech   Severe anemia   Acute blood loss anemia/possible UGI bleed Hemoglobin was 4.8 on presentation.  FOBT was positive No report of hematochezia or melena, On ASA   Received 2 units of PRBC, hemoglobin has remained stable GI consulted, EGD w/ gastric atrophy o/w unremarkable. Colonoscopy with two polyps, diverticulosis, no etiology of bleed seen S/P IV iron Started p.o. PPI GI advised to hold ASA for 1 week, continue p.o. iron Follow-up with PCP with repeat labs   Left-sided weakness/slurred speech likely TIA Transient.  Code stroke was called on presentation CT head did not show any acute intracranial normalities but showed area of remote R MCA infarct.  No reported history of stroke. Neurology was consulted, no plan for further studies.  Her symptoms were attributed to severe symptomatic anemia   Hypertension Continue propanolol at home  Type 2 diabetes mellitus Patient hemoglobin A1c 7.3 as per 10/2020 Continue home regimen     Estimated body mass index is 24.89 kg/m as calculated from the following:   Height as of this encounter: _0  (1.626 m).   Weight as of this encounter: 65.8 kg.    Procedures: EGD/colonoscopy  Consultations: GI  Discharge Exam: BP 116/70 (BP Location: Right Arm)   Pulse 85   Temp  98.7 F (37.1 C) (Oral)   Resp 16   Ht _0  (1.626 m)   Wt 65.8 kg   SpO2 95%   BMI 24.89 kg/m    General: NAD Cardiovascular: S1, S2 present Respiratory: CTA B Abdomen: Soft, nontender, nondistended, bowel sounds present    Discharge Instructions You were cared for by a hospitalist during your hospital stay. If you have any questions about your discharge medications or the care you received while  you were in the hospital after you are discharged, you can call the unit and asked to speak with the hospitalist on call if the hospitalist that took care of you is not available. Once you are discharged, your primary care physician will handle any further medical issues. Please note that NO REFILLS for any discharge medications will be authorized once you are discharged, as it is imperative that you return to your primary care physician (or establish a relationship with a primary care physician if you do not have one) for your aftercare needs so that they can reassess your need for medications and monitor your lab values.  Discharge Instructions     Diet - low sodium heart healthy   Complete by: As directed    Increase activity slowly   Complete by: As directed       Allergies as of 12/15/2020       Reactions   Sulfa Antibiotics Rash        Medication List     STOP taking these medications    aspirin EC 81 MG tablet       TAKE these medications    B-D SINGLE USE SWABS REGULAR Pads USE AS DIRECTED ONCE A DAY AND AS NEEDED What changed: See the new instructions.   calamine lotion Apply 1 application topically 4 (four) times daily as needed (for itching/irritated skin.).   ferrous sulfate 325 (65 FE) MG tablet Take 1 tablet (325 mg total) by mouth daily with breakfast. Start taking on: December 16, 2020   Fish Oil 1200 MG Caps Take 1,200 mg by mouth daily.   pantoprazole 40 MG tablet Commonly known as: PROTONIX Take 1 tablet (40 mg total) by mouth daily at 6 (six) AM. Start taking on: December 16, 2020   predniSONE 10 MG tablet Commonly known as: DELTASONE Take 30 mg by mouth daily.   propranolol 40 MG tablet Commonly known as: INDERAL TAKE 1 TABLET TWICE DAILY   simvastatin 40 MG tablet Commonly known as: ZOCOR TAKE 1 TABLET AT BEDTIME   True Metrix Blood Glucose Test test strip Generic drug: glucose blood USE AS DIRECTED ONE TIME DAILY  AND AS NEEDED What  changed: See the new instructions.   True Metrix Level 3 High Soln Use as directed. What changed:  how much to take how to take this when to take this   True Metrix Meter w/Device Kit USE AS DIRECTED ONE TIME DAILY  AND AS NEEDED What changed:  how much to take how to take this when to take this   TRUEplus Lancets 33G Misc USE TO CHECK SUGAR ONCE A DAY AND AS NEEDED. What changed: See the new instructions.   Vitamin D3 125 MCG (5000 UT) Caps Take 5,000 Units by mouth 2 (two) times daily. MORNING & AFTERNOON       ASK your doctor about these medications    metFORMIN 500 MG tablet Commonly known as: GLUCOPHAGE Take 1 tablet (500 mg total) by mouth daily with breakfast.  Allergies  Allergen Reactions   Sulfa Antibiotics Rash    Follow-up Information     Mosie Lukes, MD. Schedule an appointment as soon as possible for a visit in 1 week(s).   Specialty: Family Medicine Contact information: [address] [place] [address] [phone]         Mansouraty, Telford Nab., MD Follow up.   Specialties: Gastroenterology, Internal Medicine Why: Office will contact you as needed for follow up Contact information: [place] [address] [phone]                  The results of significant diagnostics from this hospitalization (including imaging, microbiology, ancillary and laboratory) are listed below for reference.    Significant Diagnostic Studies: CT HEAD CODE STROKE WO CONTRAST  Result Date: 12/12/2020 CLINICAL DATA:  Code stroke.  Slurred speech, left-sided weakness EXAM: CT HEAD WITHOUT CONTRAST TECHNIQUE: Contiguous axial images were obtained from the base of the skull through the vertex without intravenous contrast. COMPARISON:  2005 FINDINGS: Brain: There is no acute intracranial hemorrhage, mass effect, or edema. Chronic appearing right frontal infarction also involving the insula. There are small chronic  infarcts of the right putamen. Additional patchy and confluent areas of hypoattenuation in the supratentorial white matter are nonspecific but probably reflect moderate chronic microvascular ischemic changes. Prominence of the ventricles and sulci reflects minor generalized parenchymal volume loss. There is no extra-axial collection. Vascular: No hyperdense vessel. There is intracranial atherosclerotic calcification at the skull base. Skull: Unremarkable. Sinuses/Orbits: No acute abnormality. Other: Nonspecific bilateral mastoid tip opacification. ASPECTS ([place] Stroke Program Early CT Score) - Ganglionic level infarction (caudate, lentiform nuclei, internal capsule, insula, M1-M3 cortex): 7 - Supraganglionic infarction (M4-M6 cortex): 3 Total score (0-10 with 10 being normal): 10 IMPRESSION: There is no acute intracranial hemorrhage or evidence of acute infarction. ASPECT score is 10. 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 Small chronic infarcts of right putamen. Moderate chronic microvascular ischemic changes. Preliminary results were communicated to Dr. Quinn Axe at 11:54 am on 12/12/2020 by text page via the [facility] messaging system. Electronically Signed   By: Macy Mis M.D.   On: 12/12/2020 11:57    Microbiology: Recent Results (from the past 240 hour(s))  Resp Panel by RT-PCR (Flu A&B, Covid) Nasopharyngeal Swab     Status: None   Collection Time: 12/12/20  2:33 PM   Specimen: Nasopharyngeal Swab; Nasopharyngeal(NP) swabs in vial transport medium  Result Value Ref Range Status   SARS Coronavirus 2 by RT PCR NEGATIVE NEGATIVE Final    Comment: (NOTE) SARS-CoV-2 target nucleic acids are NOT DETECTED.  The SARS-CoV-2 RNA is generally detectable in upper respiratory specimens during the acute phase of infection. The lowest concentration of SARS-CoV-2 viral copies this assay can detect is 138 copies/mL. A negative result does not preclude SARS-Cov-2 infection and should not be used as  the sole basis for treatment or other patient management decisions. A negative result may occur with  improper specimen collection/handling, submission of specimen other than nasopharyngeal swab, presence of viral mutation(s) within the areas targeted by this assay, and inadequate number of viral copies(<138 copies/mL). A negative result must be combined with clinical observations, patient history, and epidemiological information. The expected result is Negative.  Fact Sheet for Patients:  EntrepreneurPulse.com.au  Fact Sheet for Healthcare Providers:  IncredibleEmployment.be  This test is no t yet approved or cleared by the [place] and  has been authorized for  detection and/or diagnosis of SARS-CoV-2 by FDA under an Emergency Use Authorization (EUA). This EUA will remain  in effect (meaning this test can be used) for the duration of the COVID-19 declaration under Section 564(b)(1) of the Act, 21 U.S.C.section 360bbb-3(b)(1), unless the authorization is terminated  or revoked sooner.       Influenza A by PCR NEGATIVE NEGATIVE Final   Influenza B by PCR NEGATIVE NEGATIVE Final    Comment: (NOTE) The Xpert Xpress SARS-CoV-2/FLU/RSV plus assay is intended as an aid in the diagnosis of influenza from Nasopharyngeal swab specimens and should not be used as a sole basis for treatment. Nasal washings and aspirates are unacceptable for Xpert Xpress SARS-CoV-2/FLU/RSV testing.  Fact Sheet for Patients: EntrepreneurPulse.com.au  Fact Sheet for Healthcare Providers: IncredibleEmployment.be  This test is not yet approved or cleared by the [place] FDA and has been authorized for detection and/or diagnosis of SARS-CoV-2 by FDA under an Emergency Use Authorization (EUA). This EUA will remain in effect (meaning this test can be used) for the duration of the COVID-19 declaration under Section 564(b)(1) of  the Act, 21 U.S.C. section 360bbb-3(b)(1), unless the authorization is terminated or revoked.  Performed at [facility], [place] [address], [place], [place] [address]      Labs: Basic Metabolic Panel: Recent Labs  Lab 12/12/20 1143 12/12/20 1147 12/13/20 0313  NA 133* 134* 138  K 3.8 3.9 3.9  CL 104 101 105  CO2 20*  --  25  GLUCOSE 195* 190* 103*  BUN _0 CREATININE 0.97 0.80 0.92  CALCIUM 9.1  --  8.4*   Liver Function Tests: Recent Labs  Lab 12/12/20 1143  AST 22  ALT 13  ALKPHOS 49  BILITOT 0.6  PROT 5.9*  ALBUMIN 3.2*   No results for input(s): LIPASE, AMYLASE in the last 168 hours. No results for input(s): AMMONIA in the last 168 hours. CBC: Recent Labs  Lab 12/12/20 1143 12/12/20 1147 12/13/20 0313 12/14/20 0923 12/14/20 1649  WBC 10.2  --  7.7 7.5  --   NEUTROABS 6.2  --   --  4.0  --   HGB 4.8* 5.1* 8.6*  8.6* 8.9* 9.5*  HCT 16.4* 15.0* 26.1*  26.1* 28.2* 30.4*  MCV 89.6  --  82.1 83.7  --   PLT 294  --  240 274  --    Cardiac Enzymes: No results for input(s): CKTOTAL, CKMB, CKMBINDEX, TROPONINI in the last 168 hours. BNP: BNP (last 3 results) No results for input(s): BNP in the last 8760 hours.  ProBNP (last 3 results) No results for input(s): PROBNP in the last 8760 hours.  CBG: Recent Labs  Lab 12/14/20 1604 12/14/20 2146 12/15/20 0600 12/15/20 1111 12/15/20 1608  GLUCAP 132* 124* 117* 118* 109*       Signed:  Alma Friendly, MD [facility] 12/15/2020, 5:18 PM

## 2020-12-15 NOTE — Plan of Care (Signed)

## 2020-12-15 NOTE — Progress Notes (Addendum)
Daily Rounding Note  12/15/2020, 9:25 AM  LOS: 3 days   SUBJECTIVE:   Chief complaint:   IDA   Feeling well.  No dizziness.  No stools since bowel prep.    OBJECTIVE:         Vital signs in last 24 hours:    Temp:  [97.6 F (36.4 C)-98.6 F (37 C)] 98.6 F (37 C) (06/14 0517) Pulse Rate:  [58-86] 76 (06/14 0517) Resp:  [15-23] 16 (06/14 0517) BP: (108-149)/(51-69) 116/64 (06/14 0517) SpO2:  [93 %-100 %] 93 % (06/14 0517) Last BM Date: 12/14/20 Filed Weights   12/12/20 1100 12/13/20 0932  Weight: 65.8 kg 65.8 kg   General: looks well   Heart: RRR Chest: clear bil.   Abdomen: soft, NT, ND.  Active BS  Extremities: no CCE Neuro/Psych:  alert, oriented x 3.  No deficits, tremors, weakness.    Intake/Output from previous day: 06/13 0701 - 06/14 0700 In: 510 [P.O.:360; I.V.:150] Out: -   Intake/Output this shift: Total I/O In: 240 [P.O.:240] Out: -   Lab Results: Recent Labs    12/12/20 1143 12/12/20 1147 12/13/20 0313 12/14/20 0923 12/14/20 1649  WBC 10.2  --  7.7 7.5  --   HGB 4.8*   < > 8.6*  8.6* 8.9* 9.5*  HCT 16.4*   < > 26.1*  26.1* 28.2* 30.4*  PLT 294  --  240 274  --    < > = values in this interval not displayed.   BMET Recent Labs    12/12/20 1143 12/12/20 1147 12/13/20 0313  NA 133* 134* 138  K 3.8 3.9 3.9  CL 104 101 105  CO2 20*  --  25  GLUCOSE 195* 190* 103*  BUN _0 CREATININE 0.97 0.80 0.92  CALCIUM 9.1  --  8.4*   LFT Recent Labs    12/12/20 1143  PROT 5.9*  ALBUMIN 3.2*  AST 22  ALT 13  ALKPHOS 49  BILITOT 0.6   PT/INR Recent Labs    12/12/20 1143  LABPROT 13.6  INR 1.0   Hepatitis Panel No results for input(s): HEPBSAG, HCVAB, HEPAIGM, HEPBIGM in the last 72 hours.  Studies/Results: No results found.  Scheduled Meds:  sodium chloride   Intravenous Once   sodium chloride   Intravenous Once   sodium chloride   Intravenous Once    pantoprazole  40 mg Oral Q0600   peg 3350 powder  0.5 kit Oral Once   simvastatin  40 mg Oral QHS   sodium chloride flush  3 mL Intravenous Once   sodium chloride flush  3 mL Intravenous Q12H   Continuous Infusions:  sodium chloride     sodium chloride     PRN Meds:.sodium chloride, acetaminophen **OR** acetaminophen, sodium chloride flush   ASSESMENT:      Iron deficiency anemia. 6/12 EGD.  Biopsy of gastric mucosal atrophy.  Medium sized hiatal hernia.  Normal esophagus and examined duodenum.   12/14/2020 colonoscopy.  3 polyps at rectum, cecum removed.  Left  sided diverticulosis.  None bleeding hemorrhoids.  Received Feraheme 6/13.  3 PRBCs 6/11.  Hgb 5.1 >> 9.5.  Polyp pathology shows a sessile serrated polyp on 2 of 5 fragments, hyperplastic polyp on 1 of 5 fragments and benign colonic mucosa on 2 of 5 fragments.   PLAN      Given improved Hgb, no plans to pursue VCE.  Hold aspirin for  total of 7 days.  Indication is history CVA.    Added Iron sulfate 325 mg daily.    Await pathology from gastric biopsy, interesting that the colon polyp results came in within less than 24 hours and we are still waiting on the gastric biopsies from 6/12  Will discuss case with Dr. Jerilynn Mages and provide guidance as to follow-up labs.  Not clear she needs GI visit in future.  Her next PCP appointment is in late October.     Natalie Swanson  12/15/2020, 9:25 AM Phone [phone]     Attending Physician's Attestation   I have taken an interval history, reviewed the chart and examined the patient.   I agree with the Advanced Practitioner's note, impression, and recommendations with updates and my documentation above.   Justice Britain, MD [facility] Advanced Endoscopy Office # [phone]

## 2020-12-15 NOTE — Progress Notes (Signed)
AVS reviewed with patient/family. All questions answered at this time. Transportation provided by family.

## 2020-12-16 ENCOUNTER — Telehealth: Payer: Self-pay

## 2020-12-16 ENCOUNTER — Encounter (HOSPITAL_COMMUNITY): Payer: Self-pay | Admitting: Gastroenterology

## 2020-12-16 LAB — BPAM RBC
Blood Product Expiration Date: 202207112359
Blood Product Expiration Date: 202207112359
Blood Product Expiration Date: 202207112359
Blood Product Expiration Date: 202207122359
ISSUE DATE / TIME: 202206111320
ISSUE DATE / TIME: 202206111620
ISSUE DATE / TIME: 202206111856
Unit Type and Rh: 5100
Unit Type and Rh: 5100
Unit Type and Rh: 5100
Unit Type and Rh: 5100

## 2020-12-16 LAB — TYPE AND SCREEN
ABO/RH(D): O POS
Antibody Screen: NEGATIVE
Unit division: 0
Unit division: 0
Unit division: 0
Unit division: 0

## 2020-12-16 LAB — SURGICAL PATHOLOGY

## 2020-12-16 NOTE — Telephone Encounter (Signed)
Transition Care Management Follow-up Telephone Call Date of discharge and from where: 12/15/2020-[facility] How have you been since you were released from the hospital? Doing fine-Feeling much better Any questions or concerns? No  Items Reviewed: Did the pt receive and understand the discharge instructions provided? Yes  Medications obtained and verified? Yes  Other? Yes  Any new allergies since your discharge? No  Dietary orders reviewed? Yes Do you have support at home? Yes   Home Care and Equipment/Supplies: Were home health services ordered? no If so, what is the name of the agency? N/a  Has the agency set up a time to come to the patient's home? not applicable Were any new equipment or medical supplies ordered?  No What is the name of the medical supply agency? N/a Were you able to get the supplies/equipment? not applicable   Functional Questionnaire: (I = Independent and D = Dependent) ADLs: I  Bathing/Dressing- I  Meal Prep- I  Eating- I  Maintaining continence- I  Transferring/Ambulation- I  Managing Meds- I  Follow up appointments reviewed:  PCP Hospital f/u appt confirmed? Yes  Scheduled to see Mackie Pai on 12/22/2020 @ 11:00. [facility] f/u appt confirmed?  N/a  Patient to f/u with GI on an as needed basis Are transportation arrangements needed? No  If their condition worsens, is the pt aware to call PCP or go to the Emergency Dept.? Yes Was the patient provided with contact information for the PCP's office or ED? Yes Was to pt encouraged to call back with questions or concerns? Yes

## 2020-12-17 ENCOUNTER — Encounter: Payer: Self-pay | Admitting: Gastroenterology

## 2020-12-22 ENCOUNTER — Other Ambulatory Visit: Payer: Self-pay

## 2020-12-22 ENCOUNTER — Encounter: Payer: Self-pay | Admitting: Medical

## 2020-12-22 ENCOUNTER — Ambulatory Visit (INDEPENDENT_AMBULATORY_CARE_PROVIDER_SITE_OTHER): Payer: Medicare HMO | Admitting: Medical

## 2020-12-22 VITALS — BP 137/88 | HR 63 | Temp 98.6°F | Ht 64.0 in | Wt 144.4 lb

## 2020-12-22 DIAGNOSIS — D649 Anemia, unspecified: Secondary | ICD-10-CM

## 2020-12-22 DIAGNOSIS — E119 Type 2 diabetes mellitus without complications: Secondary | ICD-10-CM

## 2020-12-22 LAB — COMPREHENSIVE METABOLIC PANEL
ALT: 16 U/L (ref 0–35)
AST: 22 U/L (ref 0–37)
Albumin: 4.1 g/dL (ref 3.5–5.2)
Alkaline Phosphatase: 68 U/L (ref 39–117)
BUN: 9 mg/dL (ref 6–23)
CO2: 27 mEq/L (ref 19–32)
Calcium: 9.3 mg/dL (ref 8.4–10.5)
Chloride: 104 mEq/L (ref 96–112)
Creatinine, Ser: 0.85 mg/dL (ref 0.40–1.20)
GFR: 63.61 mL/min (ref >60.00)
Glucose, Bld: 105 mg/dL — ABNORMAL HIGH (ref 70–99)
Potassium: 4.5 mEq/L (ref 3.5–5.1)
Sodium: 139 mEq/L (ref 135–145)
Total Bilirubin: 0.4 mg/dL (ref 0.2–1.2)
Total Protein: 6.4 g/dL (ref 6.0–8.3)

## 2020-12-22 LAB — CBC WITH DIFFERENTIAL/PLATELET
Basophils Absolute: 0 10*3/uL (ref 0.0–0.1)
Basophils Relative: 0.6 % (ref 0.0–3.0)
Eosinophils Absolute: 0.3 10*3/uL (ref 0.0–0.7)
Eosinophils Relative: 4.3 % (ref 0.0–5.0)
HCT: 30 % — ABNORMAL LOW (ref 36.0–46.0)
Hemoglobin: 9.7 g/dL — ABNORMAL LOW (ref 12.0–15.0)
Lymphocytes Relative: 30.8 % (ref 12.0–46.0)
Lymphs Abs: 2.2 10*3/uL (ref 0.7–4.0)
MCHC: 32.4 g/dL (ref 30.0–36.0)
MCV: 86.7 fl (ref 78.0–100.0)
Monocytes Absolute: 0.6 10*3/uL (ref 0.1–1.0)
Monocytes Relative: 8 % (ref 3.0–12.0)
Neutro Abs: 4 10*3/uL (ref 1.4–7.7)
Neutrophils Relative %: 56.3 % (ref 43.0–77.0)
Platelets: 238 10*3/uL (ref 150.0–400.0)
RBC: 3.46 Mil/uL — ABNORMAL LOW (ref 3.87–5.11)
RDW: 21.1 % — ABNORMAL HIGH (ref 11.5–15.5)
WBC: 7.1 10*3/uL (ref 4.0–10.5)

## 2020-12-22 LAB — IRON: Iron: 81 ug/dL (ref 42–145)

## 2020-12-22 NOTE — Progress Notes (Signed)
Subjective:    Patient ID: Natalie Swanson, female    DOB: Oct 05, 1935, 85 y.o.   MRN: [id number]  HPI  Follow up from the hospital.     Admit date: 12/12/2020 Discharge date: 12/15/2020   Time spent: 40 mins   Recommendations for Outpatient Follow-up:  PCP in 1 week with repeat labs GI as scheduled/needed  Discharge Diagnoses:      Active Hospital Problems    Diagnosis Date Noted   Severe anemia     Acute blood loss anemia 12/12/2020   GI bleed 12/12/2020   Slurred speech 12/12/2020   Vitamin D deficiency 09/14/2014   Type 2 diabetes mellitus with hemoglobin A1c goal of less than 7.0% ([place]) 07/18/2012   Hypertension      History of present illness:  Patient is 85 year old female with history of hypertension, hyperlipidemia, type 2 diabetes mellitus who came to the ED with concern for stroke.  She had developed left-sided numbness and slurred speech at home.  On presentation her neurological symptoms resolved.  She was alert and oriented on presentation and denies any left-sided weakness or word-finding difficulty on presentation.  She was hemodynamically stable.  Lab work showed hemoglobin of 4.8.  Code stroke was called in the emergency department, CT head did not show any acute intracranial abnormalities but showed chronic changes.  Neurology was also consulted and it was concluded that the neurological symptoms were due to transient ischemia secondary to severe symptomatic anemia.  No further neurological studies planned.  Admitted for further management.       Today, patient denies any new complaints, denies any further GI bleed, denies any chest pain, abdominal pain, nausea/vomiting, fever/chills, SOB.  Patient able to ambulate without a walker.  Follow-up with PCP in 1 week, GI as scheduled.    Hospital Course:  Active Problems:   Hypertension   Type 2 diabetes mellitus with hemoglobin A1c goal of less than 7.0% (HCC)   Vitamin D deficiency   Acute blood loss  anemia   GI bleed   Slurred speech   Severe anemia     Acute blood loss anemia/possible UGI bleed Hemoglobin was 4.8 on presentation.  FOBT was positive No report of hematochezia or melena, On ASA   Received 2 units of PRBC, hemoglobin has remained stable GI consulted, EGD w/ gastric atrophy o/w unremarkable. Colonoscopy with two polyps, diverticulosis, no etiology of bleed seen S/P IV iron Started p.o. PPI GI advised to hold ASA for 1 week, continue p.o. iron Follow-up with PCP with repeat labs   Left-sided weakness/slurred speech likely TIA Transient.  Code stroke was called on presentation CT head did not show any acute intracranial normalities but showed area of remote R MCA infarct.  No reported history of stroke. Neurology was consulted, no plan for further studies.  Her symptoms were attributed to severe symptomatic anemia   Hypertension Continue propanolol at home   Type 2 diabetes mellitus Patient hemoglobin A1c 7.3 as per 10/2020 Continue home regimen   Pt since discharge reports not feeling fatigue. States rare transient light headed. Before whe went to hospital had some dizziness.    Pt is taking iron tabs. Pt has not got called by GI MD yet.   Endoscopies below.  Hemorrhoids found on digital rectal exam. - The examined portion of the ileum was normal. - Three 2 to 4 mm polyps in the rectum and in the cecum, removed with a cold snare. Resected and retrieved. - Diverticulosis in the  recto-sigmoid colon, in the sigmoid colon and at the splenic flexure. - Normal mucosa in the entire examined colon otherwise. - Non-bleeding non-thrombosed external and internal hemorrhoids.   Normal esophagus. - Medium-sized hiatal hernia. - Gastric mucosal atrophy. Biopsied. - Normal duodenal bulb and second portion of the duodenum. Review of Systems     Objective:   Physical Exam  General Mental Status- Alert. General Appearance- Not in acute distress.    Skin General: Color- Normal Color. Moisture- Normal Moisture.   Chest and Lung Exam Auscultation: Breath Sounds:-Normal.  Cardiovascular Auscultation:Rythm- Regular. Murmurs & Other Heart Sounds:Auscultation of the heart reveals- No Murmurs.  Abdomen Inspection:-Inspeection Normal. Palpation/Percussion:Note:No mass. Palpation and Percussion of the abdomen reveal- Non Tender, Non Distended + BS, no rebound or guarding.    Neurologic Cranial Nerve exam:- CN III-XII intact(No nystagmus), symmetric smile. Strength:- 5/5 equal and symmetric strength both upper and lower extremities.       Assessment & Plan:  For recent severe anemia continue iron tab daily and will recheck your cbc and iron level today. I did place referral to GI MD as it is listed on DC summary.  Did cmp lab as well since your diabetic. Continue metformin.  Htn well controlled. Can continue propanolol.   Continue protonix prescirbed in the hospital.  Follow up as regularly scheduled with pcp or sooner if needed.

## 2020-12-22 NOTE — Patient Instructions (Addendum)
For recent severe anemia continue iron tab daily and will recheck your cbc and iron level today. I did place referral to GI MD as it is listed on DC summary.  Did cmp lab as well since your diabetic. Continue metformin.  Htn well controlled. Can continue propanolol.   Continue protonix prescirbed in the hospital.  Follow up as regularly scheduled with pcp or sooner if needed.

## 2021-01-21 ENCOUNTER — Ambulatory Visit: Payer: Medicare PPO | Admitting: *Deleted

## 2021-01-27 NOTE — Progress Notes (Signed)
Subjective:   Natalie Swanson is a 85 y.o. female who presents for Medicare Annual (Subsequent) preventive examination.  Review of Systems     Cardiac Risk Factors include: advanced age (>75mn, >>37women);diabetes mellitus;dyslipidemia;hypertension;sedentary lifestyle     Objective:    Today's Vitals   01/28/21 1010  BP: (!) 148/80  Pulse: (!) 55  Resp: 16  Temp: (!) 97.5 F (36.4 C)  TempSrc: Temporal  SpO2: 97%  Weight: 146 lb 6.4 oz (66.4 kg)  Height: '5\' 4"'  (1.626 m)   Body mass index is 25.13 kg/m.  Advanced Directives 01/28/2021 12/14/2020 12/13/2020 01/21/2020 01/17/2019 01/16/2018 11/09/2016  Does Patient Have a Medical Advance Directive? Yes No No Yes Yes Yes Yes  Type of AParamedicof ALazearLiving will Healthcare Power of AGarrisonLiving will HKerhonksonLiving will HEdin[place]Living will H[place]Living will Living will  Does patient want to make changes to medical advance directive? - - - No - Patient declined No - Patient declined - No - Patient declined  Copy of [place]in Chart? Yes - validated most recent copy scanned in chart (See row information) - No - copy requested Yes - validated most recent copy scanned in chart (See row information) Yes - validated most recent copy scanned in chart (See row information) Yes -  Would patient like information on creating a medical advance directive? - No - Patient declined - - - - -    Current Medications (verified) Outpatient Encounter Medications as of 01/28/2021  Medication Sig   Alcohol Swabs (B-D SINGLE USE SWABS REGULAR) PADS USE AS DIRECTED ONCE A DAY AND AS NEEDED   Blood Glucose Calibration (TRUE METRIX LEVEL 3) High SOLN Use as directed.   Blood Glucose Monitoring Suppl (TRUE METRIX METER) w/Device KIT USE AS DIRECTED ONE TIME DAILY  AND AS NEEDED   calamine lotion Apply 1 application  topically 4 (four) times daily as needed (for itching/irritated skin.).   Cholecalciferol (VITAMIN D3) 5000 UNITS CAPS Take 5,000 Units by mouth 2 (two) times daily. MORNING & AFTERNOON   metFORMIN (GLUCOPHAGE) 500 MG tablet Take 1 tablet (500 mg total) by mouth daily with breakfast.   Omega-3 Fatty Acids (FISH OIL) 1200 MG CAPS Take 1,200 mg by mouth daily.   propranolol (INDERAL) 40 MG tablet TAKE 1 TABLET TWICE DAILY   simvastatin (ZOCOR) 40 MG tablet TAKE 1 TABLET AT BEDTIME   TRUE METRIX BLOOD GLUCOSE TEST test strip USE AS DIRECTED ONE TIME DAILY  AND AS NEEDED   TRUEplus Lancets 33G MISC USE TO CHECK SUGAR ONCE A DAY AND AS NEEDED.   ferrous sulfate 325 (65 FE) MG tablet Take 1 tablet (325 mg total) by mouth daily with breakfast.   pantoprazole (PROTONIX) 40 MG tablet Take 1 tablet (40 mg total) by mouth daily at 6 (six) AM.   No facility-administered encounter medications on file as of 01/28/2021.    Allergies (verified) Sulfa antibiotics   History: Past Medical History:  Diagnosis Date   Abnormal liver function tests 04/03/2015   Atopic dermatitis 01/11/2012   Cataract 07/04/2006   b/l   Chicken pox as a child   DM type 2, goal A1c below 7 07/18/2012   Flashers or floaters, right eye 03/2011   floaters   History of shingles    History of tobacco abuse 01/11/2012   Hx of adenomatous colonic polyps 11/07/2014   Hyperglycemia 07/18/2012  Hyperlipidemia    Hypertension    Measles as a child   Medicare annual wellness visit, subsequent 04/17/2011   G4P1 s/p 1 svd, no abnormal papas, menarche at 52 menopause at 3, regular    Mumps as a child   Pain in joint, ankle and foot 04/27/2014   right   Shingles 03/2011   Vitamin D deficiency 09/14/2014   Past Surgical History:  Procedure Laterality Date   BIOPSY  12/13/2020   Procedure: BIOPSY;  Surgeon: Ladene Artist, MD;  Location: [facility] ENDOSCOPY;  Service: Endoscopy;;   BREAST LUMPECTOMY WITH RADIOACTIVE SEED LOCALIZATION  Left 11/16/2016   Procedure: LEFT BREAST LUMPECTOMY WITH RADIOACTIVE SEED LOCALIZATION;  Surgeon: Jovita Kussmaul, MD;  Location: [facility];  Service: General;  Laterality: Left;   CATARACT EXTRACTION W/ INTRAOCULAR LENS  IMPLANT, BILATERAL Bilateral 07-04-2006   CESAREAN SECTION  1981   X 1   COLONOSCOPY WITH PROPOFOL N/A 12/14/2020   Procedure: COLONOSCOPY WITH PROPOFOL;  Surgeon: Irving Copas., MD;  Location: [place];  Service: Gastroenterology;  Laterality: N/A;   ESOPHAGOGASTRODUODENOSCOPY (EGD) WITH PROPOFOL N/A 12/13/2020   Procedure: ESOPHAGOGASTRODUODENOSCOPY (EGD) WITH PROPOFOL;  Surgeon: Ladene Artist, MD;  Location[facility] ENDOSCOPY;  Service: Endoscopy;  Laterality: N/A;   POLYPECTOMY  12/14/2020   Procedure: POLYPECTOMY;  Surgeon: Mansouraty, Telford Nab., MD;  Location: [facility] ENDOSCOPY;  Service: Gastroenterology;;   Family History  Problem Relation Age of Onset   Hyperlipidemia Mother    Hypertension Mother    Heart disease Mother        MI   Heart attack Father    Diabetes Father        type 2   Cancer Father        type unknown   Heart attack Brother    Heart disease Brother 39       MI   Hemophilia Son        x 2   Hypertension Son    Colon cancer Neg Hx    Stomach cancer Neg Hx    Social History   Socioeconomic History   Marital status: Widowed    Spouse name: Not on file   Number of children: 4   Years of education: Not on file   Highest education level: Not on file  Occupational History   Occupation: retired  Tobacco Use   Smoking status: Former    Packs/day: 0.20    Years: 61.00    Pack years: 12.20    Types: Cigarettes   Smokeless tobacco: Never   Tobacco comments:    uses e cigs ; quit smoking cigarettes 2013.  Substance and Sexual Activity   Alcohol use: No    Alcohol/week: 0.0 standard drinks   Drug use: No   Sexual activity: Yes  Other Topics Concern   Not on file  Social History Narrative   Not on file   Social Determinants of  Health   Financial Resource Strain: Low Risk    Difficulty of Paying Living Expenses: Not hard at all  Food Insecurity: No Food Insecurity   Worried About Charity fundraiser in the Last Year: Never true   [place] in the Last Year: Never true  Transportation Needs: No Transportation Needs   Lack of Transportation (Medical): No   Lack of Transportation (Non-Medical): No  Physical Activity: Inactive   Days of Exercise per Week: 0 days   Minutes of Exercise per Session: 0 min  Stress: No  Stress Concern Present   Feeling of Stress : Not at all  Social Connections: Socially Isolated   Frequency of Communication with Friends and Family: More than three times a week   Frequency of Social Gatherings with Friends and Family: More than three times a week   Attends Religious Services: Never   Marine scientist or Organizations: No   Attends Archivist Meetings: Never   Marital Status: Widowed    Tobacco Counseling Counseling given: Not Answered Tobacco comments: uses e cigs ; quit smoking cigarettes 2013.   Clinical Intake:  Pre-visit preparation completed: Yes  Pain : No/denies pain     Nutritional Status: BMI 25 -29 Overweight Nutritional Risks: None Diabetes: Yes CBG done?: No Did pt. bring in CBG monitor from home?: No  How often do you need to have someone help you when you read instructions, pamphlets, or other written materials from your doctor or pharmacy?: 1 - Never  Diabetes:  Is the patient diabetic?  Yes  If diabetic, was a CBG obtained today?  No  Did the patient bring in their glucometer from home?  No  How often do you monitor your CBG's? occasionally.   Financial Strains and Diabetes Management:  Are you having any financial strains with the device, your supplies or your medication? No .  Does the patient want to be seen by Chronic Care Management for management of their diabetes?  No  Would the patient like to be referred to a  Nutritionist or for Diabetic Management?  No   Diabetic Exams:  Diabetic Eye Exam: . Overdue for diabetic eye exam. Pt has been advised about the importance in completing this exam. Patient advised to make an appt  Diabetic Foot Exam: Pt has been advised about the importance in completing this exam. To be completed by PCP.Marland Kitchen    Interpreter Needed?: No  Information entered by :: Caroleen Hamman LPN   Activities of Daily Living In your present state of health, do you have any difficulty performing the following activities: 01/28/2021 12/13/2020  Hearing? N N  Vision? N N  Difficulty concentrating or making decisions? N N  Walking or climbing stairs? N N  Dressing or bathing? N N  Doing errands, shopping? N N  Preparing Food and eating ? N -  Using the Toilet? N -  In the past six months, have you accidently leaked urine? Y -  Comment occasionally -  Do you have problems with loss of bowel control? N -  Managing your Medications? N -  Managing your Finances? N -  Housekeeping or managing your Housekeeping? N -  Some recent data might be hidden    Patient Care Team: Mosie Lukes, MD as PCP - General (Family Medicine)  Indicate any recent Medical Services you may have received from other than [facility] providers in the past year (date may be approximate).     Assessment:   This is a routine wellness examination for [place].  Hearing/Vision screen Hearing Screening - Comments:: No hearing loss Vision Screening - Comments:: Reading glasses Last eye exam-2-3 years ago  Dietary issues and exercise activities discussed: Current Exercise Habits: The patient does not participate in regular exercise at present, Exercise limited by: None identified   Goals Addressed               This Visit's Progress     Patient Stated     Remain active and independent. (pt-stated)   On track  Healthy diet, exercise, and regular check up with PCP.          Depression Screen PHQ 2/9  Scores 01/28/2021 01/21/2020 01/17/2019 01/16/2018 07/28/2016 06/25/2015 04/18/2014  PHQ - 2 Score 0 0 0 0 0 0 0    Fall Risk Fall Risk  01/28/2021 01/21/2020 01/17/2019 01/16/2018 07/28/2016  Falls in the past year? 0 0 0 No No  Number falls in past yr: 0 0 - - -  Injury with Fall? 0 0 - - -  Follow up Falls prevention discussed Education provided;Falls prevention discussed - - -    FALL RISK PREVENTION PERTAINING TO THE HOME:  Any stairs in or around the home? No  Home free of loose throw rugs in walkways, pet beds, electrical cords, etc? Yes  Adequate lighting in your home to reduce risk of falls? Yes   ASSISTIVE DEVICES UTILIZED TO PREVENT FALLS:  Life alert? No  Use of a cane, walker or w/c? Yes walker occasionally Grab bars in the bathroom? No  Shower chair or bench in shower? Yes  Elevated toilet seat or a handicapped toilet? No   TIMED UP AND GO:  Was the test performed? Yes .  Length of time to ambulate 10 feet: 11 sec.   Gait slow and steady without use of assistive device  Cognitive Function: MMSE - Mini Mental State Exam 07/28/2016 06/25/2015  Orientation to time 5 5  Orientation to Place 5 5  Registration 3 3  Attention/ Calculation 5 5  Recall 3 3  Language- name 2 objects 2 2  Language- repeat 1 1  Language- follow 3 step command 3 3  Language- read & follow direction 1 1  Write a sentence 1 1  Copy design 1 1  Total score 30 30        Immunizations Immunization History  Administered Date(s) Administered   Influenza Split 04/06/2011, 07/18/2012   Influenza, High Dose Seasonal PF 06/08/2017   Influenza,inj,Quad PF,6+ Mos 03/13/2013, 04/18/2014, 03/27/2015   Pneumococcal Conjugate-13 07/26/2013   Pneumococcal Polysaccharide-23 06/25/2015   Tdap 04/06/2011    TDAP status: Up to date  Flu Vaccine status: Up to date  Pneumococcal vaccine status: Up to date  Covid-19 vaccine status: Declined, Education has been provided regarding the importance of this  vaccine but patient still declined. Advised may receive this vaccine at local pharmacy or Health Dept.or vaccine clinic. Aware to provide a copy of the vaccination record if obtained from local pharmacy or Health Dept. Verbalized acceptance and understanding.  Qualifies for Shingles Vaccine? Yes   Zostavax completed No   Shingrix Completed?: No.    Education has been provided regarding the importance of this vaccine. Patient has been advised to call insurance company to determine out of pocket expense if they have not yet received this vaccine. Advised may also receive vaccine at local pharmacy or Health Dept. Verbalized acceptance and understanding.  Screening Tests Health Maintenance  Topic Date Due   Zoster Vaccines- Shingrix (1 of 2) Never done   OPHTHALMOLOGY EXAM  02/08/2017   FOOT EXAM  07/28/2017   URINE MICROALBUMIN  07/28/2017   COVID-19 Vaccine (1) 11/12/2021 (Originally 02/13/1941)   INFLUENZA VACCINE  02/01/2021   TETANUS/TDAP  04/05/2021   HEMOGLOBIN A1C  04/28/2021   DEXA SCAN  Completed   PNA vac Low Risk Adult  Completed   HPV VACCINES  Aged Out    Health Maintenance  Health Maintenance Due  Topic Date Due   Zoster Vaccines- Shingrix (  1 of 2) Never done   OPHTHALMOLOGY EXAM  02/08/2017   FOOT EXAM  07/28/2017   URINE MICROALBUMIN  07/28/2017    Colorectal cancer screening: No longer required.   Mammogram status: Declined  Bone Density status: Declined  Lung Cancer Screening: (Low Dose CT Chest recommended if Age 75-80 years, 30 pack-year currently smoking OR have quit w/in 15years.) does not qualify.     Additional Screening:  Hepatitis C Screening: does not qualify  Vision Screening: Recommended annual ophthalmology exams for early detection of glaucoma and other disorders of the eye. Is the patient up to date with their annual eye exam?  No  Who is the provider or what is the name of the office in which the patient attends annual eye exams? Unsure of  name   Dental Screening: Recommended annual dental exams for proper oral hygiene  Community Resource Referral / Chronic Care Management: CRR required this visit?  No   CCM required this visit?  No      Plan:     I have personally reviewed and noted the following in the patient's chart:   Medical and social history Use of alcohol, tobacco or illicit drugs  Current medications and supplements including opioid prescriptions.  Functional ability and status Nutritional status Physical activity Advanced directives List of other physicians Hospitalizations, surgeries, and ER visits in previous 12 months Vitals Screenings to include cognitive, depression, and falls Referrals and appointments  In addition, I have reviewed and discussed with patient certain preventive protocols, quality metrics, and best practice recommendations. A written personalized care plan for preventive services as well as general preventive health recommendations were provided to patient.     Marta Antu, LPN   5/67/1640  Nurse Health Advisor  Nurse Notes: Patient needs a refill of Ferrous Sulfate. She is also requesting a handicap placard. Message sent to PCP.

## 2021-01-28 ENCOUNTER — Other Ambulatory Visit: Payer: Self-pay

## 2021-01-28 ENCOUNTER — Telehealth: Payer: Self-pay

## 2021-01-28 ENCOUNTER — Ambulatory Visit (INDEPENDENT_AMBULATORY_CARE_PROVIDER_SITE_OTHER): Payer: Medicare HMO

## 2021-01-28 VITALS — BP 148/80 | HR 55 | Temp 97.5°F | Resp 16 | Ht 64.0 in | Wt 146.4 lb

## 2021-01-28 DIAGNOSIS — Z0000 Encounter for general adult medical examination without abnormal findings: Secondary | ICD-10-CM

## 2021-01-28 NOTE — Telephone Encounter (Signed)
Patient is requesting a refill of Ferrous Sulfate & she also needs to know when she needs to have lab work done again (cbc/iron). She is also requesting a handicap placard. States she has vertigo at times & it is difficult for her to walk long distances.

## 2021-01-28 NOTE — Patient Instructions (Signed)
Natalie Swanson , Thank you for taking time to come for your Medicare Wellness Visit. I appreciate your ongoing commitment to your health goals. Please review the following plan we discussed and let me know if I can assist you in the future.   Screening recommendations/referrals: Colonoscopy: No longer required Mammogram: Declined-Please call the office to schedule if you change your mind Bone Density: Declined-Please call the office to schedule if you change your mind Recommended yearly ophthalmology/optometry visit for glaucoma screening and checkup Recommended yearly dental visit for hygiene and checkup  Vaccinations: Influenza vaccine: Due 03/2021 Pneumococcal vaccine: Up to date Tdap vaccine: Up to date-Due 04/05/2021 Shingles vaccine: Discuss with pharmacy   Covid-19:Declined  Advanced directives: Copy in chart  Conditions/risks identified: See problem list  Next appointment: Follow up in one year for your annual wellness visit    Preventive Care 65 Years and Older, Female Preventive care refers to lifestyle choices and visits with your health care provider that can promote health and wellness. What does preventive care include? A yearly physical exam. This is also called an annual well check. Dental exams once or twice a year. Routine eye exams. Ask your health care provider how often you should have your eyes checked. Personal lifestyle choices, including: Daily care of your teeth and gums. Regular physical activity. Eating a healthy diet. Avoiding tobacco and drug use. Limiting alcohol use. Practicing safe sex. Taking low-dose aspirin every day. Taking vitamin and mineral supplements as recommended by your health care provider. What happens during an annual well check? The services and screenings done by your health care provider during your annual well check will depend on your age, overall health, lifestyle risk factors, and family history of disease. Counseling  Your  health care provider may ask you questions about your: Alcohol use. Tobacco use. Drug use. Emotional well-being. Home and relationship well-being. Sexual activity. Eating habits. History of falls. Memory and ability to understand (cognition). Work and work Statistician. Reproductive health. Screening  You may have the following tests or measurements: Height, weight, and BMI. Blood pressure. Lipid and cholesterol levels. These may be checked every 5 years, or more frequently if you are over 29 years old. Skin check. Lung cancer screening. You may have this screening every year starting at age 77 if you have a 30-pack-year history of smoking and currently smoke or have quit within the past 15 years. Fecal occult blood test (FOBT) of the stool. You may have this test every year starting at age 45. Flexible sigmoidoscopy or colonoscopy. You may have a sigmoidoscopy every 5 years or a colonoscopy every 10 years starting at age 70. Hepatitis C blood test. Hepatitis B blood test. Sexually transmitted disease (STD) testing. Diabetes screening. This is done by checking your blood sugar (glucose) after you have not eaten for a while (fasting). You may have this done every 1-3 years. Bone density scan. This is done to screen for osteoporosis. You may have this done starting at age 95. Mammogram. This may be done every 1-2 years. Talk to your health care provider about how often you should have regular mammograms. Talk with your health care provider about your test results, treatment options, and if necessary, the need for more tests. Vaccines  Your health care provider may recommend certain vaccines, such as: Influenza vaccine. This is recommended every year. Tetanus, diphtheria, and acellular pertussis (Tdap, Td) vaccine. You may need a Td booster every 10 years. Zoster vaccine. You may need this after age 41. Pneumococcal  13-valent conjugate (PCV13) vaccine. One dose is recommended after age  60. Pneumococcal polysaccharide (PPSV23) vaccine. One dose is recommended after age 38. Talk to your health care provider about which screenings and vaccines you need and how often you need them. This information is not intended to replace advice given to you by your health care provider. Make sure you discuss any questions you have with your health care provider. Document Released: 07/17/2015 Document Revised: 03/09/2016 Document Reviewed: 04/21/2015 Elsevier Interactive Patient Education  2017 [employer] Prevention in the Home Falls can cause injuries. They can happen to people of all ages. There are many things you can do to make your home safe and to help prevent falls. What can I do on the outside of my home? Regularly fix the edges of walkways and driveways and fix any cracks. Remove anything that might make you trip as you walk through a door, such as a raised step or threshold. Trim any bushes or trees on the path to your home. Use bright outdoor lighting. Clear any walking paths of anything that might make someone trip, such as rocks or tools. Regularly check to see if handrails are loose or broken. Make sure that both sides of any steps have handrails. Any raised decks and porches should have guardrails on the edges. Have any leaves, snow, or ice cleared regularly. Use sand or salt on walking paths during winter. Clean up any spills in your garage right away. This includes oil or grease spills. What can I do in the bathroom? Use night lights. Install grab bars by the toilet and in the tub and shower. Do not use towel bars as grab bars. Use non-skid mats or decals in the tub or shower. If you need to sit down in the shower, use a plastic, non-slip stool. Keep the floor dry. Clean up any water that spills on the floor as soon as it happens. Remove soap buildup in the tub or shower regularly. Attach bath mats securely with double-sided non-slip rug tape. Do not have throw  rugs and other things on the floor that can make you trip. What can I do in the bedroom? Use night lights. Make sure that you have a light by your bed that is easy to reach. Do not use any sheets or blankets that are too big for your bed. They should not hang down onto the floor. Have a firm chair that has side arms. You can use this for support while you get dressed. Do not have throw rugs and other things on the floor that can make you trip. What can I do in the kitchen? Clean up any spills right away. Avoid walking on wet floors. Keep items that you use a lot in easy-to-reach places. If you need to reach something above you, use a strong step stool that has a grab bar. Keep electrical cords out of the way. Do not use floor polish or wax that makes floors slippery. If you must use wax, use non-skid floor wax. Do not have throw rugs and other things on the floor that can make you trip. What can I do with my stairs? Do not leave any items on the stairs. Make sure that there are handrails on both sides of the stairs and use them. Fix handrails that are broken or loose. Make sure that handrails are as long as the stairways. Check any carpeting to make sure that it is firmly attached to the stairs. Fix any carpet  that is loose or worn. Avoid having throw rugs at the top or bottom of the stairs. If you do have throw rugs, attach them to the floor with carpet tape. Make sure that you have a light switch at the top of the stairs and the bottom of the stairs. If you do not have them, ask someone to add them for you. What else can I do to help prevent falls? Wear shoes that: Do not have high heels. Have rubber bottoms. Are comfortable and fit you well. Are closed at the toe. Do not wear sandals. If you use a stepladder: Make sure that it is fully opened. Do not climb a closed stepladder. Make sure that both sides of the stepladder are locked into place. Ask someone to hold it for you, if  possible. Clearly mark and make sure that you can see: Any grab bars or handrails. First and last steps. Where the edge of each step is. Use tools that help you move around (mobility aids) if they are needed. These include: Canes. Walkers. Scooters. Crutches. Turn on the lights when you go into a dark area. Replace any light bulbs as soon as they burn out. Set up your furniture so you have a clear path. Avoid moving your furniture around. If any of your floors are uneven, fix them. If there are any pets around you, be aware of where they are. Review your medicines with your doctor. Some medicines can make you feel dizzy. This can increase your chance of falling. Ask your doctor what other things that you can do to help prevent falls. This information is not intended to replace advice given to you by your health care provider. Make sure you discuss any questions you have with your health care provider. Document Released: 04/16/2009 Document Revised: 11/26/2015 Document Reviewed: 07/25/2014 Elsevier Interactive Patient Education  2017 [employer].

## 2021-01-29 NOTE — Telephone Encounter (Signed)
Patient has f/u on 10/24.  She was last seen 10/2020.  Appointment made for lab only appt before lab only appt on 10/17.  Also pt will pickup handicap form on Monday.

## 2021-04-15 ENCOUNTER — Telehealth: Payer: Self-pay | Admitting: *Deleted

## 2021-04-15 DIAGNOSIS — E785 Hyperlipidemia, unspecified: Secondary | ICD-10-CM

## 2021-04-15 DIAGNOSIS — E559 Vitamin D deficiency, unspecified: Secondary | ICD-10-CM

## 2021-04-15 DIAGNOSIS — D649 Anemia, unspecified: Secondary | ICD-10-CM

## 2021-04-15 DIAGNOSIS — E119 Type 2 diabetes mellitus without complications: Secondary | ICD-10-CM

## 2021-04-15 DIAGNOSIS — I10 Essential (primary) hypertension: Secondary | ICD-10-CM

## 2021-04-15 NOTE — Telephone Encounter (Signed)
Pt has lab appointment on Monday but I do not see any future orders in Epic. There is a result note from [place] on 12/22/20 saying she needed to repeat CBC labs in 2 months.  Pt also has appt with PCP on 04/26/21.  Please place any lab orders that are appropriate or call pt to cancel if labs are not needed at this time.

## 2021-04-16 NOTE — Telephone Encounter (Signed)
Orders are in

## 2021-04-19 ENCOUNTER — Other Ambulatory Visit: Payer: Self-pay

## 2021-04-19 ENCOUNTER — Other Ambulatory Visit (INDEPENDENT_AMBULATORY_CARE_PROVIDER_SITE_OTHER): Payer: Medicare HMO

## 2021-04-19 DIAGNOSIS — D649 Anemia, unspecified: Secondary | ICD-10-CM

## 2021-04-19 DIAGNOSIS — E559 Vitamin D deficiency, unspecified: Secondary | ICD-10-CM

## 2021-04-19 DIAGNOSIS — E119 Type 2 diabetes mellitus without complications: Secondary | ICD-10-CM | POA: Diagnosis not present

## 2021-04-19 DIAGNOSIS — E785 Hyperlipidemia, unspecified: Secondary | ICD-10-CM | POA: Diagnosis not present

## 2021-04-19 DIAGNOSIS — I10 Essential (primary) hypertension: Secondary | ICD-10-CM

## 2021-04-19 LAB — HEMOGLOBIN A1C: Hgb A1c MFr Bld: 6.8 % — ABNORMAL HIGH (ref 4.6–6.5)

## 2021-04-19 LAB — COMPREHENSIVE METABOLIC PANEL
ALT: 17 U/L (ref 0–35)
AST: 24 U/L (ref 0–37)
Albumin: 4.3 g/dL (ref 3.5–5.2)
Alkaline Phosphatase: 78 U/L (ref 39–117)
BUN: 13 mg/dL (ref 6–23)
CO2: 28 mEq/L (ref 19–32)
Calcium: 9.9 mg/dL (ref 8.4–10.5)
Chloride: 102 mEq/L (ref 96–112)
Creatinine, Ser: 0.88 mg/dL (ref 0.40–1.20)
GFR: 60.88 mL/min (ref >60.00)
Glucose, Bld: 111 mg/dL — ABNORMAL HIGH (ref 70–99)
Potassium: 4.7 mEq/L (ref 3.5–5.1)
Sodium: 137 mEq/L (ref 135–145)
Total Bilirubin: 0.7 mg/dL (ref 0.2–1.2)
Total Protein: 6.8 g/dL (ref 6.0–8.3)

## 2021-04-19 LAB — TSH: TSH: 3.27 u[IU]/mL (ref 0.35–5.50)

## 2021-04-19 LAB — LIPID PANEL
Cholesterol: 155 mg/dL (ref 0–200)
HDL: 52.7 mg/dL (ref >39.00)
LDL Cholesterol: 77 mg/dL (ref 0–99)
NonHDL: 102.47
Total CHOL/HDL Ratio: 3
Triglycerides: 129 mg/dL (ref 0.0–149.0)
VLDL: 25.8 mg/dL (ref 0.0–40.0)

## 2021-04-19 LAB — CBC
HCT: 40.1 % (ref 36.0–46.0)
Hemoglobin: 13.1 g/dL (ref 12.0–15.0)
MCHC: 32.7 g/dL (ref 30.0–36.0)
MCV: 91.5 fl (ref 78.0–100.0)
Platelets: 203 10*3/uL (ref 150.0–400.0)
RBC: 4.38 Mil/uL (ref 3.87–5.11)
RDW: 14.4 % (ref 11.5–15.5)
WBC: 6.4 10*3/uL (ref 4.0–10.5)

## 2021-04-19 LAB — VITAMIN D 25 HYDROXY (VIT D DEFICIENCY, FRACTURES): VITD: 45.41 ng/mL (ref 30.00–100.00)

## 2021-04-26 ENCOUNTER — Other Ambulatory Visit: Payer: Self-pay

## 2021-04-26 ENCOUNTER — Encounter: Payer: Self-pay | Admitting: Family Medicine

## 2021-04-26 ENCOUNTER — Ambulatory Visit (INDEPENDENT_AMBULATORY_CARE_PROVIDER_SITE_OTHER): Payer: Medicare HMO | Admitting: Family Medicine

## 2021-04-26 VITALS — BP 110/68 | HR 61 | Temp 98.0°F | Resp 16 | Wt 142.8 lb

## 2021-04-26 DIAGNOSIS — E119 Type 2 diabetes mellitus without complications: Secondary | ICD-10-CM | POA: Diagnosis not present

## 2021-04-26 DIAGNOSIS — E559 Vitamin D deficiency, unspecified: Secondary | ICD-10-CM

## 2021-04-26 DIAGNOSIS — E785 Hyperlipidemia, unspecified: Secondary | ICD-10-CM

## 2021-04-26 DIAGNOSIS — Z0000 Encounter for general adult medical examination without abnormal findings: Secondary | ICD-10-CM | POA: Diagnosis not present

## 2021-04-26 DIAGNOSIS — Z23 Encounter for immunization: Secondary | ICD-10-CM | POA: Diagnosis not present

## 2021-04-26 DIAGNOSIS — I10 Essential (primary) hypertension: Secondary | ICD-10-CM

## 2021-04-26 NOTE — Assessment & Plan Note (Signed)
Well controlled, no changes to meds. Encouraged heart healthy diet such as the DASH diet and exercise as tolerated.  °

## 2021-04-26 NOTE — Progress Notes (Signed)
Subjective:   By signing my name below, I, Zite Okoli, attest that this documentation has been prepared under the direction and in the presence of Mosie Lukes, MD. 04/26/2021   Natalie Swanson ID: Natalie Swanson, female    DOB: Sep 10, 1935, 85 y.o.   MRN: [id number]  Chief Complaint  Natalie Swanson presents with   Annual Exam    HPI Natalie Swanson is in today for a comprehensive physical exam.  Natalie Swanson is doing well at this time.  Natalie Swanson was admitted to the hospital in June 2022 for GI bleeding and has had no episodes of bleeding or bowel problems since then.  Natalie Swanson checks her sugar levels at home and mentions they always range around 110.  Natalie Swanson denies fever, congestion, eye pain, chest pain, palpitations, leg swelling, shortness of breath, nausea, abdominal pain, diarrhea and blood in stool. Also denies dysuria, frequency, back pain and headaches.   Natalie Swanson is not UTD on vision care. Natalie Swanson has had no vision or hearing problems.  Natalie Swanson received the flu vaccine today.Natalie Swanson is not UTD on the shingles and tetanus vaccines. Natalie Swanson is not interested in the Covid-19 vaccines.   There has been no recent changes in her family medical history.  Past Medical History:  Diagnosis Date   Abnormal liver function tests 04/03/2015   Atopic dermatitis 01/11/2012   Cataract 07/04/2006   b/l   Chicken pox as a child   DM type 2, goal A1c below 7 07/18/2012   Flashers or floaters, right eye 03/2011   floaters   History of shingles    History of tobacco abuse 01/11/2012   Hx of adenomatous colonic polyps 11/07/2014   Hyperglycemia 07/18/2012   Hyperlipidemia    Hypertension    Measles as a child   Medicare annual wellness visit, subsequent 04/17/2011   G4P1 s/p 1 svd, no abnormal papas, menarche at 30 menopause at 48, regular    Mumps as a child   Pain in joint, ankle and foot 04/27/2014   right   Shingles 03/2011   Vitamin D deficiency 09/14/2014    Past Surgical History:  Procedure Laterality Date   BIOPSY   12/13/2020   Procedure: BIOPSY;  Surgeon: Ladene Artist, MD;  Location: [facility];  Service: Endoscopy;;   BREAST LUMPECTOMY WITH RADIOACTIVE SEED LOCALIZATION Left 11/16/2016   Procedure: LEFT BREAST LUMPECTOMY WITH RADIOACTIVE SEED LOCALIZATION;  Surgeon: Jovita Kussmaul, MD;  Location: [facility];  Service: General;  Laterality: Left;   CATARACT EXTRACTION W/ INTRAOCULAR LENS  IMPLANT, BILATERAL Bilateral 07-04-2006   CESAREAN SECTION  1981   X 1   COLONOSCOPY WITH PROPOFOL N/A 12/14/2020   Procedure: COLONOSCOPY WITH PROPOFOL;  Surgeon: Irving Copas., MD;  Location[facility] ENDOSCOPY;  Service: Gastroenterology;  Laterality: N/A;   ESOPHAGOGASTRODUODENOSCOPY (EGD) WITH PROPOFOL N/A 12/13/2020   Procedure: ESOPHAGOGASTRODUODENOSCOPY (EGD) WITH PROPOFOL;  Surgeon: Ladene Artist, MD;  Location: [facility] ENDOSCOPY;  Service: Endoscopy;  Laterality: N/A;   POLYPECTOMY  12/14/2020   Procedure: POLYPECTOMY;  Surgeon: Mansouraty, Telford Nab., MD;  Location: [facility] ENDOSCOPY;  Service: Gastroenterology;;    Family History  Problem Relation Age of Onset   Hyperlipidemia Mother    Hypertension Mother    Heart disease Mother        MI   Heart attack Father    Diabetes Father        type 2   Cancer Father        type unknown   Heart attack Brother  Heart disease Brother 27       MI   Hemophilia Son        x 2   Hypertension Son    Colon cancer Neg Hx    Stomach cancer Neg Hx     Social History   Socioeconomic History   Marital status: Widowed    Spouse name: Not on file   Number of children: 4   Years of education: Not on file   Highest education level: Not on file  Occupational History   Occupation: retired  Tobacco Use   Smoking status: Former    Packs/day: 0.20    Years: 61.00    Pack years: 12.20    Types: Cigarettes   Smokeless tobacco: Never   Tobacco comments:    uses e cigs ; quit smoking cigarettes 2013.  Substance and Sexual Activity   Alcohol use: No     Alcohol/week: 0.0 standard drinks   Drug use: No   Sexual activity: Yes  Other Topics Concern   Not on file  Social History Narrative   Not on file   Social Determinants of Health   Financial Resource Strain: Low Risk    Difficulty of Paying Living Expenses: Not hard at all  Food Insecurity: No Food Insecurity   Worried About Charity fundraiser in the Last Year: Never true   [place] in the Last Year: Never true  Transportation Needs: No Transportation Needs   Lack of Transportation (Medical): No   Lack of Transportation (Non-Medical): No  Physical Activity: Inactive   Days of Exercise per Week: 0 days   Minutes of Exercise per Session: 0 min  Stress: No Stress Concern Present   Feeling of Stress : Not at all  Social Connections: Socially Isolated   Frequency of Communication with Friends and Family: More than three times a week   Frequency of Social Gatherings with Friends and Family: More than three times a week   Attends Religious Services: Never   Marine scientist or Organizations: No   Attends Archivist Meetings: Never   Marital Status: Widowed  Human resources officer Violence: Not At Risk   Fear of Current or Ex-Partner: No   Emotionally Abused: No   Physically Abused: No   Sexually Abused: No    Outpatient Medications Prior to Visit  Medication Sig Dispense Refill   Alcohol Swabs (B-D SINGLE USE SWABS REGULAR) PADS USE AS DIRECTED ONCE A DAY AND AS NEEDED 200 each 1   Blood Glucose Calibration (TRUE METRIX LEVEL 3) High SOLN Use as directed. 1 each 0   Blood Glucose Monitoring Suppl (TRUE METRIX METER) w/Device KIT USE AS DIRECTED ONE TIME DAILY  AND AS NEEDED 1 kit 0   calamine lotion Apply 1 application topically 4 (four) times daily as needed (for itching/irritated skin.).     Cholecalciferol (VITAMIN D3) 5000 UNITS CAPS Take 5,000 Units by mouth 2 (two) times daily. MORNING & AFTERNOON     metFORMIN (GLUCOPHAGE) 500 MG tablet Take 1 tablet  (500 mg total) by mouth daily with breakfast. 90 tablet 3   Omega-3 Fatty Acids (FISH OIL) 1200 MG CAPS Take 1,200 mg by mouth daily.     propranolol (INDERAL) 40 MG tablet TAKE 1 TABLET TWICE DAILY 180 tablet 2   simvastatin (ZOCOR) 40 MG tablet TAKE 1 TABLET AT BEDTIME 90 tablet 2   TRUE METRIX BLOOD GLUCOSE TEST test strip USE AS DIRECTED ONE TIME DAILY  AND AS NEEDED 200 strip 1   TRUEplus Lancets 33G MISC USE TO CHECK SUGAR ONCE A DAY AND AS NEEDED. 200 each 1   ferrous sulfate 325 (65 FE) MG tablet Take 1 tablet (325 mg total) by mouth daily with breakfast. 30 tablet 0   pantoprazole (PROTONIX) 40 MG tablet Take 1 tablet (40 mg total) by mouth daily at 6 (six) AM. 30 tablet 0   No facility-administered medications prior to visit.    Allergies  Allergen Reactions   Sulfa Antibiotics Rash    Review of Systems  Constitutional:  Negative for fever.  HENT:  Negative for congestion.   Eyes:  Negative for pain.  Respiratory:  Negative for shortness of breath.   Cardiovascular:  Negative for chest pain, palpitations and leg swelling.  Gastrointestinal:  Negative for abdominal pain, blood in stool, diarrhea and nausea.  Genitourinary:  Negative for dysuria and frequency.  Musculoskeletal:  Negative for back pain.  Neurological:  Negative for headaches.      Objective:    Physical Exam Constitutional:      General: Natalie Swanson is not in acute distress.    Appearance: Natalie Swanson is well-developed.  HENT:     Head: Normocephalic and atraumatic.     Right Ear: Tympanic membrane, ear canal and external ear normal.     Left Ear: Tympanic membrane, ear canal and external ear normal.  Eyes:     Conjunctiva/sclera: Conjunctivae normal.     Comments: No nystagmus  Neck:     Thyroid: No thyromegaly.  Cardiovascular:     Rate and Rhythm: Normal rate and regular rhythm.     Heart sounds: Normal heart sounds. No murmur heard. Pulmonary:     Effort: Pulmonary effort is normal. No respiratory distress.      Breath sounds: Normal breath sounds.  Abdominal:     General: Bowel sounds are normal. There is no distension.     Palpations: Abdomen is soft. There is no mass.     Tenderness: There is no abdominal tenderness.  Musculoskeletal:     Cervical back: Neck supple.     Comments: 5/5/ strength in upper and lower extremities  Feet:     Comments: Diabetic Foot Exam - Simple   No data filed    Lymphadenopathy:     Cervical: No cervical adenopathy.  Skin:    General: Skin is warm and dry.  Neurological:     Mental Status: Natalie Swanson is alert and oriented to person, place, and time.     Deep Tendon Reflexes:     Reflex Scores:      Patellar reflexes are 2+ on the right side and 2+ on the left side. Psychiatric:        Mood and Affect: Mood normal.        Behavior: Behavior normal.    BP 110/68   Pulse 61   Temp 98 F (36.7 C)   Resp 16   Wt 142 lb 12.8 oz (64.8 kg)   SpO2 94%   BMI 24.51 kg/m  Wt Readings from Last 3 Encounters:  04/26/21 142 lb 12.8 oz (64.8 kg)  01/28/21 146 lb 6.4 oz (66.4 kg)  12/22/20 144 lb 6.4 oz (65.5 kg)    Diabetic Foot Exam - Simple   Simple Foot Form Diabetic Foot exam was performed with the following findings: Yes 04/26/2021  3:14 PM  Visual Inspection No deformities, no ulcerations, no other skin breakdown bilaterally: Yes Sensation Testing Intact to touch and  monofilament testing bilaterally: Yes Pulse Check Posterior Tibialis and Dorsalis pulse intact bilaterally: Yes Comments    Lab Results  Component Value Date   WBC 6.4 04/19/2021   HGB 13.1 04/19/2021   HCT 40.1 04/19/2021   PLT 203.0 04/19/2021   GLUCOSE 111 (H) 04/19/2021   CHOL 155 04/19/2021   TRIG 129.0 04/19/2021   HDL 52.70 04/19/2021   LDLDIRECT 68.0 06/05/2017   LDLCALC 77 04/19/2021   ALT 17 04/19/2021   AST 24 04/19/2021   NA 137 04/19/2021   K 4.7 04/19/2021   CL 102 04/19/2021   CREATININE 0.88 04/19/2021   BUN 13 04/19/2021   CO2 28 04/19/2021   TSH 3.27  04/19/2021   INR 1.0 12/12/2020   HGBA1C 6.8 (H) 04/19/2021   MICROALBUR <0.7 07/28/2016    Lab Results  Component Value Date   TSH 3.27 04/19/2021   Lab Results  Component Value Date   WBC 6.4 04/19/2021   HGB 13.1 04/19/2021   HCT 40.1 04/19/2021   MCV 91.5 04/19/2021   PLT 203.0 04/19/2021   Lab Results  Component Value Date   NA 137 04/19/2021   K 4.7 04/19/2021   CO2 28 04/19/2021   GLUCOSE 111 (H) 04/19/2021   BUN 13 04/19/2021   CREATININE 0.88 04/19/2021   BILITOT 0.7 04/19/2021   ALKPHOS 78 04/19/2021   AST 24 04/19/2021   ALT 17 04/19/2021   PROT 6.8 04/19/2021   ALBUMIN 4.3 04/19/2021   CALCIUM 9.9 04/19/2021   ANIONGAP 8 12/13/2020   GFR 60.88 04/19/2021   Lab Results  Component Value Date   CHOL 155 04/19/2021   Lab Results  Component Value Date   HDL 52.70 04/19/2021   Lab Results  Component Value Date   LDLCALC 77 04/19/2021   Lab Results  Component Value Date   TRIG 129.0 04/19/2021   Lab Results  Component Value Date   CHOLHDL 3 04/19/2021   Lab Results  Component Value Date   HGBA1C 6.8 (H) 04/19/2021       Colonoscopy- Last completed on 12/14/2020. Hemorrhoids were found and polyps were found in the rectum and cecum and was removed with a cold snare. Diverticulosis was found in the recto-sigmoid colon, sigmoid colon. There were also non-bleeding non-thrombosed external and internal hemorrhoids.  Dexa- Last completed on 08/29/2016. Natalie Swanson is considered osteoporotic according to the [facility]. Repeat in 2 years. Mammogram- Last completed on 09/05/2016. Results showed a mass in the left breast that warrants further evaluation.  Assessment & Plan:   Problem List Items Addressed This Visit     Hypertension    Well controlled, no changes to meds. Encouraged heart healthy diet such as the DASH diet and exercise as tolerated.       Relevant Orders   Comprehensive metabolic panel   TSH   Lipid panel    Hyperlipidemia    Encourage heart healthy diet such as MIND or DASH diet, increase exercise, avoid trans fats, simple carbohydrates and processed foods, consider a krill or fish or flaxseed oil cap daily.       Relevant Orders   Hemoglobin A1c   CBC with Differential/Platelet   Comprehensive metabolic panel   TSH   Lipid panel   Preventative health care    Natalie Swanson encouraged to maintain heart healthy diet, regular exercise, adequate sleep. Consider daily probiotics. Take medications as prescribed. Labs ordered and reviewed. Natalie Swanson has aged out of screening colonoscopies, paps and MGMs. Given flu shot  today      Type 2 diabetes mellitus with hemoglobin A1c goal of less than 7.0% (HCC)    hgba1c acceptable, minimize simple carbs. Increase exercise as tolerated. Continue current meds. Has not seen the opthamologist in several years, agrees to regurn      Relevant Orders   Hemoglobin A1c   CBC with Differential/Platelet   Comprehensive metabolic panel   TSH   Lipid panel   Microalbumin / creatinine urine ratio   Vitamin D deficiency    Supplement and monitor      Relevant Orders   Vitamin D 1,25 dihydroxy   Other Visit Diagnoses     Need for influenza vaccination    -  Primary   Relevant Orders   Flu Vaccine QUAD High Dose(Fluad) (Completed)   Encounter for Medicare annual wellness exam       Relevant Orders   Hemoglobin A1c   CBC with Differential/Platelet   Comprehensive metabolic panel   TSH   Lipid panel   Vitamin D 1,25 dihydroxy   Vitamin B12       No orders of the defined types were placed in this encounter.   I,Zite Okoli,acting as a Education administrator for Penni Homans, MD.,have documented all relevant documentation on the behalf of Penni Homans, MD,as directed by  Penni Homans, MD while in the presence of Penni Homans, MD.   I, Mosie Lukes, MD., personally preformed the services described in this documentation.  All medical record entries made by the scribe were at my  direction and in my presence.  I have reviewed the chart and discharge instructions (if applicable) and agree that the record reflects my personal performance and is accurate and complete. 04/26/2021

## 2021-04-26 NOTE — Assessment & Plan Note (Signed)
Supplement and monitor 

## 2021-04-26 NOTE — Assessment & Plan Note (Signed)
Encourage heart healthy diet such as MIND or DASH diet, increase exercise, avoid trans fats, simple carbohydrates and processed foods, consider a krill or fish or flaxseed oil cap daily.  °

## 2021-04-26 NOTE — Patient Instructions (Addendum)
Consider Tdap to protect against COVID and Tetanus can get at pharmacy or call and return here for shot is you have an injury Consider checking with pharmacy regarding coverage for Shingrix shingles shot. COVID shots at [place] if you want them  Preventive Care 83 Years and Older, Female Preventive care refers to lifestyle choices and visits with your health care provider that can promote health and wellness. This includes: A yearly physical exam. This is also called an annual wellness visit. Regular dental and eye exams. Immunizations. Screening for certain conditions. Healthy lifestyle choices, such as: Eating a healthy diet. Getting regular exercise. Not using drugs or products that contain nicotine and tobacco. Limiting alcohol use. What can I expect for my preventive care visit? Physical exam Your health care provider will check your: Height and weight. These may be used to calculate your BMI (body mass index). BMI is a measurement that tells if you are at a healthy weight. Heart rate and blood pressure. Body temperature. Skin for abnormal spots. Counseling Your health care provider may ask you questions about your: Past medical problems. Family's medical history. Alcohol, tobacco, and drug use. Emotional well-being. Home life and relationship well-being. Sexual activity. Diet, exercise, and sleep habits. History of falls. Memory and ability to understand (cognition). Work and work Statistician. Pregnancy and menstrual history. Access to firearms. What immunizations do I need? Vaccines are usually given at various ages, according to a schedule. Your health care provider will recommend vaccines for you based on your age, medical history, and lifestyle or other factors, such as travel or where you work. What tests do I need? Blood tests Lipid and cholesterol levels. These may be checked every 5 years, or more often depending on your overall health. Hepatitis C  test. Hepatitis B test. Screening Lung cancer screening. You may have this screening every year starting at age 83 if you have a 30-pack-year history of smoking and currently smoke or have quit within the past 15 years. Colorectal cancer screening. All adults should have this screening starting at age 83 and continuing until age 83. Your health care provider may recommend screening at age 99 if you are at increased risk. You will have tests every 1-10 years, depending on your results and the type of screening test. Diabetes screening. This is done by checking your blood sugar (glucose) after you have not eaten for a while (fasting). You may have this done every 1-3 years. Mammogram. This may be done every 1-2 years. Talk with your health care provider about how often you should have regular mammograms. Abdominal aortic aneurysm (AAA) screening. You may need this if you are a current or former smoker. BRCA-related cancer screening. This may be done if you have a family history of breast, ovarian, tubal, or peritoneal cancers. Other tests STD (sexually transmitted disease) testing, if you are at risk. Bone density scan. This is done to screen for osteoporosis. You may have this done starting at age 83. Talk with your health care provider about your test results, treatment options, and if necessary, the need for more tests. Follow these instructions at home: Eating and drinking  Eat a diet that includes fresh fruits and vegetables, whole grains, lean protein, and low-fat dairy products. Limit your intake of foods with high amounts of sugar, saturated fats, and salt. Take vitamin and mineral supplements as recommended by your health care provider. Do not drink alcohol if your health care provider tells you not to drink. If you drink alcohol:  Limit how much you have to 0-1 drink a day. Be aware of how much alcohol is in your drink. In the [place] one drink equals one 12 oz bottle of beer (355  mL), one 5 oz glass of wine (148 mL), or one 1 oz glass of hard liquor (44 mL). Lifestyle Take daily care of your teeth and gums. Brush your teeth every morning and night with fluoride toothpaste. Floss one time each day. Stay active. Exercise for at least 30 minutes 5 or more days each week. Do not use any products that contain nicotine or tobacco, such as cigarettes, e-cigarettes, and chewing tobacco. If you need help quitting, ask your health care provider. Do not use drugs. If you are sexually active, practice safe sex. Use a condom or other form of protection in order to prevent STIs (sexually transmitted infections). Talk with your health care provider about taking a low-dose aspirin or statin. Find healthy ways to cope with stress, such as: Meditation, yoga, or listening to music. Journaling. Talking to a trusted person. Spending time with friends and family. Safety Always wear your seat belt while driving or riding in a vehicle. Do not drive: If you have been drinking alcohol. Do not ride with someone who has been drinking. When you are tired or distracted. While texting. Wear a helmet and other protective equipment during sports activities. If you have firearms in your house, make sure you follow all gun safety procedures. What's next? Visit your health care provider once a year for an annual wellness visit. Ask your health care provider how often you should have your eyes and teeth checked. Stay up to date on all vaccines. This information is not intended to replace advice given to you by your health care provider. Make sure you discuss any questions you have with your health care provider. Document Revised: 08/28/2020 Document Reviewed: 06/14/2018 Elsevier Patient Education  2022 [employer].

## 2021-04-26 NOTE — Assessment & Plan Note (Signed)
Patient encouraged to maintain heart healthy diet, regular exercise, adequate sleep. Consider daily probiotics. Take medications as prescribed. Labs ordered and reviewed. She has aged out of screening colonoscopies, paps and MGMs. Given flu shot today

## 2021-04-26 NOTE — Assessment & Plan Note (Signed)
hgba1c acceptable, minimize simple carbs. Increase exercise as tolerated. Continue current meds. Has not seen the opthamologist in several years, agrees to regurn

## 2021-07-13 ENCOUNTER — Encounter: Payer: Self-pay | Admitting: Medical

## 2021-08-31 ENCOUNTER — Other Ambulatory Visit: Payer: Self-pay | Admitting: Family Medicine

## 2021-08-31 DIAGNOSIS — E119 Type 2 diabetes mellitus without complications: Secondary | ICD-10-CM

## 2021-10-18 ENCOUNTER — Other Ambulatory Visit (INDEPENDENT_AMBULATORY_CARE_PROVIDER_SITE_OTHER): Payer: Medicare HMO

## 2021-10-18 DIAGNOSIS — E785 Hyperlipidemia, unspecified: Secondary | ICD-10-CM

## 2021-10-18 DIAGNOSIS — Z0000 Encounter for general adult medical examination without abnormal findings: Secondary | ICD-10-CM | POA: Diagnosis not present

## 2021-10-18 DIAGNOSIS — E119 Type 2 diabetes mellitus without complications: Secondary | ICD-10-CM

## 2021-10-18 DIAGNOSIS — E559 Vitamin D deficiency, unspecified: Secondary | ICD-10-CM | POA: Diagnosis not present

## 2021-10-18 DIAGNOSIS — I10 Essential (primary) hypertension: Secondary | ICD-10-CM

## 2021-10-18 LAB — LIPID PANEL
Cholesterol: 196 mg/dL (ref 0–200)
HDL: 59.3 mg/dL (ref >39.00)
LDL Cholesterol: 112 mg/dL — ABNORMAL HIGH (ref 0–99)
NonHDL: 136.63
Total CHOL/HDL Ratio: 3
Triglycerides: 123 mg/dL (ref 0.0–149.0)
VLDL: 24.6 mg/dL (ref 0.0–40.0)

## 2021-10-18 LAB — CBC WITH DIFFERENTIAL/PLATELET
Basophils Absolute: 0.1 10*3/uL (ref 0.0–0.1)
Basophils Relative: 0.7 % (ref 0.0–3.0)
Eosinophils Absolute: 0.4 10*3/uL (ref 0.0–0.7)
Eosinophils Relative: 5.3 % — ABNORMAL HIGH (ref 0.0–5.0)
HCT: 28.8 % — ABNORMAL LOW (ref 36.0–46.0)
Hemoglobin: 8.9 g/dL — ABNORMAL LOW (ref 12.0–15.0)
Lymphocytes Relative: 31.4 % (ref 12.0–46.0)
Lymphs Abs: 2.2 10*3/uL (ref 0.7–4.0)
MCHC: 30.9 g/dL (ref 30.0–36.0)
MCV: 69.1 fl — ABNORMAL LOW (ref 78.0–100.0)
Monocytes Absolute: 0.8 10*3/uL (ref 0.1–1.0)
Monocytes Relative: 11 % (ref 3.0–12.0)
Neutro Abs: 3.6 10*3/uL (ref 1.4–7.7)
Neutrophils Relative %: 51.6 % (ref 43.0–77.0)
Platelets: 267 10*3/uL (ref 150.0–400.0)
RBC: 4.16 Mil/uL (ref 3.87–5.11)
RDW: 23.6 % — ABNORMAL HIGH (ref 11.5–15.5)
WBC: 7 10*3/uL (ref 4.0–10.5)

## 2021-10-18 LAB — COMPREHENSIVE METABOLIC PANEL
ALT: 18 U/L (ref 0–35)
AST: 28 U/L (ref 0–37)
Albumin: 4.2 g/dL (ref 3.5–5.2)
Alkaline Phosphatase: 82 U/L (ref 39–117)
BUN: 13 mg/dL (ref 6–23)
CO2: 28 mEq/L (ref 19–32)
Calcium: 9.4 mg/dL (ref 8.4–10.5)
Chloride: 103 mEq/L (ref 96–112)
Creatinine, Ser: 0.88 mg/dL (ref 0.40–1.20)
GFR: 60.66 mL/min (ref >60.00)
Glucose, Bld: 112 mg/dL — ABNORMAL HIGH (ref 70–99)
Potassium: 4.4 mEq/L (ref 3.5–5.1)
Sodium: 138 mEq/L (ref 135–145)
Total Bilirubin: 0.6 mg/dL (ref 0.2–1.2)
Total Protein: 6.9 g/dL (ref 6.0–8.3)

## 2021-10-18 LAB — MICROALBUMIN / CREATININE URINE RATIO
Creatinine,U: 47.5 mg/dL
Microalb Creat Ratio: 1.5 mg/g (ref 0.0–30.0)
Microalb, Ur: <0.7 mg/dL (ref 0.0–1.9)

## 2021-10-18 LAB — TSH: TSH: 2.45 u[IU]/mL (ref 0.35–5.50)

## 2021-10-18 LAB — VITAMIN B12: Vitamin B-12: 269 pg/mL (ref 211–911)

## 2021-10-18 LAB — HEMOGLOBIN A1C: Hgb A1c MFr Bld: 7 % — ABNORMAL HIGH (ref 4.6–6.5)

## 2021-10-19 ENCOUNTER — Other Ambulatory Visit: Payer: Self-pay

## 2021-10-19 DIAGNOSIS — D649 Anemia, unspecified: Secondary | ICD-10-CM

## 2021-10-19 DIAGNOSIS — D62 Acute posthemorrhagic anemia: Secondary | ICD-10-CM

## 2021-10-21 ENCOUNTER — Other Ambulatory Visit (INDEPENDENT_AMBULATORY_CARE_PROVIDER_SITE_OTHER): Payer: Medicare HMO

## 2021-10-21 ENCOUNTER — Telehealth: Payer: Self-pay

## 2021-10-21 DIAGNOSIS — D62 Acute posthemorrhagic anemia: Secondary | ICD-10-CM

## 2021-10-21 DIAGNOSIS — D649 Anemia, unspecified: Secondary | ICD-10-CM

## 2021-10-21 LAB — VITAMIN D 1,25 DIHYDROXY
Vitamin D 1, 25 (OH)2 Total: 44 pg/mL (ref 18–72)
Vitamin D2 1, 25 (OH)2: <8 pg/mL
Vitamin D3 1, 25 (OH)2: 44 pg/mL

## 2021-10-21 NOTE — Addendum Note (Signed)
Addended by: Manuela Schwartz on: 10/21/2021 12:24 PM ? ? Modules accepted: Orders ? ?

## 2021-10-21 NOTE — Telephone Encounter (Signed)
Left message for patient to return call to office in reference to lab work  ?

## 2021-10-21 NOTE — Telephone Encounter (Signed)
Patient returned call and states she will have her cbc done at office visit on 10/25/2021 ?

## 2021-10-22 LAB — IRON,TIBC AND FERRITIN PANEL
%SAT: 7 % (calc) — ABNORMAL LOW (ref 16–45)
Ferritin: 4 ng/mL — ABNORMAL LOW (ref 16–288)
Iron: 35 ug/dL — ABNORMAL LOW (ref 45–160)
TIBC: 518 mcg/dL (calc) — ABNORMAL HIGH (ref 250–450)

## 2021-10-25 ENCOUNTER — Ambulatory Visit (INDEPENDENT_AMBULATORY_CARE_PROVIDER_SITE_OTHER): Payer: Medicare HMO | Admitting: Family Medicine

## 2021-10-25 ENCOUNTER — Ambulatory Visit: Payer: Medicare HMO | Admitting: Family Medicine

## 2021-10-25 ENCOUNTER — Other Ambulatory Visit (INDEPENDENT_AMBULATORY_CARE_PROVIDER_SITE_OTHER): Payer: Medicare HMO

## 2021-10-25 ENCOUNTER — Encounter: Payer: Self-pay | Admitting: Family Medicine

## 2021-10-25 VITALS — BP 129/43 | HR 55 | Ht 64.0 in | Wt 139.6 lb

## 2021-10-25 DIAGNOSIS — D509 Iron deficiency anemia, unspecified: Secondary | ICD-10-CM | POA: Diagnosis not present

## 2021-10-25 DIAGNOSIS — E785 Hyperlipidemia, unspecified: Secondary | ICD-10-CM

## 2021-10-25 DIAGNOSIS — E559 Vitamin D deficiency, unspecified: Secondary | ICD-10-CM

## 2021-10-25 DIAGNOSIS — D649 Anemia, unspecified: Secondary | ICD-10-CM

## 2021-10-25 DIAGNOSIS — E119 Type 2 diabetes mellitus without complications: Secondary | ICD-10-CM | POA: Diagnosis not present

## 2021-10-25 DIAGNOSIS — D62 Acute posthemorrhagic anemia: Secondary | ICD-10-CM | POA: Diagnosis not present

## 2021-10-25 LAB — CBC WITH DIFFERENTIAL/PLATELET
Basophils Absolute: 0.1 10*3/uL (ref 0.0–0.1)
Basophils Relative: 0.9 % (ref 0.0–3.0)
Eosinophils Absolute: 0.3 10*3/uL (ref 0.0–0.7)
Eosinophils Relative: 3.8 % (ref 0.0–5.0)
HCT: 31 % — ABNORMAL LOW (ref 36.0–46.0)
Hemoglobin: 9.5 g/dL — ABNORMAL LOW (ref 12.0–15.0)
Lymphocytes Relative: 39.1 % (ref 12.0–46.0)
Lymphs Abs: 3 10*3/uL (ref 0.7–4.0)
MCHC: 30.8 g/dL (ref 30.0–36.0)
MCV: 70.5 fl — ABNORMAL LOW (ref 78.0–100.0)
Monocytes Absolute: 0.8 10*3/uL (ref 0.1–1.0)
Monocytes Relative: 9.9 % (ref 3.0–12.0)
Neutro Abs: 3.5 10*3/uL (ref 1.4–7.7)
Neutrophils Relative %: 46.3 % (ref 43.0–77.0)
Platelets: 303 10*3/uL (ref 150.0–400.0)
RBC: 4.4 Mil/uL (ref 3.87–5.11)
RDW: 23.4 % — ABNORMAL HIGH (ref 11.5–15.5)
WBC: 7.6 10*3/uL (ref 4.0–10.5)

## 2021-10-25 LAB — HEMOCCULT GUIAC POC 1CARD (OFFICE): Fecal Occult Blood, POC: NEGATIVE

## 2021-10-25 MED ORDER — FERROUS SULFATE 325 (65 FE) MG PO TABS: 325.0000 mg | ORAL_TABLET | Freq: Every day | ORAL | 0 refills | Status: DC

## 2021-10-25 NOTE — Progress Notes (Signed)
? ?Established Patient Office Visit ? ?Subjective   ?Patient ID: Natalie Swanson, female    DOB: 15-Aug-1935  Age: 86 y.o. MRN: [id number] ? ?CC: routine f/u ? ? ?HPI ? ?Iron deficiency anemia: ?- Recent labs 10/21/21 with low iron and Hgb 8.9. She needs a refill on her Ferrous Sulfate. States she ran out several months ago. Denies any blood in stool or urine.  ?- iFOB and repeat CBC ordered by PCP - states she forgot to bring iFOB. She doesn't want to bring it back since she lives so far away, so we will do occult stool in office today. Will get CBC done before leaving today. ? ? ?HYPERLIPIDEMIA - labs last week were stable, LDL = 112 ?- medications: simvastatin 40 mg daily  ?- compliance: good ?- medication SEs: no ?The ASCVD Risk score (Arnett DK, et al., 2019) failed to calculate for the following reasons: ?  The 2019 ASCVD risk score is only valid for ages 58 to 47 ? ? ?Vitamin D deficiency: ?- Labs last week = stable, no changes  ?- Taking 5,000 units vitamin D daily (when she remembers) ? ? ?DIABETES: ?- Checking BG at home: around 110 usually, last A1c (10/18/21) 7% ?- Medications: metformin 500 mg daily ?- Compliance: good ?- Diet: healthy, low-carb ?- Exercise: walking daily, yard work  ?- Denies symptoms of hypoglycemia, polyuria, polydipsia, numbness extremities, foot ulcers/trauma, wounds that are not healing, medication side effects  ? ? ? ? ? ? ?ROS ?All review of systems negative except what is listed in the HPI ? ?  ?Objective:  ?  ? ?BP (!) 129/43   Pulse (!) 55   Ht '5\' 4"'$  (1.626 m)   Wt 139 lb 9.6 oz (63.3 kg)   BMI 23.96 kg/m?  ? ? ?Physical Exam ?Vitals reviewed.  ?Constitutional:   ?   Appearance: Normal appearance. She is normal weight.  ?HENT:  ?   Head: Normocephalic and atraumatic.  ?Cardiovascular:  ?   Rate and Rhythm: Normal rate and regular rhythm.  ?Pulmonary:  ?   Effort: Pulmonary effort is normal.  ?   Breath sounds: Normal breath sounds.  ?Genitourinary: ?   Rectum: Guaiac result  negative.  ?Skin: ?   General: Skin is warm and dry.  ?Neurological:  ?   General: No focal deficit present.  ?   Mental Status: She is alert and oriented to person, place, and time. Mental status is at baseline.  ?Psychiatric:     ?   Mood and Affect: Mood normal.     ?   Behavior: Behavior normal.     ?   Thought Content: Thought content normal.     ?   Judgment: Judgment normal.  ? ? ? ?Results for orders placed or performed in visit on 10/25/21  ?POCT Occult Blood Stool  ?Result Value Ref Range  ? Fecal Occult Blood, POC Negative Negative  ? Card #1 Date    ? Card #2 Fecal Occult Blod, POC    ? Card #2 Date    ? Card #3 Fecal Occult Blood, POC    ? Card #3 Date    ? ? ? ? ?The ASCVD Risk score (Arnett DK, et al., 2019) failed to calculate for the following reasons: ?  The 2019 ASCVD risk score is only valid for ages 26 to 24 ? ?  ?Assessment & Plan:  ? ?1. Iron deficiency anemia, unspecified iron deficiency anemia type ?Occult stool negative today.  ?  Repeat CBC - previously ordered by PCP.  ?Restart iron supplements - sent in refill.  ?Monitor for any signs of bleeding.  ?- ferrous sulfate 325 (65 FE) MG tablet; Take 1 tablet (325 mg total) by mouth daily with breakfast.  Dispense: 30 tablet; Refill: 0 ?- POCT Occult Blood Stool ? ?2. Hyperlipidemia, unspecified hyperlipidemia type ?Stable. No changes. Continue simvastatin 40 mg, healthy diet, exercise.  ? ?3. Type 2 diabetes mellitus with hemoglobin A1c goal of less than 7.0% (HCC) ?Stable. No changes. Continue metformin 500 mg daily, diabetic diet, exercise.  ? ?4. Vitamin D deficiency ?Stable. No changes. Continue supplementation at 5,000 units daily. ? ? ? ?Return in about 6 months (around 04/26/2022) for routine PCP f/u, CPE.  ? ? ?Terrilyn Saver, NP ? ?

## 2021-10-25 NOTE — Progress Notes (Signed)
No charge. 

## 2021-10-27 ENCOUNTER — Other Ambulatory Visit: Payer: Self-pay

## 2021-10-27 DIAGNOSIS — D649 Anemia, unspecified: Secondary | ICD-10-CM

## 2021-11-21 ENCOUNTER — Other Ambulatory Visit: Payer: Self-pay | Admitting: Family Medicine

## 2021-11-21 DIAGNOSIS — D509 Iron deficiency anemia, unspecified: Secondary | ICD-10-CM

## 2021-11-26 ENCOUNTER — Other Ambulatory Visit (INDEPENDENT_AMBULATORY_CARE_PROVIDER_SITE_OTHER): Payer: Medicare HMO

## 2021-11-26 DIAGNOSIS — D649 Anemia, unspecified: Secondary | ICD-10-CM

## 2021-11-26 LAB — CBC WITH DIFFERENTIAL/PLATELET
Basophils Absolute: 0 10*3/uL (ref 0.0–0.1)
Basophils Relative: 0.4 % (ref 0.0–3.0)
Eosinophils Absolute: 0.3 10*3/uL (ref 0.0–0.7)
Eosinophils Relative: 3.9 % (ref 0.0–5.0)
HCT: 37.2 % (ref 36.0–46.0)
Hemoglobin: 11.7 g/dL — ABNORMAL LOW (ref 12.0–15.0)
Lymphocytes Relative: 29.3 % (ref 12.0–46.0)
Lymphs Abs: 2.2 10*3/uL (ref 0.7–4.0)
MCHC: 31.5 g/dL (ref 30.0–36.0)
MCV: 79.3 fl (ref 78.0–100.0)
Monocytes Absolute: 0.7 10*3/uL (ref 0.1–1.0)
Monocytes Relative: 9 % (ref 3.0–12.0)
Neutro Abs: 4.4 10*3/uL (ref 1.4–7.7)
Neutrophils Relative %: 57.4 % (ref 43.0–77.0)
Platelets: 247 10*3/uL (ref 150.0–400.0)
RBC: 4.69 Mil/uL (ref 3.87–5.11)
RDW: 27.1 % — ABNORMAL HIGH (ref 11.5–15.5)
WBC: 7.6 10*3/uL (ref 4.0–10.5)

## 2021-11-27 LAB — IRON,TIBC AND FERRITIN PANEL
%SAT: 25 % (calc) (ref 16–45)
Ferritin: 34 ng/mL (ref 16–288)
Iron: 112 ug/dL (ref 45–160)
TIBC: 452 mcg/dL (calc) — ABNORMAL HIGH (ref 250–450)

## 2022-01-21 ENCOUNTER — Telehealth: Payer: Self-pay | Admitting: Family Medicine

## 2022-01-21 NOTE — Telephone Encounter (Signed)
Left message for patient to call back and schedule Medicare Annual Wellness Visit (AWV).   Please offer to do virtually or by telephone.  Left office number and my jabber [phone].  Last AWV:01/28/2021  Please schedule at anytime with Nurse Health Advisor.

## 2022-01-28 ENCOUNTER — Encounter: Payer: Self-pay | Admitting: Internal Medicine

## 2022-02-15 ENCOUNTER — Ambulatory Visit (INDEPENDENT_AMBULATORY_CARE_PROVIDER_SITE_OTHER): Payer: Medicare HMO

## 2022-02-15 DIAGNOSIS — Z0000 Encounter for general adult medical examination without abnormal findings: Secondary | ICD-10-CM | POA: Diagnosis not present

## 2022-02-15 NOTE — Patient Instructions (Signed)
Natalie Swanson , Thank you for taking time to come for your Medicare Wellness Visit. I appreciate your ongoing commitment to your health goals. Please review the following plan we discussed and let me know if I can assist you in the future.   Screening recommendations/referrals: Colonoscopy: no longer required  Mammogram: no longer required  Recommended yearly ophthalmology/optometry visit for glaucoma screening and checkup Recommended yearly dental visit for hygiene and checkup  Vaccinations: Influenza vaccine: done 04/26/21 repeat every year  Pneumococcal vaccine: Up to date Tdap vaccine: due and discussed  Shingles vaccine: Shingrix discussed. Please contact your pharmacy for coverage information.    Covid-19:declined   Advanced directives: copies in chart   Conditions/risks identified: none at this time   Next appointment: Follow up in one year for your annual wellness visit    Preventive Care 65 Years and Older, Female Preventive care refers to lifestyle choices and visits with your health care provider that can promote health and wellness. What does preventive care include? A yearly physical exam. This is also called an annual well check. Dental exams once or twice a year. Routine eye exams. Ask your health care provider how often you should have your eyes checked. Personal lifestyle choices, including: Daily care of your teeth and gums. Regular physical activity. Eating a healthy diet. Avoiding tobacco and drug use. Limiting alcohol use. Practicing safe sex. Taking low-dose aspirin every day. Taking vitamin and mineral supplements as recommended by your health care provider. What happens during an annual well check? The services and screenings done by your health care provider during your annual well check will depend on your age, overall health, lifestyle risk factors, and family history of disease. Counseling  Your health care provider may ask you questions about  your: Alcohol use. Tobacco use. Drug use. Emotional well-being. Home and relationship well-being. Sexual activity. Eating habits. History of falls. Memory and ability to understand (cognition). Work and work Statistician. Reproductive health. Screening  You may have the following tests or measurements: Height, weight, and BMI. Blood pressure. Lipid and cholesterol levels. These may be checked every 5 years, or more frequently if you are over 50 years old. Skin check. Lung cancer screening. You may have this screening every year starting at age 33 if you have a 30-pack-year history of smoking and currently smoke or have quit within the past 15 years. Fecal occult blood test (FOBT) of the stool. You may have this test every year starting at age 100. Flexible sigmoidoscopy or colonoscopy. You may have a sigmoidoscopy every 5 years or a colonoscopy every 10 years starting at age 58. Hepatitis C blood test. Hepatitis B blood test. Sexually transmitted disease (STD) testing. Diabetes screening. This is done by checking your blood sugar (glucose) after you have not eaten for a while (fasting). You may have this done every 1-3 years. Bone density scan. This is done to screen for osteoporosis. You may have this done starting at age 45. Mammogram. This may be done every 1-2 years. Talk to your health care provider about how often you should have regular mammograms. Talk with your health care provider about your test results, treatment options, and if necessary, the need for more tests. Vaccines  Your health care provider may recommend certain vaccines, such as: Influenza vaccine. This is recommended every year. Tetanus, diphtheria, and acellular pertussis (Tdap, Td) vaccine. You may need a Td booster every 10 years. Zoster vaccine. You may need this after age 17. Pneumococcal 13-valent conjugate (PCV13) vaccine.  One dose is recommended after age 40. Pneumococcal polysaccharide (PPSV23) vaccine.  One dose is recommended after age 1. Talk to your health care provider about which screenings and vaccines you need and how often you need them. This information is not intended to replace advice given to you by your health care provider. Make sure you discuss any questions you have with your health care provider. Document Released: 07/17/2015 Document Revised: 03/09/2016 Document Reviewed: 04/21/2015 Elsevier Interactive Patient Education  2017 [place] Prevention in the Home Falls can cause injuries. They can happen to people of all ages. There are many things you can do to make your home safe and to help prevent falls. What can I do on the outside of my home? Regularly fix the edges of walkways and driveways and fix any cracks. Remove anything that might make you trip as you walk through a door, such as a raised step or threshold. Trim any bushes or trees on the path to your home. Use bright outdoor lighting. Clear any walking paths of anything that might make someone trip, such as rocks or tools. Regularly check to see if handrails are loose or broken. Make sure that both sides of any steps have handrails. Any raised decks and porches should have guardrails on the edges. Have any leaves, snow, or ice cleared regularly. Use sand or salt on walking paths during winter. Clean up any spills in your garage right away. This includes oil or grease spills. What can I do in the bathroom? Use night lights. Install grab bars by the toilet and in the tub and shower. Do not use towel bars as grab bars. Use non-skid mats or decals in the tub or shower. If you need to sit down in the shower, use a plastic, non-slip stool. Keep the floor dry. Clean up any water that spills on the floor as soon as it happens. Remove soap buildup in the tub or shower regularly. Attach bath mats securely with double-sided non-slip rug tape. Do not have throw rugs and other things on the floor that can make  you trip. What can I do in the bedroom? Use night lights. Make sure that you have a light by your bed that is easy to reach. Do not use any sheets or blankets that are too big for your bed. They should not hang down onto the floor. Have a firm chair that has side arms. You can use this for support while you get dressed. Do not have throw rugs and other things on the floor that can make you trip. What can I do in the kitchen? Clean up any spills right away. Avoid walking on wet floors. Keep items that you use a lot in easy-to-reach places. If you need to reach something above you, use a strong step stool that has a grab bar. Keep electrical cords out of the way. Do not use floor polish or wax that makes floors slippery. If you must use wax, use non-skid floor wax. Do not have throw rugs and other things on the floor that can make you trip. What can I do with my stairs? Do not leave any items on the stairs. Make sure that there are handrails on both sides of the stairs and use them. Fix handrails that are broken or loose. Make sure that handrails are as long as the stairways. Check any carpeting to make sure that it is firmly attached to the stairs. Fix any carpet that is loose or  worn. Avoid having throw rugs at the top or bottom of the stairs. If you do have throw rugs, attach them to the floor with carpet tape. Make sure that you have a light switch at the top of the stairs and the bottom of the stairs. If you do not have them, ask someone to add them for you. What else can I do to help prevent falls? Wear shoes that: Do not have high heels. Have rubber bottoms. Are comfortable and fit you well. Are closed at the toe. Do not wear sandals. If you use a stepladder: Make sure that it is fully opened. Do not climb a closed stepladder. Make sure that both sides of the stepladder are locked into place. Ask someone to hold it for you, if possible. Clearly mark and make sure that you can  see: Any grab bars or handrails. First and last steps. Where the edge of each step is. Use tools that help you move around (mobility aids) if they are needed. These include: Canes. Walkers. Scooters. Crutches. Turn on the lights when you go into a dark area. Replace any light bulbs as soon as they burn out. Set up your furniture so you have a clear path. Avoid moving your furniture around. If any of your floors are uneven, fix them. If there are any pets around you, be aware of where they are. Review your medicines with your doctor. Some medicines can make you feel dizzy. This can increase your chance of falling. Ask your doctor what other things that you can do to help prevent falls. This information is not intended to replace advice given to you by your health care provider. Make sure you discuss any questions you have with your health care provider. Document Released: 04/16/2009 Document Revised: 11/26/2015 Document Reviewed: 07/25/2014 Elsevier Interactive Patient Education  2017 [employer].

## 2022-02-15 NOTE — Progress Notes (Signed)
Virtual Visit via Telephone Note  I connected with  Natalie Swanson on 02/15/22 at  1:45 PM EDT by telephone and verified that I am speaking with the correct person using two identifiers.  Medicare Annual Wellness visit completed telephonically due to Covid-19 pandemic.   Persons participating in this call: This Health Coach and this patient.   Location: Patient: home Provider: office    I discussed the limitations, risks, security and privacy concerns of performing an evaluation and management service by telephone and the availability of in person appointments. The patient expressed understanding and agreed to proceed.  Unable to perform video visit due to video visit attempted and failed and/or patient does not have video capability.   Some vital signs may be absent or patient reported.   Willette Brace, LPN   Subjective:   Natalie Swanson is a 86 y.o. female who presents for Medicare Annual (Subsequent) preventive examination.  Review of Systems     Cardiac Risk Factors include: advanced age (>56mn, >>84women);diabetes mellitus;dyslipidemia;hypertension     Objective:    There were no vitals filed for this visit. There is no height or weight on file to calculate BMI.     02/15/2022    1:52 PM 01/28/2021   10:24 AM 12/14/2020   11:51 AM 12/13/2020    4:00 PM 01/21/2020    8:50 AM 01/17/2019    9:12 AM 01/16/2018    8:19 AM  Advanced Directives  Does Patient Have a Medical Advance Directive? Yes Yes No No Yes Yes Yes  Type of AIndustrial/product designerof ABuchanan Lake VillageLiving will Healthcare Power of AA[place]Living will HCedar HillsLiving will [place]Living will [place]aur[place]burgLiving will  Does patient want to make changes to medical advance directive?     No - Patient declined No - Patient declined   Copy of [place]in Chart? Yes -  validated most recent copy scanned in chart (See row information) Yes - validated most recent copy scanned in chart (See row information)  No - copy requested Yes - validated most recent copy scanned in chart (See row information) Yes - validated most recent copy scanned in chart (See row information) Yes  Would patient like information on creating a medical advance directive?   No - Patient declined        Current Medications (verified) Outpatient Encounter Medications as of 02/15/2022  Medication Sig   Alcohol Swabs (B-D SINGLE USE SWABS REGULAR) PADS USE AS DIRECTED ONCE A DAY AND AS NEEDED   Blood Glucose Calibration (TRUE METRIX LEVEL 3) High SOLN Use as directed.   Blood Glucose Monitoring Suppl (TRUE METRIX METER) w/Device KIT USE AS DIRECTED ONE TIME DAILY  AND AS NEEDED   Cholecalciferol (VITAMIN D3) 5000 UNITS CAPS Take 5,000 Units by mouth 2 (two) times daily. MORNING & AFTERNOON   ferrous sulfate 325 (65 FE) MG tablet TAKE 1 TABLET BY MOUTH EVERY DAY WITH BREAKFAST   metFORMIN (GLUCOPHAGE) 500 MG tablet TAKE 1 TABLET DAILY WITH BREAKFAST   Omega-3 Fatty Acids (FISH OIL) 1200 MG CAPS Take 1,200 mg by mouth daily.   propranolol (INDERAL) 40 MG tablet TAKE 1 TABLET TWICE DAILY   simvastatin (ZOCOR) 40 MG tablet TAKE 1 TABLET AT BEDTIME   TRUE METRIX BLOOD GLUCOSE TEST test strip USE AS DIRECTED ONE TIME DAILY  AND AS NEEDED   TRUEplus Lancets 33G MISC USE  TO CHECK SUGAR ONCE A DAY AND AS NEEDED.   calamine lotion Apply 1 application topically 4 (four) times daily as needed (for itching/irritated skin.). (Patient not taking: Reported on 02/15/2022)   pantoprazole (PROTONIX) 40 MG tablet Take 1 tablet (40 mg total) by mouth daily at 6 (six) AM.   No facility-administered encounter medications on file as of 02/15/2022.    Allergies (verified) Sulfa antibiotics   History: Past Medical History:  Diagnosis Date   Abnormal liver function tests 04/03/2015   Atopic dermatitis 01/11/2012    Cataract 07/04/2006   b/l   Chicken pox as a child   DM type 2, goal A1c below 7 07/18/2012   Flashers or floaters, right eye 03/2011   floaters   History of shingles    History of tobacco abuse 01/11/2012   Hx of adenomatous colonic polyps 11/07/2014   Hyperglycemia 07/18/2012   Hyperlipidemia    Hypertension    Measles as a child   Medicare annual wellness visit, subsequent 04/17/2011   G4P1 s/p 1 svd, no abnormal papas, menarche at 44 menopause at 66, regular    Mumps as a child   Pain in joint, ankle and foot 04/27/2014   right   Shingles 03/2011   Vitamin D deficiency 09/14/2014   Past Surgical History:  Procedure Laterality Date   BIOPSY  12/13/2020   Procedure: BIOPSY;  Surgeon: Ladene Artist, MD;  [facility];  Service: Endoscopy;;   BREAST LUMPECTOMY WITH RADIOACTIVE SEED LOCALIZATION Left 11/16/2016   Procedure: LEFT BREAST LUMPECTOMY WITH RADIOACTIVE SEED LOCALIZATION;  Surgeon: Jovita Kussmaul, MD;  Location: [facility];  Service: General;  Laterality: Left;   CATARACT EXTRACTION W/ INTRAOCULAR LENS  IMPLANT, BILATERAL Bilateral 07-04-2006   CESAREAN SECTION  1981   X 1   COLONOSCOPY WITH PROPOFOL N/A 12/14/2020   Procedure: COLONOSCOPY WITH PROPOFOL;  Surgeon: Irving Copas., MD;  Location[facility] ENDOSCOPY;  Service: Gastroenterology;  Laterality: N/A;   ESOPHAGOGASTRODUODENOSCOPY (EGD) WITH PROPOFOL N/A 12/13/2020   Procedure: ESOPHAGOGASTRODUODENOSCOPY (EGD) WITH PROPOFOL;  Surgeon: Ladene Artist, MD;  Location: [facility] ENDOSCOPY;  Service: Endoscopy;  Laterality: N/A;   POLYPECTOMY  12/14/2020   Procedure: POLYPECTOMY;  Surgeon: Mansouraty, Telford Nab., MD;  [facility]COPY;  Service: Gastroenterology;;   Family History  Problem Relation Age of Onset   Hyperlipidemia Mother    Hypertension Mother    Heart disease Mother        MI   Heart attack Father    Diabetes Father        type 2   Cancer Father        type unknown   Heart attack  Brother    Heart disease Brother 49       MI   Hemophilia Son        x 2   Hypertension Son    Colon cancer Neg Hx    Stomach cancer Neg Hx    Social History   Socioeconomic History   Marital status: Widowed    Spouse name: Not on file   Number of children: 4   Years of education: Not on file   Highest education level: Not on file  Occupational History   Occupation: retired  Tobacco Use   Smoking status: Former    Packs/day: 0.20    Years: 61.00    Total pack years: 12.20    Types: Cigarettes   Smokeless tobacco: Never   Tobacco comments:    uses  e cigs ; quit smoking cigarettes 2013.  Substance and Sexual Activity   Alcohol use: No    Alcohol/week: 0.0 standard drinks of alcohol   Drug use: No   Sexual activity: Yes  Other Topics Concern   Not on file  Social History Narrative   Not on file   Social Determinants of Health   Financial Resource Strain: Low Risk  (02/15/2022)   Overall Financial Resource Strain (CARDIA)    Difficulty of Paying Living Expenses: Not hard at all  Food Insecurity: No Food Insecurity (02/15/2022)   Hunger Vital Sign    Worried About Running Out of Food in the Last Year: Never true    Ran Out of Food in the Last Year: Never true  Transportation Needs: No Transportation Needs (02/15/2022)   PRAPARE - Transportation    Lack of Transportation (Medical): No    Lack of Transportation (Non-Medical): No  Physical Activity: Inactive (02/15/2022)   Exercise Vital Sign    Days of Exercise per Week: 0 days    Minutes of Exercise per Session: 0 min  Stress: No Stress Concern Present (02/15/2022)   [employer] - Occupational Stress Questionnaire    Feeling of Stress : Not at all  Social Connections: Socially Isolated (02/15/2022)   Social Connection and Isolation Panel [NHANES]    Frequency of Communication with Friends and Family: More than three times a week    Frequency of Social Gatherings with Friends and Family:  More than three times a week    Attends Religious Services: Never    Active Member of Clubs or Organizations: No    Attends Club or Organization Meetings: Never    Marital Status: Widowed    Tobacco Counseling Counseling given: Not Answered Tobacco comments: uses e cigs ; quit smoking cigarettes 2013.   Clinical Intake:  Pre-visit preparation completed: Yes  Pain : No/denies pain     Nutritional Risks: None Diabetes: Yes CBG done?: No Did pt. bring in CBG monitor from home?: No  How often do you need to have someone help you when you read instructions, pamphlets, or other written materials from your doctor or pharmacy?: 1 - Never  Diabetic?Nutrition Risk Assessment:  Has the patient had any N/V/D within the last 2 months?  No  Does the patient have any non-healing wounds?  No  Has the patient had any unintentional weight loss or weight gain?  No   Diabetes:  Is the patient diabetic?  Yes  If diabetic, was a CBG obtained today?  No  Did the patient bring in their glucometer from home?  No  How often do you monitor your CBG's? daily.   Financial Strains and Diabetes Management:  Are you having any financial strains with the device, your supplies or your medication? No .  Does the patient want to be seen by Chronic Care Management for management of their diabetes?  No  Would the patient like to be referred to a Nutritionist or for Diabetic Management?  No   Diabetic Exams:  Diabetic Eye Exam: Overdue for diabetic eye exam. Pt has been advised about the importance in completing this exam. Patient advised to call and schedule an eye exam. Diabetic Foot Exam: Completed 04/26/21   Interpreter Needed?: No  Information entered by ::  , LPN   Activities of Daily Living    02/15/2022    1:53 PM  In your present state of health, do you have any difficulty performing the following   activities:  Hearing? 0  Vision? 0  Difficulty concentrating or making  decisions? 0  Walking or climbing stairs? 0  Dressing or bathing? 0  Doing errands, shopping? 0  Preparing Food and eating ? N  Using the Toilet? N  In the past six months, have you accidently leaked urine? N  Do you have problems with loss of bowel control? N  Managing your Medications? N  Managing your Finances? N  Housekeeping or managing your Housekeeping? N    Patient Care Team: Blyth, Stacey A, MD as PCP - General (Family Medicine)  Indicate any recent Medical Services you may have received from other than [facility] providers in the past year (date may be approximate).     Assessment:   This is a routine wellness examination for [place].  Hearing/Vision screen Hearing Screening - Comments:: Pt denies any hearing issues  Vision Screening - Comments:: Pt encouraged to follow up with provider   Dietary issues and exercise activities discussed: Current Exercise Habits: The patient does not participate in regular exercise at present   Goals Addressed             This Visit's Progress    Patient Stated       None at this time        Depression Screen    02/15/2022    1:51 PM 01/28/2021   10:26 AM 01/21/2020    9:00 AM 01/17/2019    9:12 AM 01/16/2018    8:20 AM 07/28/2016   10:13 AM 06/25/2015   10:20 AM  PHQ 2/9 Scores  PHQ - 2 Score 0 0 0 0 0 0 0    Fall Risk    02/15/2022    1:53 PM 01/28/2021   10:26 AM 01/21/2020    9:00 AM 01/17/2019    9:12 AM 01/16/2018    8:20 AM  Fall Risk   Falls in the past year? 0 0 0 0 No  Number falls in past yr: 0 0 0    Injury with Fall? 0 0 0    Risk for fall due to : Impaired vision      Follow up Falls prevention discussed Falls prevention discussed Education provided;Falls prevention discussed      FALL RISK PREVENTION PERTAINING TO THE HOME:  Any stairs in or around the home? No  If so, are there any without handrails? No  Home free of loose throw rugs in walkways, pet beds, electrical cords, etc? Yes  Adequate lighting  in your home to reduce risk of falls? Yes   ASSISTIVE DEVICES UTILIZED TO PREVENT FALLS:  Life alert? No  Use of a cane, walker or w/c? Yes  Grab bars in the bathroom? No  Shower chair or bench in shower? Yes  Elevated toilet seat or a handicapped toilet? Yes   TIMED UP AND GO:  Was the test performed? No .  Cognitive Function:    07/28/2016   10:14 AM 06/25/2015   10:36 AM  MMSE - Mini Mental State Exam  Orientation to time 5 5  Orientation to Place 5 5  Registration 3 3  Attention/ Calculation 5 5  Recall 3 3  Language- name 2 objects 2 2  Language- repeat 1 1  Language- follow 3 step command 3 3  Language- read & follow direction 1 1  Write a sentence 1 1  Copy design 1 1  Total score 30 30        02/15/2022      1:53 PM  6CIT Screen  What Year? 0 points  What month? 0 points  What time? 0 points  Count back from 20 0 points  Months in reverse 4 points  Repeat phrase 0 points  Total Score 4 points    Immunizations Immunization History  Administered Date(s) Administered   Fluad Quad(high Dose 65+) 04/26/2021   Influenza Split 04/06/2011, 07/18/2012   Influenza, High Dose Seasonal PF 06/08/2017   Influenza,inj,Quad PF,6+ Mos 03/13/2013, 04/18/2014, 03/27/2015   Pneumococcal Conjugate-13 07/26/2013   Pneumococcal Polysaccharide-23 06/25/2015   Tdap 04/06/2011    TDAP status: Due, Education has been provided regarding the importance of this vaccine. Advised may receive this vaccine at local pharmacy or Health Dept. Aware to provide a copy of the vaccination record if obtained from local pharmacy or Health Dept. Verbalized acceptance and understanding.  Flu Vaccine status: Up to date  Pneumococcal vaccine status: Up to date  Covid-19 vaccine status: Declined, Education has been provided regarding the importance of this vaccine but patient still declined. Advised may receive this vaccine at local pharmacy or Health Dept.or vaccine clinic. Aware to provide a  copy of the vaccination record if obtained from local pharmacy or Health Dept. Verbalized acceptance and understanding.  Qualifies for Shingles Vaccine? Yes   Zostavax completed No   Shingrix Completed?: No.    Education has been provided regarding the importance of this vaccine. Patient has been advised to call insurance company to determine out of pocket expense if they have not yet received this vaccine. Advised may also receive vaccine at local pharmacy or Health Dept. Verbalized acceptance and understanding.  Screening Tests Health Maintenance  Topic Date Due   OPHTHALMOLOGY EXAM  02/08/2017   TETANUS/TDAP  04/05/2021   INFLUENZA VACCINE  02/01/2022   COVID-19 Vaccine (1) 03/03/2022 (Originally 08/15/1936)   Zoster Vaccines- Shingrix (1 of 2) 05/18/2022 (Originally 02/13/1986)   HEMOGLOBIN A1C  04/19/2022   FOOT EXAM  04/26/2022   URINE MICROALBUMIN  10/19/2022   Pneumonia Vaccine 65+ Years old  Completed   DEXA SCAN  Completed   HPV VACCINES  Aged Out    Health Maintenance  Health Maintenance Due  Topic Date Due   OPHTHALMOLOGY EXAM  02/08/2017   TETANUS/TDAP  04/05/2021   INFLUENZA VACCINE  02/01/2022    Colorectal cancer screening: No longer required.   Mammogram status: No longer required due to age.  Bone Density status: Completed 08/29/16. Results reflect: Bone density results: OSTEOPOROSIS. Repeat every 2 years.   Additional Screening:   Vision Screening: Recommended annual ophthalmology exams for early detection of glaucoma and other disorders of the eye. Is the patient up to date with their annual eye exam?  No  Who is the provider or what is the name of the office in which the patient attends annual eye exams? Encouraged to follow up with provider  If pt is not established with a provider, would they like to be referred to a provider to establish care? No   Dental Screening: Recommended annual dental exams for proper oral hygiene  Community Resource  Referral / Chronic Care Management: CRR required this visit?  No   CCM required this visit?  No      Plan:     I have personally reviewed and noted the following in the patient's chart:   Medical and social history Use of alcohol, tobacco or illicit drugs  Current medications and supplements including opioid prescriptions.  Functional ability and status Nutritional status Physical activity   Advanced directives List of other physicians Hospitalizations, surgeries, and ER visits in previous 12 months Vitals Screenings to include cognitive, depression, and falls Referrals and appointments  In addition, I have reviewed and discussed with patient certain preventive protocols, quality metrics, and best practice recommendations. A written personalized care plan for preventive services as well as general preventive health recommendations were provided to patient.      H , LPN   02/15/2022   Nurse Notes: none       

## 2022-04-26 ENCOUNTER — Ambulatory Visit: Payer: Medicare HMO | Admitting: Family Medicine

## 2022-05-03 ENCOUNTER — Ambulatory Visit (INDEPENDENT_AMBULATORY_CARE_PROVIDER_SITE_OTHER): Payer: Medicare HMO | Admitting: Family Medicine

## 2022-05-03 VITALS — BP 122/40 | HR 56 | Temp 98.0°F | Resp 16 | Ht 60.0 in | Wt 138.4 lb

## 2022-05-03 DIAGNOSIS — E119 Type 2 diabetes mellitus without complications: Secondary | ICD-10-CM | POA: Diagnosis not present

## 2022-05-03 DIAGNOSIS — I10 Essential (primary) hypertension: Secondary | ICD-10-CM | POA: Diagnosis not present

## 2022-05-03 DIAGNOSIS — Z23 Encounter for immunization: Secondary | ICD-10-CM | POA: Diagnosis not present

## 2022-05-03 DIAGNOSIS — D649 Anemia, unspecified: Secondary | ICD-10-CM | POA: Diagnosis not present

## 2022-05-03 DIAGNOSIS — E785 Hyperlipidemia, unspecified: Secondary | ICD-10-CM | POA: Diagnosis not present

## 2022-05-03 DIAGNOSIS — D62 Acute posthemorrhagic anemia: Secondary | ICD-10-CM

## 2022-05-03 DIAGNOSIS — E559 Vitamin D deficiency, unspecified: Secondary | ICD-10-CM

## 2022-05-03 NOTE — Assessment & Plan Note (Addendum)
Encourage heart healthy diet such as MIND or DASH diet, increase exercise, avoid trans fats, simple carbohydrates and processed foods, consider a krill or fish or flaxseed oil cap daily. Tolerating Simvastatin 

## 2022-05-03 NOTE — Assessment & Plan Note (Signed)
Increase leafy greens, consider increased lean red meat and using cast iron cookware. Continue to monitor, report any concerns 

## 2022-05-03 NOTE — Assessment & Plan Note (Deleted)
Increase leafy greens, consider increased lean red meat and using cast iron cookware. Continue to monitor, report any concerns 

## 2022-05-03 NOTE — Assessment & Plan Note (Addendum)
Well controlled, no changes to meds. Encouraged heart healthy diet such as the DASH diet and exercise as tolerated. Flu shot given today

## 2022-05-03 NOTE — Progress Notes (Signed)
Subjective:   By signing my name below, I, Kellie Simmering, attest that this documentation has been prepared under the direction and in the presence of Mosie Lukes, MD., 05/03/2022.     Patient ID: Natalie Swanson, female    DOB: 18-Feb-1936, 86 y.o.   MRN: [id number]  No chief complaint on file.  HPI Patient is in today for an office visit.  Immunizations: She will receive a high-dose Flu immunization today. She has been informed about receiving COVID-19 and RSV immunizations.   Supplements: She currently takes Ferrous Sulfate 325 mg daily.  Past Medical History:  Diagnosis Date   Abnormal liver function tests 04/03/2015   Atopic dermatitis 01/11/2012   Cataract 07/04/2006   b/l   Chicken pox as a child   DM type 2, goal A1c below 7 07/18/2012   Flashers or floaters, right eye 03/2011   floaters   History of shingles    History of tobacco abuse 01/11/2012   Hx of adenomatous colonic polyps 11/07/2014   Hyperglycemia 07/18/2012   Hyperlipidemia    Hypertension    Measles as a child   Medicare annual wellness visit, subsequent 04/17/2011   G4P1 s/p 1 svd, no abnormal papas, menarche at 56 menopause at 49, regular    Mumps as a child   Pain in joint, ankle and foot 04/27/2014   right   Shingles 03/2011   Vitamin D deficiency 09/14/2014   Past Surgical History:  Procedure Laterality Date   BIOPSY  12/13/2020   Procedure: BIOPSY;  Surgeon: Ladene Artist, MD;  Location: [facility];  Service: Endoscopy;;   BREAST LUMPECTOMY WITH RADIOACTIVE SEED LOCALIZATION Left 11/16/2016   Procedure: LEFT BREAST LUMPECTOMY WITH RADIOACTIVE SEED LOCALIZATION;  Surgeon: Jovita Kussmaul, MD;  Location: [place];  Service: General;  Laterality: Left;   CATARACT EXTRACTION W/ INTRAOCULAR LENS  IMPLANT, BILATERAL Bilateral 07-04-2006   CESAREAN SECTION  1981   X 1   COLONOSCOPY WITH PROPOFOL N/A 12/14/2020   Procedure: COLONOSCOPY WITH PROPOFOL;  Surgeon: Irving Copas., MD;   Location: [facility] ENDOSCOPY;  Service: Gastroenterology;  Laterality: N/A;   ESOPHAGOGASTRODUODENOSCOPY (EGD) WITH PROPOFOL N/A 12/13/2020   Procedure: ESOPHAGOGASTRODUODENOSCOPY (EGD) WITH PROPOFOL;  Surgeon: Ladene Artist, MD;  Location: [facility] ENDOSCOPY;  Service: Endoscopy;  Laterality: N/A;   POLYPECTOMY  12/14/2020   Procedure: POLYPECTOMY;  Surgeon: Mansouraty, Telford Nab., MD;  Location: [facility] ENDOSCOPY;  Service: Gastroenterology;;   Family History  Problem Relation Age of Onset   Hyperlipidemia Mother    Hypertension Mother    Heart disease Mother        MI   Heart attack Father    Diabetes Father        type 2   Cancer Father        type unknown   Heart attack Brother    Heart disease Brother 23       MI   Hemophilia Son        x 2   Hypertension Son    Colon cancer Neg Hx    Stomach cancer Neg Hx    Social History   Socioeconomic History   Marital status: Widowed    Spouse name: Not on file   Number of children: 4   Years of education: Not on file   Highest education level: Not on file  Occupational History   Occupation: retired  Tobacco Use   Smoking status: Former    Packs/day: 0.20  Years: 61.00    Total pack years: 12.20    Types: Cigarettes   Smokeless tobacco: Never   Tobacco comments:    uses e cigs ; quit smoking cigarettes 2013.  Substance and Sexual Activity   Alcohol use: No    Alcohol/week: 0.0 standard drinks of alcohol   Drug use: No   Sexual activity: Yes  Other Topics Concern   Not on file  Social History Narrative   Not on file   Social Determinants of Health   Financial Resource Strain: Low Risk  (02/15/2022)   Overall Financial Resource Strain (CARDIA)    Difficulty of Paying Living Expenses: Not hard at all  Food Insecurity: No Food Insecurity (02/15/2022)   Hunger Vital Sign    Worried About Running Out of Food in the Last Year: Never true    Ran Out of Food in the Last Year: Never true  Transportation Needs: No Transportation  Needs (02/15/2022)   PRAPARE - Hydrologist (Medical): No    Lack of Transportation (Non-Medical): No  Physical Activity: Inactive (02/15/2022)   Exercise Vital Sign    Days of Exercise per Week: 0 days    Minutes of Exercise per Session: 0 min  Stress: No Stress Concern Present (02/15/2022)   [place]    Feeling of Stress : Not at all  Social Connections: Socially Isolated (02/15/2022)   Social Connection and Isolation Panel [NHANES]    Frequency of Communication with Friends and Family: More than three times a week    Frequency of Social Gatherings with Friends and Family: More than three times a week    Attends Religious Services: Never    Marine scientist or Organizations: No    Attends Archivist Meetings: Never    Marital Status: Widowed  Intimate Partner Violence: Not At Risk (02/15/2022)   Humiliation, Afraid, Rape, and Kick questionnaire    Fear of Current or Ex-Partner: No    Emotionally Abused: No    Physically Abused: No    Sexually Abused: No   Outpatient Medications Prior to Visit  Medication Sig Dispense Refill   Alcohol Swabs (B-D SINGLE USE SWABS REGULAR) PADS USE AS DIRECTED ONCE A DAY AND AS NEEDED 200 each 1   Blood Glucose Calibration (TRUE METRIX LEVEL 3) High SOLN Use as directed. 1 each 0   Blood Glucose Monitoring Suppl (TRUE METRIX METER) w/Device KIT USE AS DIRECTED ONE TIME DAILY  AND AS NEEDED 1 kit 0   calamine lotion Apply 1 application topically 4 (four) times daily as needed (for itching/irritated skin.). (Patient not taking: Reported on 02/15/2022)     Cholecalciferol (VITAMIN D3) 5000 UNITS CAPS Take 5,000 Units by mouth 2 (two) times daily. MORNING & AFTERNOON     ferrous sulfate 325 (65 FE) MG tablet TAKE 1 TABLET BY MOUTH EVERY DAY WITH BREAKFAST 90 tablet 1   metFORMIN (GLUCOPHAGE) 500 MG tablet TAKE 1 TABLET DAILY WITH BREAKFAST 90 tablet  3   Omega-3 Fatty Acids (FISH OIL) 1200 MG CAPS Take 1,200 mg by mouth daily.     pantoprazole (PROTONIX) 40 MG tablet Take 1 tablet (40 mg total) by mouth daily at 6 (six) AM. 30 tablet 0   propranolol (INDERAL) 40 MG tablet TAKE 1 TABLET TWICE DAILY 180 tablet 2   simvastatin (ZOCOR) 40 MG tablet TAKE 1 TABLET AT BEDTIME 90 tablet 2   TRUE METRIX BLOOD  GLUCOSE TEST test strip USE AS DIRECTED ONE TIME DAILY  AND AS NEEDED 200 strip 1   TRUEplus Lancets 33G MISC USE TO CHECK SUGAR ONCE A DAY AND AS NEEDED. 200 each 1   No facility-administered medications prior to visit.   Allergies  Allergen Reactions   Sulfa Antibiotics Rash   Review of Systems  Gastrointestinal: Negative.   Genitourinary: Negative.       Objective:    Physical Exam Constitutional:      General: She is not in acute distress.    Appearance: Normal appearance. She is not ill-appearing.  HENT:     Head: Normocephalic and atraumatic.     Right Ear: External ear normal.     Left Ear: External ear normal.     Mouth/Throat:     Mouth: Mucous membranes are moist.     Pharynx: Oropharynx is clear.  Eyes:     Extraocular Movements: Extraocular movements intact.     Pupils: Pupils are equal, round, and reactive to light.  Cardiovascular:     Rate and Rhythm: Normal rate and regular rhythm.     Pulses: Normal pulses.     Heart sounds: Normal heart sounds. No murmur heard.    No gallop.  Pulmonary:     Effort: Pulmonary effort is normal. No respiratory distress.     Breath sounds: Normal breath sounds. No wheezing or rales.  Abdominal:     General: Bowel sounds are normal.  Skin:    General: Skin is warm and dry.  Neurological:     Mental Status: She is alert and oriented to person, place, and time.  Psychiatric:        Mood and Affect: Mood normal.        Behavior: Behavior normal.        Judgment: Judgment normal.    There were no vitals taken for this visit. Wt Readings from Last 3 Encounters:   10/25/21 139 lb 9.6 oz (63.3 kg)  04/26/21 142 lb 12.8 oz (64.8 kg)  01/28/21 146 lb 6.4 oz (66.4 kg)   Diabetic Foot Exam - Simple   No data filed    Lab Results  Component Value Date   WBC 7.6 11/26/2021   HGB 11.7 (L) 11/26/2021   HCT 37.2 11/26/2021   PLT 247.0 11/26/2021   GLUCOSE 112 (H) 10/18/2021   CHOL 196 10/18/2021   TRIG 123.0 10/18/2021   HDL 59.30 10/18/2021   LDLDIRECT 68.0 06/05/2017   LDLCALC 112 (H) 10/18/2021   ALT 18 10/18/2021   AST 28 10/18/2021   NA 138 10/18/2021   K 4.4 10/18/2021   CL 103 10/18/2021   CREATININE 0.88 10/18/2021   BUN 13 10/18/2021   CO2 28 10/18/2021   TSH 2.45 10/18/2021   INR 1.0 12/12/2020   HGBA1C 7.0 (H) 10/18/2021   MICROALBUR <0.7 10/18/2021   Lab Results  Component Value Date   TSH 2.45 10/18/2021   Lab Results  Component Value Date   WBC 7.6 11/26/2021   HGB 11.7 (L) 11/26/2021   HCT 37.2 11/26/2021   MCV 79.3 11/26/2021   PLT 247.0 11/26/2021   Lab Results  Component Value Date   NA 138 10/18/2021   K 4.4 10/18/2021   CO2 28 10/18/2021   GLUCOSE 112 (H) 10/18/2021   BUN 13 10/18/2021   CREATININE 0.88 10/18/2021   BILITOT 0.6 10/18/2021   ALKPHOS 82 10/18/2021   AST 28 10/18/2021   ALT 18 10/18/2021  PROT 6.9 10/18/2021   ALBUMIN 4.2 10/18/2021   CALCIUM 9.4 10/18/2021   ANIONGAP 8 12/13/2020   GFR 60.66 10/18/2021   Lab Results  Component Value Date   CHOL 196 10/18/2021   Lab Results  Component Value Date   HDL 59.30 10/18/2021   Lab Results  Component Value Date   LDLCALC 112 (H) 10/18/2021   Lab Results  Component Value Date   TRIG 123.0 10/18/2021   Lab Results  Component Value Date   CHOLHDL 3 10/18/2021   Lab Results  Component Value Date   HGBA1C 7.0 (H) 10/18/2021      Assessment & Plan:   Problem List Items Addressed This Visit   None  No orders of the defined types were placed in this encounter.  I, Kellie Simmering, personally preformed the services  described in this documentation.  All medical record entries made by the scribe were at my direction and in my presence.  I have reviewed the chart and discharge instructions (if applicable) and agree that the record reflects my personal performance and is accurate and complete. 05/03/2022  I,Mohammed Iqbal,acting as a scribe for Penni Homans, MD.,have documented all relevant documentation on the behalf of Penni Homans, MD,as directed by  Penni Homans, MD while in the presence of Penni Homans, MD.  Kellie Simmering

## 2022-05-03 NOTE — Assessment & Plan Note (Addendum)
hgba1c acceptable, minimize simple carbs. Increase exercise as tolerated. Continue current meds RSV (respiratory syncitial virus) vaccine at pharmacy, [place] immunization at pharmacy High dose flu shot Tetanus shot is due Shingrix is the new shingles shot, 2 shots over 2-6 months, confirm coverage with insurance and document, then can return here for shots with nurse appt or at pharmacy

## 2022-05-03 NOTE — Assessment & Plan Note (Signed)
Supplement and monitor 

## 2022-05-03 NOTE — Patient Instructions (Addendum)
Tetanus at pharmacy Arexvy, RSV, Respiratory Syncitial Virus at pharmacy, take alone   Anemia  Anemia is a condition in which there are not enough red blood cells or hemoglobin in the blood. Hemoglobin is a substance in red blood cells that carries oxygen. When you do not have enough red blood cells or hemoglobin (are anemic), your body cannot get enough oxygen, and your organs may not work properly. As a result, you may feel very tired or have other problems. What are the causes? Common causes of anemia include: Excessive bleeding. Anemia can be caused by excessive bleeding inside or outside the body, including bleeding from the intestines or from heavy menstrual periods in females. Poor nutrition. Long-lasting (chronic) kidney, thyroid, and liver disease. Bone marrow disorders, spleen problems, and blood disorders. Cancer and treatments for cancer. Human immunodeficiency virus (HIV) and acquired immunodeficiency syndrome (AIDS). Infections, medicines, and autoimmune disorders that destroy red blood cells. What are the signs or symptoms? Symptoms of this condition include: Minor weakness. Dizziness. Headache, or difficulties concentrating and sleeping. Heartbeats that feel irregular or faster than normal (palpitations). Shortness of breath, especially with exercise. Pale skin, lips, and nails, or cold hands and feet. Upset stomach (indigestion) and nausea. Symptoms may occur suddenly or develop slowly. If your anemia is mild, you may not have symptoms. How is this diagnosed? This condition is diagnosed based on blood tests, your medical history, and a physical exam. In some cases, a test may be needed in which cells are removed from the soft tissue inside of a bone and looked at under a microscope (bone marrow biopsy). Your health care provider may also check your stool (feces) for blood and may do more testing to look for the cause of your bleeding. Other tests may include: Imaging  tests, such as a CT scan or MRI. A procedure to see inside your esophagus and stomach (endoscopy). The esophagus is the part of the body that moves food from your mouth to your stomach. A procedure to see inside your colon and rectum (colonoscopy). How is this treated? Treatment for this condition depends on the cause. If you continue to lose a lot of blood, you may need to be treated at a hospital. Treatment may include: Taking supplements of iron, vitamin G26, or folic acid. Taking a hormone medicine (erythropoietin) that can help to stimulate red blood cell growth. Receiving donated blood through an IV (blood transfusion). This may be needed if you lose a lot of blood. Making changes to your diet. Having surgery to remove your spleen. Follow these instructions at home: Take over-the-counter and prescription medicines only as told by your health care provider. Take supplements only as told by your health care provider. Follow any diet instructions that you were given by your health care provider. Keep all follow-up visits. Your health care provider will want to recheck your blood tests. Contact a health care provider if: You develop new bleeding anywhere in the body. You are very weak. Get help right away if: You are short of breath. You have pain in your abdomen or chest. You are dizzy or feel faint. You have trouble concentrating. You have bloody stools, black stools, or tarry stools. You vomit repeatedly or you vomit up blood. These symptoms may be an emergency. Get help right away. Call 911. Do not wait to see if the symptoms will go away. Do not drive yourself to the hospital. Summary Anemia is a condition in which you do not have enough red blood  cells or enough of a substance in your red blood cells that carries oxygen. Symptoms may occur suddenly or develop slowly. If your anemia is mild, you may not have symptoms. This condition is diagnosed with blood tests, a medical  history, and a physical exam. Other tests may be needed. Treatment for this condition depends on the cause of the anemia. This information is not intended to replace advice given to you by your health care provider. Make sure you discuss any questions you have with your health care provider. Document Revised: 09/13/2021 Document Reviewed: 09/13/2021 Elsevier Patient Education  [place].

## 2022-05-04 LAB — COMPREHENSIVE METABOLIC PANEL
ALT: 17 U/L (ref 0–35)
AST: 29 U/L (ref 0–37)
Albumin: 4.6 g/dL (ref 3.5–5.2)
Alkaline Phosphatase: 79 U/L (ref 39–117)
BUN: 12 mg/dL (ref 6–23)
CO2: 30 mEq/L (ref 19–32)
Calcium: 10.2 mg/dL (ref 8.4–10.5)
Chloride: 101 mEq/L (ref 96–112)
Creatinine, Ser: 0.76 mg/dL (ref 0.40–1.20)
GFR: 72.06 mL/min (ref >60.00)
Glucose, Bld: 92 mg/dL (ref 70–99)
Potassium: 4.5 mEq/L (ref 3.5–5.1)
Sodium: 139 mEq/L (ref 135–145)
Total Bilirubin: 0.6 mg/dL (ref 0.2–1.2)
Total Protein: 7.4 g/dL (ref 6.0–8.3)

## 2022-05-04 LAB — CBC
HCT: 42.8 % (ref 36.0–46.0)
Hemoglobin: 14 g/dL (ref 12.0–15.0)
MCHC: 32.7 g/dL (ref 30.0–36.0)
MCV: 93.1 fl (ref 78.0–100.0)
Platelets: 238 10*3/uL (ref 150.0–400.0)
RBC: 4.59 Mil/uL (ref 3.87–5.11)
RDW: 14.7 % (ref 11.5–15.5)
WBC: 7.2 10*3/uL (ref 4.0–10.5)

## 2022-05-04 LAB — HEMOGLOBIN A1C: Hgb A1c MFr Bld: 6.5 % (ref 4.6–6.5)

## 2022-05-04 LAB — VITAMIN D 25 HYDROXY (VIT D DEFICIENCY, FRACTURES): VITD: 38.24 ng/mL (ref 30.00–100.00)

## 2022-05-04 LAB — LIPID PANEL
Cholesterol: 245 mg/dL — ABNORMAL HIGH (ref 0–200)
HDL: 63.9 mg/dL (ref >39.00)
LDL Cholesterol: 149 mg/dL — ABNORMAL HIGH (ref 0–99)
NonHDL: 181.55
Total CHOL/HDL Ratio: 4
Triglycerides: 161 mg/dL — ABNORMAL HIGH (ref 0.0–149.0)
VLDL: 32.2 mg/dL (ref 0.0–40.0)

## 2022-05-04 LAB — TSH: TSH: 2.1 u[IU]/mL (ref 0.35–5.50)

## 2022-05-04 LAB — IBC + FERRITIN
Ferritin: 30.9 ng/mL (ref 10.0–291.0)
Iron: 109 ug/dL (ref 42–145)
Saturation Ratios: 25.3 % (ref 20.0–50.0)
TIBC: 431.2 ug/dL (ref 250.0–450.0)
Transferrin: 308 mg/dL (ref 212.0–360.0)

## 2022-07-19 ENCOUNTER — Other Ambulatory Visit: Payer: Self-pay | Admitting: Family Medicine

## 2022-08-09 ENCOUNTER — Other Ambulatory Visit: Payer: Self-pay | Admitting: *Deleted

## 2022-08-09 DIAGNOSIS — E785 Hyperlipidemia, unspecified: Secondary | ICD-10-CM

## 2022-08-09 DIAGNOSIS — I10 Essential (primary) hypertension: Secondary | ICD-10-CM

## 2022-08-11 ENCOUNTER — Other Ambulatory Visit (INDEPENDENT_AMBULATORY_CARE_PROVIDER_SITE_OTHER): Payer: Medicare HMO

## 2022-08-11 DIAGNOSIS — E785 Hyperlipidemia, unspecified: Secondary | ICD-10-CM | POA: Diagnosis not present

## 2022-08-11 DIAGNOSIS — I10 Essential (primary) hypertension: Secondary | ICD-10-CM | POA: Diagnosis not present

## 2022-08-11 LAB — COMPREHENSIVE METABOLIC PANEL
ALT: 8 U/L (ref 0–35)
AST: 15 U/L (ref 0–37)
Albumin: 4.5 g/dL (ref 3.5–5.2)
Alkaline Phosphatase: 69 U/L (ref 39–117)
BUN: 14 mg/dL (ref 6–23)
CO2: 29 mEq/L (ref 19–32)
Calcium: 9.9 mg/dL (ref 8.4–10.5)
Chloride: 103 mEq/L (ref 96–112)
Creatinine, Ser: 0.79 mg/dL (ref 0.40–1.20)
GFR: 68.65 mL/min (ref >60.00)
Glucose, Bld: 92 mg/dL (ref 70–99)
Potassium: 4.9 mEq/L (ref 3.5–5.1)
Sodium: 141 mEq/L (ref 135–145)
Total Bilirubin: 0.7 mg/dL (ref 0.2–1.2)
Total Protein: 7 g/dL (ref 6.0–8.3)

## 2022-08-11 LAB — LIPID PANEL
Cholesterol: 145 mg/dL (ref 0–200)
HDL: 60.2 mg/dL (ref >39.00)
LDL Cholesterol: 60 mg/dL (ref 0–99)
NonHDL: 85.25
Total CHOL/HDL Ratio: 2
Triglycerides: 126 mg/dL (ref 0.0–149.0)
VLDL: 25.2 mg/dL (ref 0.0–40.0)

## 2022-08-26 IMAGING — CT CT HEAD CODE STROKE
4 series · 15 of 47 positions shown, 17 images · non-contrast
Comparison: 8334

CLINICAL DATA: Code stroke.  Slurred speech, left-sided weakness

EXAM:
CT HEAD WITHOUT CONTRAST
TECHNIQUE: Contiguous axial images were obtained from the base of the skull
through the vertex without intravenous contrast.

[Series 3: head wo · axial · 0.40mm/px · z∈[-78,+37]mm · 7 of 31 slices shown, 9 images]
[im 4/31  brain]
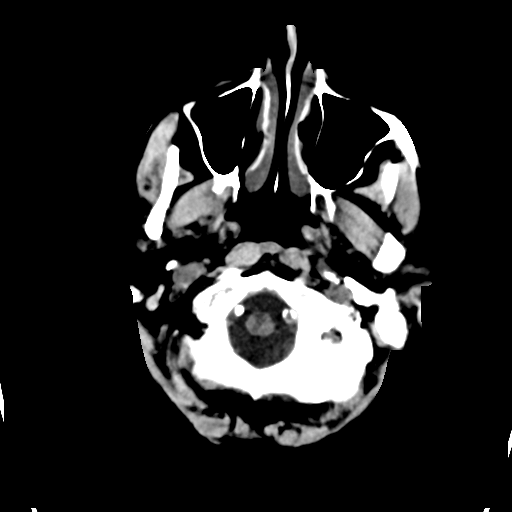
[im 4/31  bone]
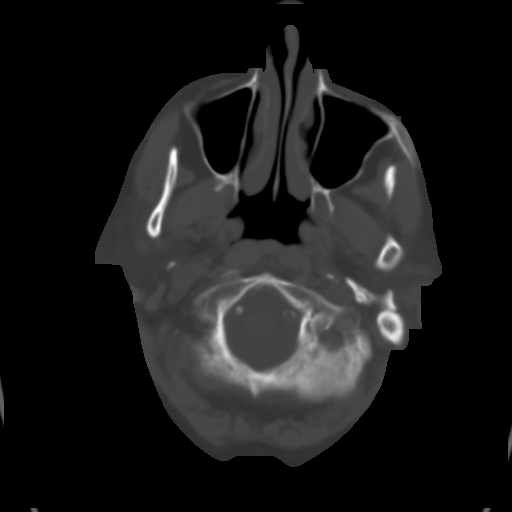
[im 8/31  brain]
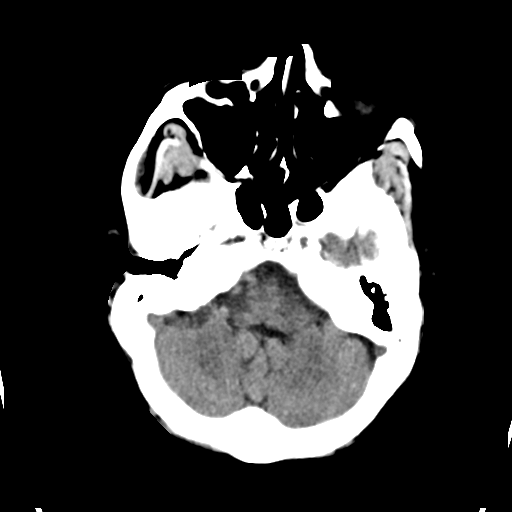
[im 12/31  brain]
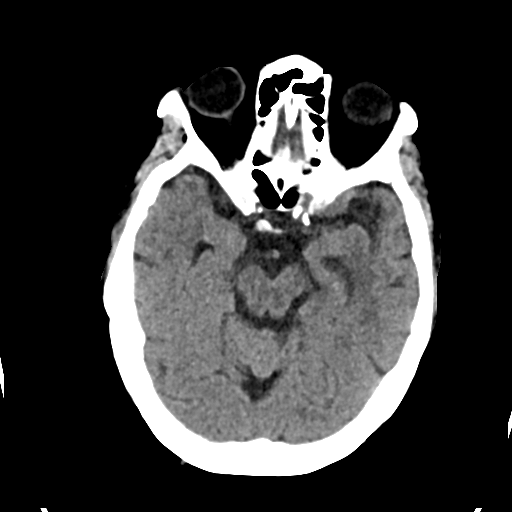
[im 16/31  brain]
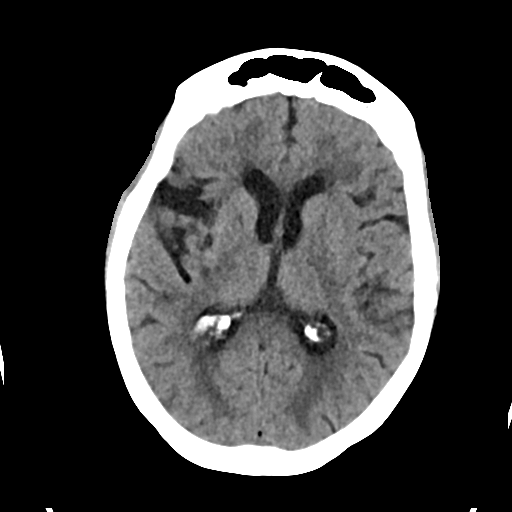
[im 19/31  brain]
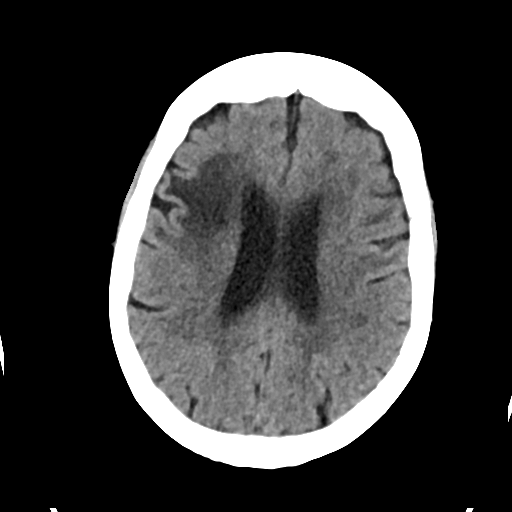
[im 19/31  bone]
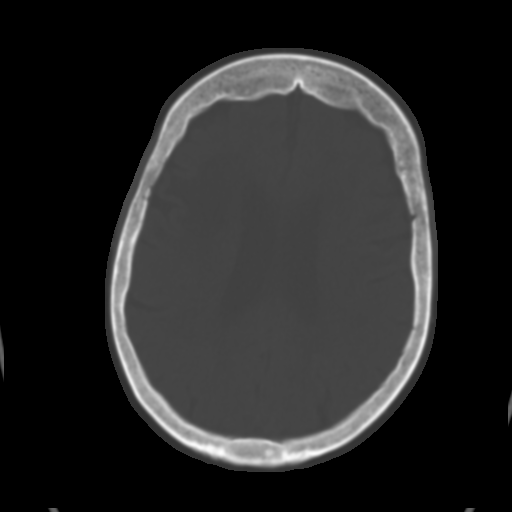
[im 23/31  brain]
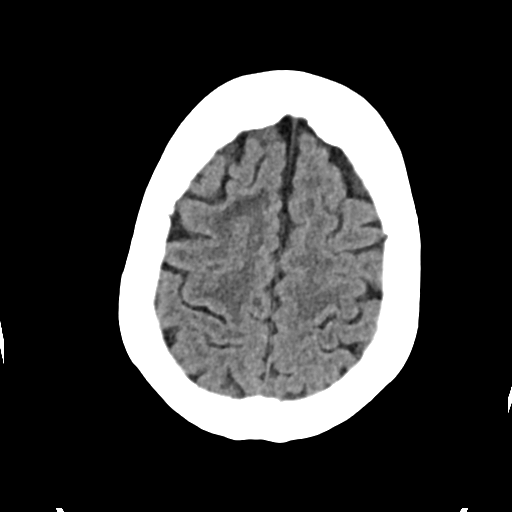
[im 27/31  brain]
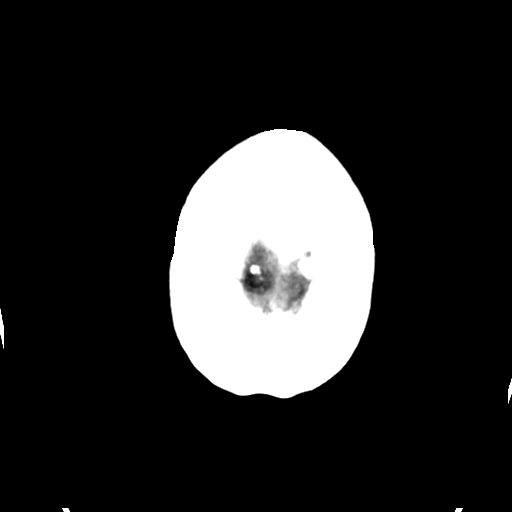

[Series 4: head bone · axial · 0.40mm/px · z∈[-79,-63]mm · 2 of 77 slices shown]
[im 8/77  bone]
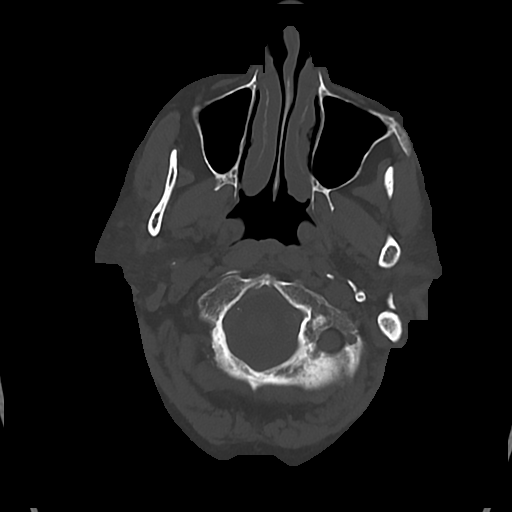
[im 16/77  bone]
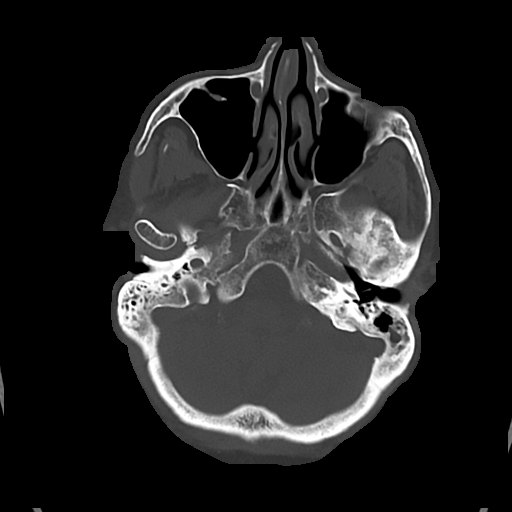

[Series 5: cor soft · coronal · 0.29mm/px · 3 of 66 slices shown]
[im 22/66  brain]
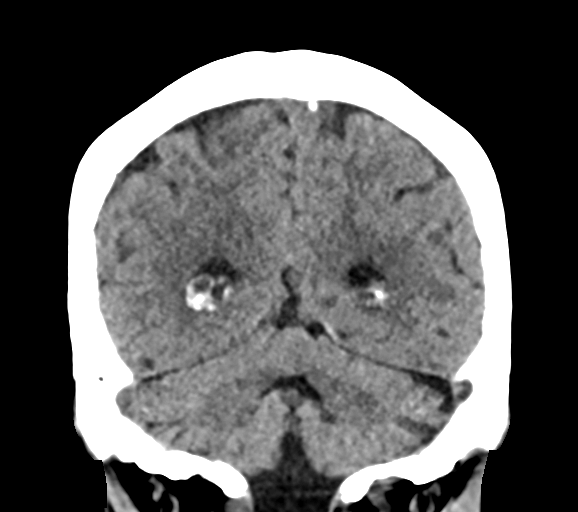
[im 29/66  brain]
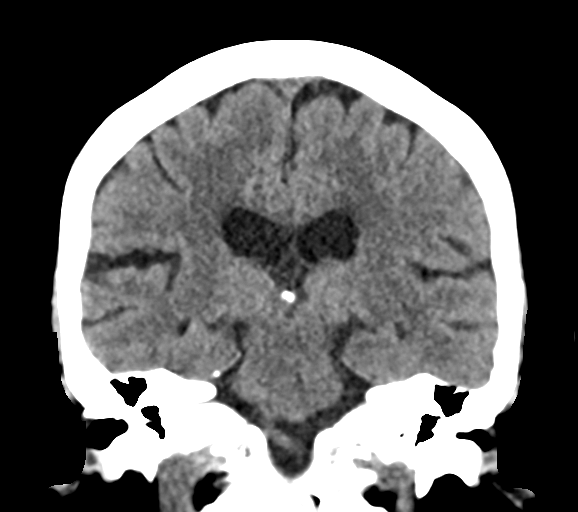
[im 37/66  brain]
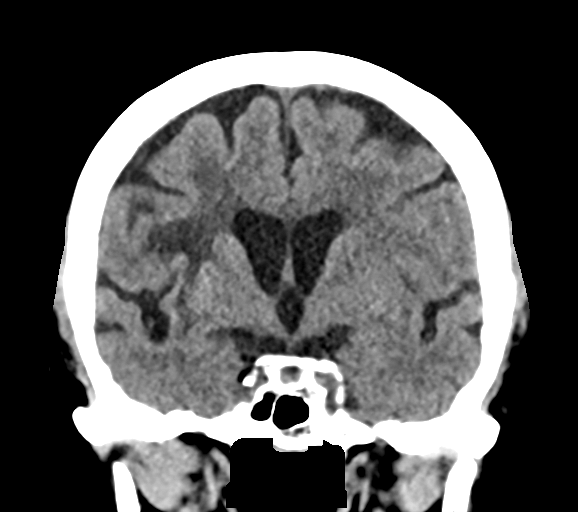

[Series 6: sag soft · sagittal · 0.29mm/px · 3 of 55 slices shown]
[im 19/55  brain]
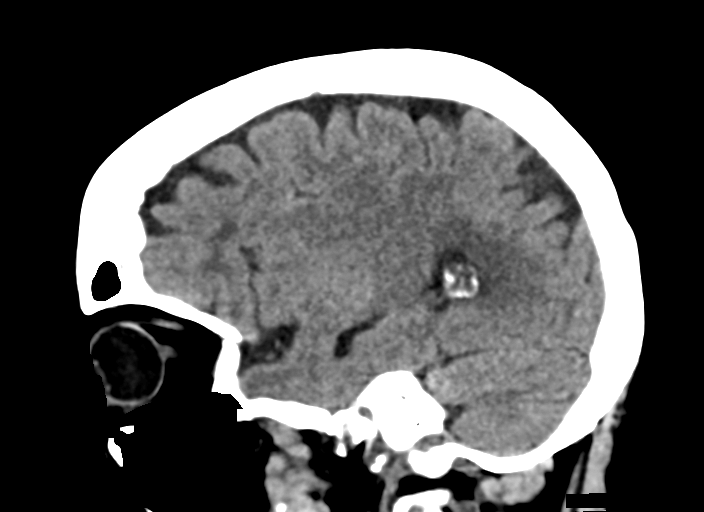
[im 28/55  brain]
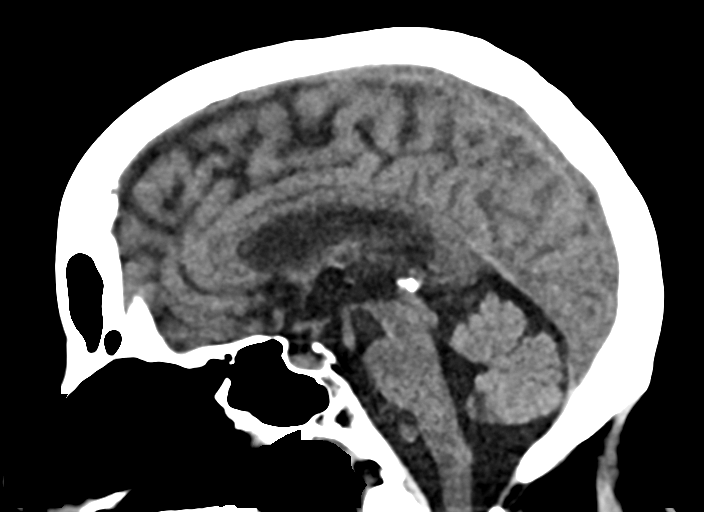
[im 37/55  brain]
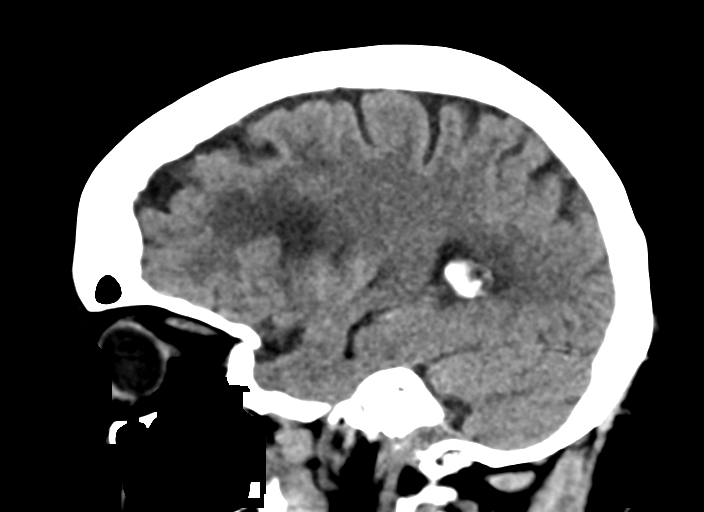

[15 of 47 positions shown; findings below may reference images not displayed]

FINDINGS: Brain: There is no acute intracranial hemorrhage, mass effect, or
edema. Chronic appearing right frontal infarction also involving the
insula. There are small chronic infarcts of the right putamen.
Additional patchy and confluent areas of hypoattenuation in the
supratentorial white matter are nonspecific but probably reflect
moderate chronic microvascular ischemic changes. Prominence of the
ventricles and sulci reflects minor generalized parenchymal volume
loss. There is no extra-axial collection.

Vascular: No hyperdense vessel. There is intracranial
atherosclerotic calcification at the skull base.

Skull: Unremarkable.

Sinuses/Orbits: No acute abnormality.

Other: Nonspecific bilateral mastoid tip opacification.

ASPECTS ([place] Stroke Program Early CT Score)

- Ganglionic level infarction (caudate, lentiform nuclei, internal
capsule, insula, M1-M3 cortex): 7

- Supraganglionic infarction (M4-M6 cortex): 3

Total score (0-10 with 10 being normal): 10
IMPRESSION: There is no acute intracranial hemorrhage or evidence of acute
infarction. ASPECT score is 10.

Chronic infarct of right frontal lobe and insula. Small chronic
infarcts of right putamen.

Moderate chronic microvascular ischemic changes.

Preliminary results were communicated to [REDACTED] at [DATE] on
12/12/2020 by text page via the [employer] messaging system.

## 2022-10-03 ENCOUNTER — Other Ambulatory Visit: Payer: Self-pay | Admitting: Family Medicine

## 2022-10-03 DIAGNOSIS — E119 Type 2 diabetes mellitus without complications: Secondary | ICD-10-CM

## 2022-11-01 ENCOUNTER — Ambulatory Visit (INDEPENDENT_AMBULATORY_CARE_PROVIDER_SITE_OTHER): Payer: Medicare HMO | Admitting: Family Medicine

## 2022-11-01 VITALS — BP 120/60 | HR 53 | Temp 98.0°F | Resp 16 | Ht 61.0 in | Wt 142.0 lb

## 2022-11-01 DIAGNOSIS — Z7984 Long term (current) use of oral hypoglycemic drugs: Secondary | ICD-10-CM

## 2022-11-01 DIAGNOSIS — Z862 Personal history of diseases of the blood and blood-forming organs and certain disorders involving the immune mechanism: Secondary | ICD-10-CM

## 2022-11-01 DIAGNOSIS — E785 Hyperlipidemia, unspecified: Secondary | ICD-10-CM

## 2022-11-01 DIAGNOSIS — E559 Vitamin D deficiency, unspecified: Secondary | ICD-10-CM | POA: Diagnosis not present

## 2022-11-01 DIAGNOSIS — E119 Type 2 diabetes mellitus without complications: Secondary | ICD-10-CM

## 2022-11-01 DIAGNOSIS — I10 Essential (primary) hypertension: Secondary | ICD-10-CM | POA: Diagnosis not present

## 2022-11-01 MED ORDER — VITAMIN D3 50 MCG (2000 UT) PO CAPS: 2000.0000 [IU] | ORAL_CAPSULE | Freq: Two times a day (BID) | ORAL | Status: AC

## 2022-11-01 NOTE — Assessment & Plan Note (Signed)
hgba1c acceptable, minimize simple carbs. Increase exercise as tolerated. Continue current meds 

## 2022-11-01 NOTE — Assessment & Plan Note (Signed)
Increase leafy greens, consider increased lean red meat and using cast iron cookware. Continue to monitor, report any concerns 

## 2022-11-01 NOTE — Assessment & Plan Note (Addendum)
Well controlled, no changes to meds. Encouraged heart healthy diet such as the DASH diet and exercise as tolerated.  °

## 2022-11-01 NOTE — Progress Notes (Signed)
Subjective:   By signing my name below, I, Vickey Sages, attest that this documentation has been prepared under the direction and in the presence of Bradd Canary, MD., 11/01/2022.   Patient ID: Natalie Swanson, female    DOB: [birth year], 87 y.o.   MRN: [id number]  No chief complaint on file.  HPI Patient is in today for an office visit.   Immunizations Patient is doing well today and denies CP/palpitations/SOB/HA/fever/chills/GI or GU symptoms. She has been encouraged to receive annual COVID-19 and Influenza vaccinations as well as RSV, Shingles, and Tetanus vaccinations.  Vitamin D Deficiency Patient continues taking vitamin D 2000 IU twice daily.  Past Medical History:  Diagnosis Date   Abnormal liver function tests 04/03/2015   Atopic dermatitis 01/11/2012   Cataract 07/04/2006   b/l   Chicken pox as a child   DM type 2, goal A1c below 7 07/18/2012   Flashers or floaters, right eye 03/2011   floaters   History of shingles    History of tobacco abuse 01/11/2012   Hx of adenomatous colonic polyps 11/07/2014   Hyperglycemia 07/18/2012   Hyperlipidemia    Hypertension    Measles as a child   Medicare annual wellness visit, subsequent 04/17/2011   G4P1 s/p 1 svd, no abnormal papas, menarche at 14 menopause at 53, regular    Mumps as a child   Pain in joint, ankle and foot 04/27/2014   right   Shingles 03/2011   Vitamin D deficiency 09/14/2014    Past Surgical History:  Procedure Laterality Date   BIOPSY  12/13/2020   Procedure: BIOPSY;  Surgeon: Meryl Dare, MD;  Location: [facility] ENDOSCOPY;  Service: Endoscopy;;   BREAST LUMPECTOMY WITH RADIOACTIVE SEED LOCALIZATION Left 11/16/2016   Procedure: LEFT BREAST LUMPECTOMY WITH RADIOACTIVE SEED LOCALIZATION;  Surgeon: Griselda Miner, MD;  Location: [facility] OR;  Service: General;  Laterality: Left;   CATARACT EXTRACTION W/ INTRAOCULAR LENS  IMPLANT, BILATERAL Bilateral 07-04-2006   CESAREAN SECTION  1981   X 1    COLONOSCOPY WITH PROPOFOL N/A 12/14/2020   Procedure: COLONOSCOPY WITH PROPOFOL;  Surgeon: Lemar Lofty., MD;  Location: [facility] ENDOSCOPY;  Service: Gastroenterology;  Laterality: N/A;   ESOPHAGOGASTRODUODENOSCOPY (EGD) WITH PROPOFOL N/A 12/13/2020   Procedure: ESOPHAGOGASTRODUODENOSCOPY (EGD) WITH PROPOFOL;  Surgeon: Meryl Dare, MD;  Location: [facility] ENDOSCOPY;  Service: Endoscopy;  Laterality: N/A;   POLYPECTOMY  12/14/2020   Procedure: POLYPECTOMY;  Surgeon: Mansouraty, Netty Starring., MD;  Location: [facility] ENDOSCOPY;  Service: Gastroenterology;;    Family History  Problem Relation Age of Onset   Hyperlipidemia Mother    Hypertension Mother    Heart disease Mother        MI   Heart attack Father    Diabetes Father        type 2   Cancer Father        type unknown   Heart attack Brother    Heart disease Brother 77       MI   Hemophilia Son        x 2   Hypertension Son    Colon cancer Neg Hx    Stomach cancer Neg Hx     Social History   Socioeconomic History   Marital status: Widowed    Spouse name: Not on file   Number of children: 4   Years of education: Not on file   Highest education level: Not on file  Occupational History   Occupation: retired  Tobacco Use   Smoking status: Former    Packs/day: 0.20    Years: 61.00    Additional pack years: 0.00    Total pack years: 12.20    Types: Cigarettes   Smokeless tobacco: Never   Tobacco comments:    uses e cigs ; quit smoking cigarettes 2013.  Substance and Sexual Activity   Alcohol use: No    Alcohol/week: 0.0 standard drinks of alcohol   Drug use: No   Sexual activity: Yes  Other Topics Concern   Not on file  Social History Narrative   Not on file   Social Determinants of Health   Financial Resource Strain: Low Risk  (02/15/2022)   Overall Financial Resource Strain (CARDIA)    Difficulty of Paying Living Expenses: Not hard at all  Food Insecurity: No Food Insecurity (02/15/2022)   Hunger Vital Sign     Worried About Running Out of Food in the Last Year: Never true    Ran Out of Food in the Last Year: Never true  Transportation Needs: No Transportation Needs (02/15/2022)   PRAPARE - Administrator, Civil Service (Medical): No    Lack of Transportation (Non-Medical): No  Physical Activity: Inactive (02/15/2022)   Exercise Vital Sign    Days of Exercise per Week: 0 days    Minutes of Exercise per Session: 0 min  Stress: No Stress Concern Present (02/15/2022)   [employer] of Occupational Health - Occupational Stress Questionnaire    Feeling of Stress : Not at all  Social Connections: Socially Isolated (02/15/2022)   Social Connection and Isolation Panel [NHANES]    Frequency of Communication with Friends and Family: More than three times a week    Frequency of Social Gatherings with Friends and Family: More than three times a week    Attends Religious Services: Never    Database administrator or Organizations: No    Attends Banker Meetings: Never    Marital Status: Widowed  Intimate Partner Violence: Not At Risk (02/15/2022)   Humiliation, Afraid, Rape, and Kick questionnaire    Fear of Current or Ex-Partner: No    Emotionally Abused: No    Physically Abused: No    Sexually Abused: No    Outpatient Medications Prior to Visit  Medication Sig Dispense Refill   Alcohol Swabs (B-D SINGLE USE SWABS REGULAR) PADS USE AS DIRECTED ONCE A DAY AND AS NEEDED 200 each 1   Blood Glucose Calibration (TRUE METRIX LEVEL 3) High SOLN Use as directed. 1 each 0   Blood Glucose Monitoring Suppl (TRUE METRIX METER) w/Device KIT USE AS DIRECTED ONE TIME DAILY  AND AS NEEDED 1 kit 0   calamine lotion Apply 1 application  topically 4 (four) times daily as needed (for itching/irritated skin.).     Cholecalciferol (VITAMIN D3) 5000 UNITS CAPS Take 5,000 Units by mouth 2 (two) times daily. MORNING & AFTERNOON     ferrous sulfate 325 (65 FE) MG tablet TAKE 1 TABLET BY MOUTH EVERY DAY  WITH BREAKFAST 90 tablet 1   metFORMIN (GLUCOPHAGE) 500 MG tablet TAKE 1 TABLET EVERY DAY WITH BREAKFAST 90 tablet 3   Omega-3 Fatty Acids (FISH OIL) 1200 MG CAPS Take 1,200 mg by mouth daily.     pantoprazole (PROTONIX) 40 MG tablet Take 1 tablet (40 mg total) by mouth daily at 6 (six) AM. 30 tablet 0   propranolol (INDERAL) 40 MG tablet Take 1 tablet (40 mg total) by mouth 2 (  two) times daily. 180 tablet 1   simvastatin (ZOCOR) 40 MG tablet Take 1 tablet (40 mg total) by mouth at bedtime. 90 tablet 1   TRUE METRIX BLOOD GLUCOSE TEST test strip USE AS DIRECTED ONE TIME DAILY  AND AS NEEDED 200 strip 1   TRUEplus Lancets 33G MISC USE TO CHECK SUGAR ONCE A DAY AND AS NEEDED. 200 each 1   No facility-administered medications prior to visit.    Allergies  Allergen Reactions   Sulfa Antibiotics Rash    Review of Systems  Constitutional:  Negative for chills and fever.  Respiratory:  Negative for shortness of breath.   Cardiovascular:  Negative for chest pain and palpitations.  Gastrointestinal:  Negative for abdominal pain, blood in stool, constipation, diarrhea, nausea and vomiting.  Genitourinary:  Negative for dysuria, frequency, hematuria and urgency.  Skin:           Neurological:  Negative for headaches.       Objective:    Physical Exam Constitutional:      General: She is not in acute distress.    Appearance: Normal appearance. She is not ill-appearing.  HENT:     Head: Normocephalic and atraumatic.     Right Ear: External ear normal.     Left Ear: External ear normal.     Nose: Nose normal.     Mouth/Throat:     Mouth: Mucous membranes are moist.     Pharynx: Oropharynx is clear.  Eyes:     General:        Right eye: No discharge.        Left eye: No discharge.     Extraocular Movements: Extraocular movements intact.     Conjunctiva/sclera: Conjunctivae normal.     Pupils: Pupils are equal, round, and reactive to light.  Cardiovascular:     Rate and Rhythm:  Normal rate and regular rhythm.     Pulses: Normal pulses.     Heart sounds: Normal heart sounds. No murmur heard.    No gallop.  Pulmonary:     Effort: Pulmonary effort is normal. No respiratory distress.     Breath sounds: Normal breath sounds. No wheezing or rales.  Abdominal:     General: Bowel sounds are normal.     Palpations: Abdomen is soft.     Tenderness: There is no abdominal tenderness. There is no guarding.  Musculoskeletal:        General: Normal range of motion.     Cervical back: Normal range of motion.     Right lower leg: No edema.     Left lower leg: No edema.  Skin:    General: Skin is warm and dry.  Neurological:     Mental Status: She is alert and oriented to person, place, and time.  Psychiatric:        Mood and Affect: Mood normal.        Behavior: Behavior normal.        Judgment: Judgment normal.     There were no vitals taken for this visit. Wt Readings from Last 3 Encounters:  05/03/22 138 lb 6.4 oz (62.8 kg)  10/25/21 139 lb 9.6 oz (63.3 kg)  04/26/21 142 lb 12.8 oz (64.8 kg)    Diabetic Foot Exam - Simple   No data filed    Lab Results  Component Value Date   WBC 7.2 05/03/2022   HGB 14.0 05/03/2022   HCT 42.8 05/03/2022   PLT 238.0 05/03/2022  GLUCOSE 92 08/11/2022   CHOL 145 08/11/2022   TRIG 126.0 08/11/2022   HDL 60.20 08/11/2022   LDLDIRECT 68.0 06/05/2017   LDLCALC 60 08/11/2022   ALT 8 08/11/2022   AST 15 08/11/2022   NA 141 08/11/2022   K 4.9 08/11/2022   CL 103 08/11/2022   CREATININE 0.79 08/11/2022   BUN 14 08/11/2022   CO2 29 08/11/2022   TSH 2.10 05/03/2022   INR 1.0 12/12/2020   HGBA1C 6.5 05/03/2022   MICROALBUR <0.7 10/18/2021    Lab Results  Component Value Date   TSH 2.10 05/03/2022   Lab Results  Component Value Date   WBC 7.2 05/03/2022   HGB 14.0 05/03/2022   HCT 42.8 05/03/2022   MCV 93.1 05/03/2022   PLT 238.0 05/03/2022   Lab Results  Component Value Date   NA 141 08/11/2022   K 4.9  08/11/2022   CO2 29 08/11/2022   GLUCOSE 92 08/11/2022   BUN 14 08/11/2022   CREATININE 0.79 08/11/2022   BILITOT 0.7 08/11/2022   ALKPHOS 69 08/11/2022   AST 15 08/11/2022   ALT 8 08/11/2022   PROT 7.0 08/11/2022   ALBUMIN 4.5 08/11/2022   CALCIUM 9.9 08/11/2022   ANIONGAP 8 12/13/2020   GFR 68.65 08/11/2022   Lab Results  Component Value Date   CHOL 145 08/11/2022   Lab Results  Component Value Date   HDL 60.20 08/11/2022   Lab Results  Component Value Date   LDLCALC 60 08/11/2022   Lab Results  Component Value Date   TRIG 126.0 08/11/2022   Lab Results  Component Value Date   CHOLHDL 2 08/11/2022   Lab Results  Component Value Date   HGBA1C 6.5 05/03/2022      Assessment & Plan:  Healthy Lifestyle: Encouraged 6-8 hours of sleep, heart healthy diet, 60-80 oz of non-alcohol/non-caffeinated fluids, and minimum of 4000 steps daily.  Hyperlipidemia: This is well-controlled with Simvastatin 40 mg and continues to be monitored. There were no medication adjustments today.  Hypertension: This is well-controlled with Propanolol 40 mg and continues to be monitored. There were no medication adjustments today.  Type 2 Diabetes Mellitus: Patient continues taking Metformin 500 mg daily. Her HGBA1C will be checked today.  Vitamin D Deficiency: Patient continues taking vitamin D 2000 IU twice daily. Her vitamin D level will be checked today.  Immunizations: Encouraged patient to consider annual COVID-19 and Influenza vaccinations as well as RSV, Shingles, and Tetanus vaccinations.  Labs: Routine blood work ordered today. Problem List Items Addressed This Visit       Cardiovascular and Mediastinum   Hypertension     Endocrine   Type 2 diabetes mellitus with hemoglobin A1c goal of less than 7.0% (HCC)     Other   Hyperlipidemia - Primary   Vitamin D deficiency   H/O: iron deficiency anemia   No orders of the defined types were placed in this encounter.  I,  Vickey Sages, personally preformed the services described in this documentation.  All medical record entries made by the scribe were at my direction and in my presence.  I have reviewed the chart and discharge instructions (if applicable) and agree that the record reflects my personal performance and is accurate and complete. 11/01/2022  I,Mohammed Iqbal,acting as a scribe for Danise Edge, MD.,have documented all relevant documentation on the behalf of Danise Edge, MD,as directed by  Danise Edge, MD while in the presence of Danise Edge, MD.  Vickey Sages

## 2022-11-01 NOTE — Assessment & Plan Note (Signed)
Encourage heart healthy diet such as MIND or DASH diet, increase exercise, avoid trans fats, simple carbohydrates and processed foods, consider a krill or fish or flaxseed oil cap daily. Tolerating Simvastatin 

## 2022-11-01 NOTE — Assessment & Plan Note (Signed)
Supplement and monitor 

## 2022-11-01 NOTE — Patient Instructions (Signed)
Shingrix is the new shingles shot, 2 shots over 2-6 months, confirm coverage with insurance and document, then can return here for shots with nurse appt or at pharmacy  Tetanus booster at pharmacy RSV, Respiratory Syncitial Virus vaccine, Arexvy at pharmacy   Carbohydrate Counting for Diabetes Mellitus, Adult Carbohydrate counting is a method of keeping track of how many carbohydrates you eat. Eating carbohydrates increases the amount of sugar (glucose) in the blood. Counting how many carbohydrates you eat improves how well you manage your blood glucose. This, in turn, helps you manage your diabetes. Carbohydrates are measured in grams (g) per serving. It is important to know how many carbohydrates (in grams or by serving size) you can have in each meal. This is different for every person. A dietitian can help you make a meal plan and calculate how many carbohydrates you should have at each meal and snack. What foods contain carbohydrates? Carbohydrates are found in the following foods: Grains, such as breads and cereals. Dried beans and soy products. Starchy vegetables, such as potatoes, peas, and corn. Fruit and fruit juices. Milk and yogurt. Sweets and snack foods, such as cake, cookies, candy, chips, and soft drinks. How do I count carbohydrates in foods? There are two ways to count carbohydrates in food. You can read food labels or learn standard serving sizes of foods. You can use either of these methods or a combination of both. Using the Nutrition Facts label The Nutrition Facts list is included on the labels of almost all packaged foods and beverages in the [place]. It includes: The serving size. Information about nutrients in each serving, including the grams of carbohydrate per serving. To use the Nutrition Facts, decide how many servings you will have. Then, multiply the number of servings by the number of carbohydrates per serving. The resulting number is the total grams of  carbohydrates that you will be having. Learning the standard serving sizes of foods When you eat carbohydrate foods that are not packaged or do not include Nutrition Facts on the label, you need to measure the servings in order to count the grams of carbohydrates. Measure the foods that you will eat with a food scale or measuring cup, if needed. Decide how many standard-size servings you will eat. Multiply the number of servings by 15. For foods that contain carbohydrates, one serving equals 15 g of carbohydrates. For example, if you eat 2 cups or 10 oz (300 g) of strawberries, you will have eaten 2 servings and 30 g of carbohydrates (2 servings x 15 g = 30 g). For foods that have more than one food mixed, such as soups and casseroles, you must count the carbohydrates in each food that is included. The following list contains standard serving sizes of common carbohydrate-rich foods. Each of these servings has about 15 g of carbohydrates: 1 slice of bread. 1 six-inch (15 cm) tortilla. ? cup or 2 oz (53 g) cooked rice or pasta.  cup or 3 oz (85 g) cooked or canned, drained and rinsed beans or lentils.  cup or 3 oz (85 g) starchy vegetable, such as peas, corn, or squash.  cup or 4 oz (120 g) hot cereal.  cup or 3 oz (85 g) boiled or mashed potatoes, or  or 3 oz (85 g) of a large baked potato.  cup or 4 fl oz (118 mL) fruit juice. 1 cup or 8 fl oz (237 mL) milk. 1 small or 4 oz (106 g) apple.  or 2  oz (63 g) of a medium banana. 1 cup or 5 oz (150 g) strawberries. 3 cups or 1 oz (28.3 g) popped popcorn. What is an example of carbohydrate counting? To calculate the grams of carbohydrates in this sample meal, follow the steps shown below. Sample meal 3 oz (85 g) chicken breast. ? cup or 4 oz (106 g) brown rice.  cup or 3 oz (85 g) corn. 1 cup or 8 fl oz (237 mL) milk. 1 cup or 5 oz (150 g) strawberries with sugar-free whipped topping. Carbohydrate calculation Identify the foods that  contain carbohydrates: Rice. Corn. Milk. Strawberries. Calculate how many servings you have of each food: 2 servings rice. 1 serving corn. 1 serving milk. 1 serving strawberries. Multiply each number of servings by 15 g: 2 servings rice x 15 g = 30 g. 1 serving corn x 15 g = 15 g. 1 serving milk x 15 g = 15 g. 1 serving strawberries x 15 g = 15 g. Add together all of the amounts to find the total grams of carbohydrates eaten: 30 g + 15 g + 15 g + 15 g = 75 g of carbohydrates total. What are tips for following this plan? Shopping Develop a meal plan and then make a shopping list. Buy fresh and frozen vegetables, fresh and frozen fruit, dairy, eggs, beans, lentils, and whole grains. Look at food labels. Choose foods that have more fiber and less sugar. Avoid processed foods and foods with added sugars. Meal planning Aim to have the same number of grams of carbohydrates at each meal and for each snack time. Plan to have regular, balanced meals and snacks. Where to find more information American Diabetes Association: diabetes.org Centers for Disease Control and Prevention: TonerPromos.no Academy of Nutrition and Dietetics: eatright.org Association of Diabetes Care & Education Specialists: diabeteseducator.org Summary Carbohydrate counting is a method of keeping track of how many carbohydrates you eat. Eating carbohydrates increases the amount of sugar (glucose) in your blood. Counting how many carbohydrates you eat improves how well you manage your blood glucose. This helps you manage your diabetes. A dietitian can help you make a meal plan and calculate how many carbohydrates you should have at each meal and snack. This information is not intended to replace advice given to you by your health care provider. Make sure you discuss any questions you have with your health care provider. Document Revised: 01/22/2020 Document Reviewed: 01/22/2020 Elsevier Patient Education  2023 [employer].

## 2022-12-20 ENCOUNTER — Other Ambulatory Visit: Payer: Self-pay | Admitting: Family Medicine

## 2023-03-01 NOTE — Progress Notes (Signed)
 Pt did not answer phone call for AWV. This encounter was created in error - please disregard.

## 2023-04-06 ENCOUNTER — Encounter: Payer: Medicare HMO | Admitting: *Deleted

## 2023-04-06 NOTE — Progress Notes (Signed)
Pt didn't answer phone for AWV. This encounter was created in error - please disregard.

## 2023-04-07 NOTE — Progress Notes (Signed)
Pt did not answer phone for AWV.   This encounter was created in error - please disregard.

## 2023-05-09 ENCOUNTER — Telehealth: Payer: Self-pay | Admitting: *Deleted

## 2023-05-09 NOTE — Telephone Encounter (Signed)
LMOM for pt to return call to schedule AWVS.

## 2023-05-11 ENCOUNTER — Ambulatory Visit (INDEPENDENT_AMBULATORY_CARE_PROVIDER_SITE_OTHER): Payer: Medicare HMO | Admitting: *Deleted

## 2023-05-11 DIAGNOSIS — Z0000 Encounter for general adult medical examination without abnormal findings: Secondary | ICD-10-CM

## 2023-05-11 NOTE — Progress Notes (Signed)
Subjective:   Natalie Swanson is a 87 y.o. female who presents for Medicare Annual (Subsequent) preventive examination.  Visit Complete: Virtual I connected with  Natalie Swanson on 05/11/23 by a audio enabled telemedicine application and verified that I am speaking with the correct person using two identifiers.  Patient Location: Home  Provider Location: Office/Clinic  I discussed the limitations of evaluation and management by telemedicine. The patient expressed understanding and agreed to proceed.  Vital Signs: Because this visit was a virtual/telehealth visit, some criteria may be missing or patient reported. Any vitals not documented were not able to be obtained and vitals that have been documented are patient reported.   Cardiac Risk Factors include: advanced age (>23men, >44 women);diabetes mellitus;dyslipidemia;hypertension     Objective:    There were no vitals filed for this visit. There is no height or weight on file to calculate BMI.     05/11/2023    1:08 PM 04/07/2023    2:56 PM 04/06/2023    9:03 AM 03/01/2023    9:39 AM 02/15/2022    1:52 PM 01/28/2021   10:24 AM 12/14/2020   11:51 AM  Advanced Directives  Does Patient Have a Medical Advance Directive? Yes Yes Yes Yes Yes Yes No  Type of Advance Directive Living will Healthcare Power of [place];Living will Healthcare Power of [employer] Power of [employer] Power of [employer] Power of [place];Living will Healthcare Power of Attorney  Does patient want to make changes to medical advance directive? No - Patient declined No - Patient declined No - Patient declined No - Patient declined     Copy of Healthcare Power of Attorney in Chart? Yes - validated most recent copy scanned in chart (See row information) Yes - validated most recent copy scanned in chart (See row information) Yes - validated most recent copy scanned in chart (See row information) Yes - validated most recent copy scanned  in chart (See row information) Yes - validated most recent copy scanned in chart (See row information) Yes - validated most recent copy scanned in chart (See row information)   Would patient like information on creating a medical advance directive?       No - Patient declined    Current Medications (verified) Outpatient Encounter Medications as of 05/11/2023  Medication Sig   Alcohol Swabs (B-D SINGLE USE SWABS REGULAR) PADS USE AS DIRECTED ONCE A DAY AND AS NEEDED   Blood Glucose Calibration (TRUE METRIX LEVEL 3) High SOLN Use as directed.   Blood Glucose Monitoring Suppl (TRUE METRIX METER) w/Device KIT USE AS DIRECTED ONE TIME DAILY  AND AS NEEDED   calamine lotion Apply 1 application  topically 4 (four) times daily as needed (for itching/irritated skin.).   Cholecalciferol (VITAMIN D3) 50 MCG (2000 UT) capsule Take 1 capsule (2,000 Units total) by mouth in the morning and at bedtime.   ferrous sulfate 325 (65 FE) MG tablet TAKE 1 TABLET BY MOUTH EVERY DAY WITH BREAKFAST   metFORMIN (GLUCOPHAGE) 500 MG tablet TAKE 1 TABLET EVERY DAY WITH BREAKFAST   Omega-3 Fatty Acids (FISH OIL) 1200 MG CAPS Take 1,200 mg by mouth daily.   pantoprazole (PROTONIX) 40 MG tablet Take 1 tablet (40 mg total) by mouth daily at 6 (six) AM.   propranolol (INDERAL) 40 MG tablet Take 1 tablet (40 mg total) by mouth 2 (two) times daily.   simvastatin (ZOCOR) 40 MG tablet Take 1 tablet (40 mg total) by mouth at bedtime.  TRUE METRIX BLOOD GLUCOSE TEST test strip USE AS DIRECTED ONE TIME DAILY  AND AS NEEDED   TRUEplus Lancets 33G MISC USE TO CHECK SUGAR ONCE A DAY AND AS NEEDED.   No facility-administered encounter medications on file as of 05/11/2023.    Allergies (verified) Sulfa antibiotics   History: Past Medical History:  Diagnosis Date   Abnormal liver function tests 04/03/2015   Atopic dermatitis 01/11/2012   Cataract 07/04/2006   b/l   Chicken pox as a child   DM type 2, goal A1c below 7 07/18/2012    Flashers or floaters, right eye 03/2011   floaters   History of shingles    History of tobacco abuse 01/11/2012   Hx of adenomatous colonic polyps 11/07/2014   Hyperglycemia 07/18/2012   Hyperlipidemia    Hypertension    Measles as a child   Medicare annual wellness visit, subsequent 04/17/2011   G4P1 s/p 1 svd, no abnormal papas, menarche at 14 menopause at 71, regular    Mumps as a child   Pain in joint, ankle and foot 04/27/2014   right   Shingles 03/2011   Vitamin D deficiency 09/14/2014   Past Surgical History:  Procedure Laterality Date   BIOPSY  12/13/2020   Procedure: BIOPSY;  Surgeon: Meryl Dare, MD;  Location: [facility] ENDOSCOPY;  Service: Endoscopy;;   BREAST LUMPECTOMY WITH RADIOACTIVE SEED LOCALIZATION Left 11/16/2016   Procedure: LEFT BREAST LUMPECTOMY WITH RADIOACTIVE SEED LOCALIZATION;  Surgeon: Griselda Miner, MD;  [facility];  Service: General;  Laterality: Left;   CATARACT EXTRACTION W/ INTRAOCULAR LENS  IMPLANT, BILATERAL Bilateral 07-04-2006   CESAREAN SECTION  1981   X 1   COLONOSCOPY WITH PROPOFOL N/A 12/14/2020   Procedure: COLONOSCOPY WITH PROPOFOL;  Surgeon: Lemar Lofty., MD;  Location: [facility] ENDOSCOPY;  Service: Gastroenterology;  Laterality: N/A;   ESOPHAGOGASTRODUODENOSCOPY (EGD) WITH PROPOFOL N/A 12/13/2020   Procedure: ESOPHAGOGASTRODUODENOSCOPY (EGD) WITH PROPOFOL;  Surgeon: Meryl Dare, MD;  Location: [facility] ENDOSCOPY;  Service: Endoscopy;  Laterality: N/A;   POLYPECTOMY  12/14/2020   Procedure: POLYPECTOMY;  Surgeon: Mansouraty, Netty Starring., MD;  Location: [facility] ENDOSCOPY;  Service: Gastroenterology;;   Family History  Problem Relation Age of Onset   Hyperlipidemia Mother    Hypertension Mother    Heart disease Mother        MI   Heart attack Father    Diabetes Father        type 2   Cancer Father        type unknown   Heart attack Brother    Heart disease Brother 74       MI   Hemophilia Son        x 2   Hypertension Son     Colon cancer Neg Hx    Stomach cancer Neg Hx    Social History   Socioeconomic History   Marital status: Widowed    Spouse name: Not on file   Number of children: 4   Years of education: Not on file   Highest education level: Not on file  Occupational History   Occupation: retired  Tobacco Use   Smoking status: Former    Current packs/day: 0.20    Average packs/day: 0.2 packs/day for 61.0 years (12.2 ttl pk-yrs)    Types: Cigarettes   Smokeless tobacco: Never   Tobacco comments:    uses e cigs ; quit smoking cigarettes 2013.  Substance and Sexual Activity   Alcohol use: No  Alcohol/week: 0.0 standard drinks of alcohol   Drug use: No   Sexual activity: Yes  Other Topics Concern   Not on file  Social History Narrative   Not on file   Social Determinants of Health   Financial Resource Strain: Low Risk  (05/11/2023)   Overall Financial Resource Strain (CARDIA)    Difficulty of Paying Living Expenses: Not hard at all  Food Insecurity: No Food Insecurity (05/11/2023)   Hunger Vital Sign    Worried About Running Out of Food in the Last Year: Never true    Ran Out of Food in the Last Year: Never true  Transportation Needs: No Transportation Needs (05/11/2023)   PRAPARE - Administrator, Civil Service (Medical): No    Lack of Transportation (Non-Medical): No  Physical Activity: Inactive (05/11/2023)   Exercise Vital Sign    Days of Exercise per Week: 0 days    Minutes of Exercise per Session: 0 min  Stress: No Stress Concern Present (05/11/2023)   [employer] of Occupational Health - Occupational Stress Questionnaire    Feeling of Stress : Not at all  Social Connections: Socially Isolated (05/11/2023)   Social Connection and Isolation Panel [NHANES]    Frequency of Communication with Friends and Family: More than three times a week    Frequency of Social Gatherings with Friends and Family: Never    Attends Religious Services: Never    Database administrator  or Organizations: No    Attends Banker Meetings: Never    Marital Status: Widowed    Tobacco Counseling Counseling given: Not Answered Tobacco comments: uses e cigs ; quit smoking cigarettes 2013.   Clinical Intake:  Pre-visit preparation completed: Yes  Pain : No/denies pain  Nutritional Risks: None Diabetes: Yes CBG done?: No Did pt. bring in CBG monitor from home?: No  How often do you need to have someone help you when you read instructions, pamphlets, or other written materials from your doctor or pharmacy?: 1 - Never  Interpreter Needed?: No  Information entered by :: Donne Anon, CMA   Activities of Daily Living    05/11/2023    1:03 PM  In your present state of health, do you have any difficulty performing the following activities:  Hearing? 0  Vision? 0  Difficulty concentrating or making decisions? 0  Walking or climbing stairs? 0  Dressing or bathing? 0  Doing errands, shopping? 0  Preparing Food and eating ? N  Using the Toilet? N  In the past six months, have you accidently leaked urine? Y  Do you have problems with loss of bowel control? N  Managing your Medications? N  Managing your Finances? N  Housekeeping or managing your Housekeeping? N    Patient Care Team: Bradd Canary, MD as PCP - General (Family Medicine)  Indicate any recent Medical Services you may have received from other than [facility] providers in the past year (date may be approximate).     Assessment:   This is a routine wellness examination for [place].  Hearing/Vision screen No results found.   Goals Addressed   None    Depression Screen    05/11/2023    1:13 PM 11/01/2022   11:24 AM 05/03/2022    3:40 PM 02/15/2022    1:51 PM 01/28/2021   10:26 AM 01/21/2020    9:00 AM 01/17/2019    9:12 AM  PHQ 2/9 Scores  PHQ - 2 Score 0 0 0 0  0 0 0  PHQ- 9 Score  0 0        Fall Risk    05/11/2023    1:07 PM 11/01/2022   11:24 AM 05/03/2022    3:40 PM 02/15/2022     1:53 PM 01/28/2021   10:26 AM  Fall Risk   Falls in the past year? 0 0 0 0 0  Number falls in past yr: 0 0 0 0 0  Injury with Fall? 0 0 0 0 0  Risk for fall due to : No Fall Risks   Impaired vision   Follow up Falls evaluation completed Falls evaluation completed Falls evaluation completed Falls prevention discussed Falls prevention discussed    MEDICARE RISK AT HOME: Medicare Risk at Home Any stairs in or around the home?: Yes If so, are there any without handrails?: No Home free of loose throw rugs in walkways, pet beds, electrical cords, etc?: Yes Adequate lighting in your home to reduce risk of falls?: Yes Life alert?: No Use of a cane, walker or w/c?: Yes Grab bars in the bathroom?: No Shower chair or bench in shower?: Yes (shower chair) Elevated toilet seat or a handicapped toilet?: No  TIMED UP AND GO:  Was the test performed?  No    Cognitive Function:    07/28/2016   10:14 AM 06/25/2015   10:36 AM  MMSE - Mini Mental State Exam  Orientation to time 5 5  Orientation to Place 5 5  Registration 3 3  Attention/ Calculation 5 5  Recall 3 3  Language- name 2 objects 2 2  Language- repeat 1 1  Language- follow 3 step command 3 3  Language- read & follow direction 1 1  Write a sentence 1 1  Copy design 1 1  Total score 30 30        05/11/2023    1:16 PM 02/15/2022    1:53 PM  6CIT Screen  What Year? 0 points 0 points  What month? 0 points 0 points  What time? 0 points 0 points  Count back from 20 0 points 0 points  Months in reverse 0 points 4 points  Repeat phrase 2 points 0 points  Total Score 2 points 4 points    Immunizations Immunization History  Administered Date(s) Administered   Fluad Quad(high Dose 65+) 04/26/2021, 05/03/2022   Influenza Split 04/06/2011, 07/18/2012   Influenza, High Dose Seasonal PF 06/08/2017   Influenza,inj,Quad PF,6+ Mos 03/13/2013, 04/18/2014, 03/27/2015   Pneumococcal Conjugate-13 07/26/2013   Pneumococcal  Polysaccharide-23 06/25/2015   Tdap 04/06/2011    TDAP status: Due, Education has been provided regarding the importance of this vaccine. Advised may receive this vaccine at local pharmacy or Health Dept. Aware to provide a copy of the vaccination record if obtained from local pharmacy or Health Dept. Verbalized acceptance and understanding.  Flu Vaccine status: Due, Education has been provided regarding the importance of this vaccine. Advised may receive this vaccine at local pharmacy or Health Dept. Aware to provide a copy of the vaccination record if obtained from local pharmacy or Health Dept. Verbalized acceptance and understanding.  Pneumococcal vaccine status: Up to date  Covid-19 vaccine status: Information provided on how to obtain vaccines.   Qualifies for Shingles Vaccine? Yes   Zostavax completed No   Shingrix Completed?: No.    Education has been provided regarding the importance of this vaccine. Patient has been advised to call insurance company to determine out of pocket expense if they have  not yet received this vaccine. Advised may also receive vaccine at local pharmacy or Health Dept. Verbalized acceptance and understanding.  Screening Tests Health Maintenance  Topic Date Due   Zoster Vaccines- Shingrix (1 of 2) Never done   OPHTHALMOLOGY EXAM  02/08/2017   DTaP/Tdap/Td (2 - Td or Tdap) 04/05/2021   FOOT EXAM  04/26/2022   Diabetic kidney evaluation - Urine ACR  10/19/2022   HEMOGLOBIN A1C  11/01/2022   Medicare Annual Wellness (AWV)  02/16/2023   COVID-19 Vaccine (1) 06/23/2023 (Originally 02/13/1941)   INFLUENZA VACCINE  10/02/2023 (Originally 02/02/2023)   Diabetic kidney evaluation - eGFR measurement  08/12/2023   Pneumonia Vaccine 83+ Years old  Completed   DEXA SCAN  Completed   HPV VACCINES  Aged Out    Health Maintenance  Health Maintenance Due  Topic Date Due   Zoster Vaccines- Shingrix (1 of 2) Never done   OPHTHALMOLOGY EXAM  02/08/2017   DTaP/Tdap/Td  (2 - Td or Tdap) 04/05/2021   FOOT EXAM  04/26/2022   Diabetic kidney evaluation - Urine ACR  10/19/2022   HEMOGLOBIN A1C  11/01/2022   Medicare Annual Wellness (AWV)  02/16/2023    Colorectal cancer screening: No longer required.   Mammogram status: No longer required due to age.  Bone Density status: pt declined  Lung Cancer Screening: (Low Dose CT Chest recommended if Age 60-80 years, 20 pack-year currently smoking OR have quit w/in 15years.) does not qualify.   Additional Screening:  Hepatitis C Screening: does not qualify  Vision Screening: Recommended annual ophthalmology exams for early detection of glaucoma and other disorders of the eye. Is the patient up to date with their annual eye exam?  No  Who is the provider or what is the name of the office in which the patient attends annual eye exams? Doesn't have eye doctor at this time If pt is not established with a provider, would they like to be referred to a provider to establish care? No .   Dental Screening: Recommended annual dental exams for proper oral hygiene  Diabetic Foot Exam: Diabetic Foot Exam: Overdue, Pt has been advised about the importance in completing this exam. Pt is scheduled for diabetic foot exam on N/a.  Community Resource Referral / Chronic Care Management: CRR required this visit?  No   CCM required this visit?  No     Plan:     I have personally reviewed and noted the following in the patient's chart:   Medical and social history Use of alcohol, tobacco or illicit drugs  Current medications and supplements including opioid prescriptions. Patient is not currently taking opioid prescriptions. Functional ability and status Nutritional status Physical activity Advanced directives List of other physicians Hospitalizations, surgeries, and ER visits in previous 12 months Vitals Screenings to include cognitive, depression, and falls Referrals and appointments  In addition, I have reviewed  and discussed with patient certain preventive protocols, quality metrics, and best practice recommendations. A written personalized care plan for preventive services as well as general preventive health recommendations were provided to patient.     Donne Anon, CMA   05/11/2023   After Visit Summary: (Declined) Due to this being a telephonic visit, with patients personalized plan was offered to patient but patient Declined AVS at this time   Nurse Notes: None

## 2023-05-11 NOTE — Patient Instructions (Signed)
Natalie Swanson , Thank you for taking time to come for your Medicare Wellness Visit. I appreciate your ongoing commitment to your health goals. Please review the following plan we discussed and let me know if I can assist you in the future.     This is a list of the screening recommended for you and due dates:  Health Maintenance  Topic Date Due   Zoster (Shingles) Vaccine (1 of 2) Never done   Eye exam for diabetics  02/08/2017   DTaP/Tdap/Td vaccine (2 - Td or Tdap) 04/05/2021   Complete foot exam   04/26/2022   Yearly kidney health urinalysis for diabetes  10/19/2022   Hemoglobin A1C  11/01/2022   COVID-19 Vaccine (1) 06/23/2023*   Flu Shot  10/02/2023*   Yearly kidney function blood test for diabetes  08/12/2023   Medicare Annual Wellness Visit  05/10/2024   Pneumonia Vaccine  Completed   DEXA scan (bone density measurement)  Completed   HPV Vaccine  Aged Out  *Topic was postponed. The date shown is not the original due date.    Next appointment: Follow up in one year for your annual wellness visit.   Preventive Care 19 Years and Older, Female Preventive care refers to lifestyle choices and visits with your health care provider that can promote health and wellness. What does preventive care include? A yearly physical exam. This is also called an annual well check. Dental exams once or twice a year. Routine eye exams. Ask your health care provider how often you should have your eyes checked. Personal lifestyle choices, including: Daily care of your teeth and gums. Regular physical activity. Eating a healthy diet. Avoiding tobacco and drug use. Limiting alcohol use. Practicing safe sex. Taking low-dose aspirin every day. Taking vitamin and mineral supplements as recommended by your health care provider. What happens during an annual well check? The services and screenings done by your health care provider during your annual well check will depend on your age, overall  health, lifestyle risk factors, and family history of disease. Counseling  Your health care provider may ask you questions about your: Alcohol use. Tobacco use. Drug use. Emotional well-being. Home and relationship well-being. Sexual activity. Eating habits. History of falls. Memory and ability to understand (cognition). Work and work Astronomer. Reproductive health. Screening  You may have the following tests or measurements: Height, weight, and BMI. Blood pressure. Lipid and cholesterol levels. These may be checked every 5 years, or more frequently if you are over 35 years old. Skin check. Lung cancer screening. You may have this screening every year starting at age 22 if you have a 30-pack-year history of smoking and currently smoke or have quit within the past 15 years. Fecal occult blood test (FOBT) of the stool. You may have this test every year starting at age 91. Flexible sigmoidoscopy or colonoscopy. You may have a sigmoidoscopy every 5 years or a colonoscopy every 10 years starting at age 52. Hepatitis C blood test. Hepatitis B blood test. Sexually transmitted disease (STD) testing. Diabetes screening. This is done by checking your blood sugar (glucose) after you have not eaten for a while (fasting). You may have this done every 1-3 years. Bone density scan. This is done to screen for osteoporosis. You may have this done starting at age 45. Mammogram. This may be done every 1-2 years. Talk to your health care provider about how often you should have regular mammograms. Talk with your health care provider about your test results,  treatment options, and if necessary, the need for more tests. Vaccines  Your health care provider may recommend certain vaccines, such as: Influenza vaccine. This is recommended every year. Tetanus, diphtheria, and acellular pertussis (Tdap, Td) vaccine. You may need a Td booster every 10 years. Zoster vaccine. You may need this after age  79. Pneumococcal 13-valent conjugate (PCV13) vaccine. One dose is recommended after age 55. Pneumococcal polysaccharide (PPSV23) vaccine. One dose is recommended after age 5. Talk to your health care provider about which screenings and vaccines you need and how often you need them. This information is not intended to replace advice given to you by your health care provider. Make sure you discuss any questions you have with your health care provider. Document Released: 07/17/2015 Document Revised: 03/09/2016 Document Reviewed: 04/21/2015 Elsevier Interactive Patient Education  2017 [employer].  Fall Prevention in the Home Falls can cause injuries. They can happen to people of all ages. There are many things you can do to make your home safe and to help prevent falls. What can I do on the outside of my home? Regularly fix the edges of walkways and driveways and fix any cracks. Remove anything that might make you trip as you walk through a door, such as a raised step or threshold. Trim any bushes or trees on the path to your home. Use bright outdoor lighting. Clear any walking paths of anything that might make someone trip, such as rocks or tools. Regularly check to see if handrails are loose or broken. Make sure that both sides of any steps have handrails. Any raised decks and porches should have guardrails on the edges. Have any leaves, snow, or ice cleared regularly. Use sand or salt on walking paths during winter. Clean up any spills in your garage right away. This includes oil or grease spills. What can I do in the bathroom? Use night lights. Install grab bars by the toilet and in the tub and shower. Do not use towel bars as grab bars. Use non-skid mats or decals in the tub or shower. If you need to sit down in the shower, use a plastic, non-slip stool. Keep the floor dry. Clean up any water that spills on the floor as soon as it happens. Remove soap buildup in the tub or shower  regularly. Attach bath mats securely with double-sided non-slip rug tape. Do not have throw rugs and other things on the floor that can make you trip. What can I do in the bedroom? Use night lights. Make sure that you have a light by your bed that is easy to reach. Do not use any sheets or blankets that are too big for your bed. They should not hang down onto the floor. Have a firm chair that has side arms. You can use this for support while you get dressed. Do not have throw rugs and other things on the floor that can make you trip. What can I do in the kitchen? Clean up any spills right away. Avoid walking on wet floors. Keep items that you use a lot in easy-to-reach places. If you need to reach something above you, use a strong step stool that has a grab bar. Keep electrical cords out of the way. Do not use floor polish or wax that makes floors slippery. If you must use wax, use non-skid floor wax. Do not have throw rugs and other things on the floor that can make you trip. What can I do with my stairs? Do  not leave any items on the stairs. Make sure that there are handrails on both sides of the stairs and use them. Fix handrails that are broken or loose. Make sure that handrails are as long as the stairways. Check any carpeting to make sure that it is firmly attached to the stairs. Fix any carpet that is loose or worn. Avoid having throw rugs at the top or bottom of the stairs. If you do have throw rugs, attach them to the floor with carpet tape. Make sure that you have a light switch at the top of the stairs and the bottom of the stairs. If you do not have them, ask someone to add them for you. What else can I do to help prevent falls? Wear shoes that: Do not have high heels. Have rubber bottoms. Are comfortable and fit you well. Are closed at the toe. Do not wear sandals. If you use a stepladder: Make sure that it is fully opened. Do not climb a closed stepladder. Make sure that  both sides of the stepladder are locked into place. Ask someone to hold it for you, if possible. Clearly mark and make sure that you can see: Any grab bars or handrails. First and last steps. Where the edge of each step is. Use tools that help you move around (mobility aids) if they are needed. These include: Canes. Walkers. Scooters. Crutches. Turn on the lights when you go into a dark area. Replace any light bulbs as soon as they burn out. Set up your furniture so you have a clear path. Avoid moving your furniture around. If any of your floors are uneven, fix them. If there are any pets around you, be aware of where they are. Review your medicines with your doctor. Some medicines can make you feel dizzy. This can increase your chance of falling. Ask your doctor what other things that you can do to help prevent falls. This information is not intended to replace advice given to you by your health care provider. Make sure you discuss any questions you have with your health care provider. Document Released: 04/16/2009 Document Revised: 11/26/2015 Document Reviewed: 07/25/2014 Elsevier Interactive Patient Education  2017 [employer].

## 2023-05-21 DIAGNOSIS — M8580 Other specified disorders of bone density and structure, unspecified site: Secondary | ICD-10-CM | POA: Insufficient documentation

## 2023-05-21 DIAGNOSIS — M810 Age-related osteoporosis without current pathological fracture: Secondary | ICD-10-CM | POA: Insufficient documentation

## 2023-05-21 NOTE — Assessment & Plan Note (Signed)
Well controlled, no changes to meds. Encouraged heart healthy diet such as the DASH diet and exercise as tolerated.  °

## 2023-05-21 NOTE — Assessment & Plan Note (Signed)
Encouraged to get adequate exercise, calcium and vitamin d intake 

## 2023-05-21 NOTE — Assessment & Plan Note (Addendum)
Patient encouraged to maintain heart healthy diet, regular exercise, adequate sleep. Consider daily probiotics. Take medications as prescribed. Labs ordered and reviewed. She has aged out of screening colonoscopies, paps and MGMs. Has ACP documents. Last Dexa 2018

## 2023-05-21 NOTE — Assessment & Plan Note (Signed)
hgba1c acceptable, minimize simple carbs. Increase exercise as tolerated. Continue current meds 

## 2023-05-21 NOTE — Assessment & Plan Note (Signed)
Supplement and monitor 

## 2023-05-21 NOTE — Assessment & Plan Note (Signed)
Encourage heart healthy diet such as MIND or DASH diet, increase exercise, avoid trans fats, simple carbohydrates and processed foods, consider a krill or fish or flaxseed oil cap daily. Tolerating Simvastatin 

## 2023-05-23 ENCOUNTER — Encounter: Payer: Self-pay | Admitting: Family Medicine

## 2023-05-23 ENCOUNTER — Ambulatory Visit (INDEPENDENT_AMBULATORY_CARE_PROVIDER_SITE_OTHER): Payer: Medicare HMO | Admitting: Family Medicine

## 2023-05-23 VITALS — BP 136/58 | HR 50 | Temp 98.5°F | Resp 16 | Ht 59.0 in | Wt 141.0 lb

## 2023-05-23 DIAGNOSIS — Z0000 Encounter for general adult medical examination without abnormal findings: Secondary | ICD-10-CM | POA: Diagnosis not present

## 2023-05-23 DIAGNOSIS — E119 Type 2 diabetes mellitus without complications: Secondary | ICD-10-CM

## 2023-05-23 DIAGNOSIS — I10 Essential (primary) hypertension: Secondary | ICD-10-CM | POA: Diagnosis not present

## 2023-05-23 DIAGNOSIS — E785 Hyperlipidemia, unspecified: Secondary | ICD-10-CM | POA: Diagnosis not present

## 2023-05-23 DIAGNOSIS — E559 Vitamin D deficiency, unspecified: Secondary | ICD-10-CM

## 2023-05-23 DIAGNOSIS — Z23 Encounter for immunization: Secondary | ICD-10-CM | POA: Diagnosis not present

## 2023-05-23 DIAGNOSIS — M810 Age-related osteoporosis without current pathological fracture: Secondary | ICD-10-CM

## 2023-05-23 DIAGNOSIS — Z7984 Long term (current) use of oral hypoglycemic drugs: Secondary | ICD-10-CM | POA: Diagnosis not present

## 2023-05-23 LAB — COMPREHENSIVE METABOLIC PANEL
ALT: 10 U/L (ref 0–35)
AST: 17 U/L (ref 0–37)
Albumin: 4.8 g/dL (ref 3.5–5.2)
Alkaline Phosphatase: 65 U/L (ref 39–117)
BUN: 12 mg/dL (ref 6–23)
CO2: 30 meq/L (ref 19–32)
Calcium: 10 mg/dL (ref 8.4–10.5)
Chloride: 101 meq/L (ref 96–112)
Creatinine, Ser: 0.86 mg/dL (ref 0.40–1.20)
GFR: 61.67 mL/min (ref >60.00)
Glucose, Bld: 102 mg/dL — ABNORMAL HIGH (ref 70–99)
Potassium: 4.8 meq/L (ref 3.5–5.1)
Sodium: 138 meq/L (ref 135–145)
Total Bilirubin: 0.8 mg/dL (ref 0.2–1.2)
Total Protein: 7.2 g/dL (ref 6.0–8.3)

## 2023-05-23 LAB — CBC WITH DIFFERENTIAL/PLATELET
Basophils Absolute: 0 10*3/uL (ref 0.0–0.1)
Basophils Relative: 0.4 % (ref 0.0–3.0)
Eosinophils Absolute: 0.3 10*3/uL (ref 0.0–0.7)
Eosinophils Relative: 4.6 % (ref 0.0–5.0)
HCT: 43.8 % (ref 36.0–46.0)
Hemoglobin: 14.2 g/dL (ref 12.0–15.0)
Lymphocytes Relative: 35.7 % (ref 12.0–46.0)
Lymphs Abs: 2.6 10*3/uL (ref 0.7–4.0)
MCHC: 32.5 g/dL (ref 30.0–36.0)
MCV: 95.4 fL (ref 78.0–100.0)
Monocytes Absolute: 0.6 10*3/uL (ref 0.1–1.0)
Monocytes Relative: 8.3 % (ref 3.0–12.0)
Neutro Abs: 3.7 10*3/uL (ref 1.4–7.7)
Neutrophils Relative %: 51 % (ref 43.0–77.0)
Platelets: 219 10*3/uL (ref 150.0–400.0)
RBC: 4.59 Mil/uL (ref 3.87–5.11)
RDW: 13.6 % (ref 11.5–15.5)
WBC: 7.2 10*3/uL (ref 4.0–10.5)

## 2023-05-23 LAB — VITAMIN D 25 HYDROXY (VIT D DEFICIENCY, FRACTURES): VITD: 45.73 ng/mL (ref 30.00–100.00)

## 2023-05-23 LAB — TSH: TSH: 2.08 u[IU]/mL (ref 0.35–5.50)

## 2023-05-23 LAB — LIPID PANEL
Cholesterol: 165 mg/dL (ref 0–200)
HDL: 55.6 mg/dL (ref >39.00)
LDL Cholesterol: 73 mg/dL (ref 0–99)
NonHDL: 109.08
Total CHOL/HDL Ratio: 3
Triglycerides: 180 mg/dL — ABNORMAL HIGH (ref 0.0–149.0)
VLDL: 36 mg/dL (ref 0.0–40.0)

## 2023-05-23 LAB — HEMOGLOBIN A1C: Hgb A1c MFr Bld: 6.5 % (ref 4.6–6.5)

## 2023-05-23 LAB — MICROALBUMIN / CREATININE URINE RATIO
Creatinine,U: 19.5 mg/dL
Microalb Creat Ratio: 3.6 mg/g (ref 0.0–30.0)
Microalb, Ur: <0.7 mg/dL (ref 0.0–1.9)

## 2023-05-23 NOTE — Patient Instructions (Addendum)
Protein every 3-4 hours and 10 ounces of fluids every 1-2 hours total of 60-80 ounces in 24 hours.   Tetanus at pharmacy  Shingrix is the new shingles shot, 2 shots over 2-6 months, confirm coverage with insurance and document, then can return here for shots with nurse appt or at pharmacy   Annual Covid booster   Preventive Care 65 Years and Older, Female Preventive care refers to lifestyle choices and visits with your health care provider that can promote health and wellness. Preventive care visits are also called wellness exams. What can I expect for my preventive care visit? Counseling Your health care provider may ask you questions about your: Medical history, including: Past medical problems. Family medical history. Pregnancy and menstrual history. History of falls. Current health, including: Memory and ability to understand (cognition). Emotional well-being. Home life and relationship well-being. Sexual activity and sexual health. Lifestyle, including: Alcohol, nicotine or tobacco, and drug use. Access to firearms. Diet, exercise, and sleep habits. Work and work Astronomer. Sunscreen use. Safety issues such as seatbelt and bike helmet use. Physical exam Your health care provider will check your: Height and weight. These may be used to calculate your BMI (body mass index). BMI is a measurement that tells if you are at a healthy weight. Waist circumference. This measures the distance around your waistline. This measurement also tells if you are at a healthy weight and may help predict your risk of certain diseases, such as type 2 diabetes and high blood pressure. Heart rate and blood pressure. Body temperature. Skin for abnormal spots. What immunizations do I need?  Vaccines are usually given at various ages, according to a schedule. Your health care provider will recommend vaccines for you based on your age, medical history, and lifestyle or other factors, such as travel  or where you work. What tests do I need? Screening Your health care provider may recommend screening tests for certain conditions. This may include: Lipid and cholesterol levels. Hepatitis C test. Hepatitis B test. HIV (human immunodeficiency virus) test. STI (sexually transmitted infection) testing, if you are at risk. Lung cancer screening. Colorectal cancer screening. Diabetes screening. This is done by checking your blood sugar (glucose) after you have not eaten for a while (fasting). Mammogram. Talk with your health care provider about how often you should have regular mammograms. BRCA-related cancer screening. This may be done if you have a family history of breast, ovarian, tubal, or peritoneal cancers. Bone density scan. This is done to screen for osteoporosis. Talk with your health care provider about your test results, treatment options, and if necessary, the need for more tests. Follow these instructions at home: Eating and drinking  Eat a diet that includes fresh fruits and vegetables, whole grains, lean protein, and low-fat dairy products. Limit your intake of foods with high amounts of sugar, saturated fats, and salt. Take vitamin and mineral supplements as recommended by your health care provider. Do not drink alcohol if your health care provider tells you not to drink. If you drink alcohol: Limit how much you have to 0-1 drink a day. Know how much alcohol is in your drink. In the [place] one drink equals one 12 oz bottle of beer (355 mL), one 5 oz glass of wine (148 mL), or one 1 oz glass of hard liquor (44 mL). Lifestyle Brush your teeth every morning and night with fluoride toothpaste. Floss one time each day. Exercise for at least 30 minutes 5 or more days each week. Do not  use any products that contain nicotine or tobacco. These products include cigarettes, chewing tobacco, and vaping devices, such as e-cigarettes. If you need help quitting, ask your health care  provider. Do not use drugs. If you are sexually active, practice safe sex. Use a condom or other form of protection in order to prevent STIs. Take aspirin only as told by your health care provider. Make sure that you understand how much to take and what form to take. Work with your health care provider to find out whether it is safe and beneficial for you to take aspirin daily. Ask your health care provider if you need to take a cholesterol-lowering medicine (statin). Find healthy ways to manage stress, such as: Meditation, yoga, or listening to music. Journaling. Talking to a trusted person. Spending time with friends and family. Minimize exposure to UV radiation to reduce your risk of skin cancer. Safety Always wear your seat belt while driving or riding in a vehicle. Do not drive: If you have been drinking alcohol. Do not ride with someone who has been drinking. When you are tired or distracted. While texting. If you have been using any mind-altering substances or drugs. Wear a helmet and other protective equipment during sports activities. If you have firearms in your house, make sure you follow all gun safety procedures. What's next? Visit your health care provider once a year for an annual wellness visit. Ask your health care provider how often you should have your eyes and teeth checked. Stay up to date on all vaccines. This information is not intended to replace advice given to you by your health care provider. Make sure you discuss any questions you have with your health care provider. Document Revised: 12/16/2020 Document Reviewed: 12/16/2020 Elsevier Patient Education  2024 [employer].

## 2023-05-23 NOTE — Progress Notes (Signed)
Subjective:    Patient ID: Natalie Swanson, female    DOB: 05-28-1936, 87 y.o.   MRN: [id number]  Chief Complaint  Patient presents with   Annual Exam    HPI Discussed the use of AI scribe software for clinical note transcription with the patient, who gave verbal consent to proceed.  History of Present Illness   The patient, with a history of osteoporosis, reports no recent major health issues. They are generally active, eating well, and hydrating regularly. The patient has been taking vitamin D and calcium supplements. They report no recent changes in vision or hearing. The patient has been experiencing some pain due to a large ventral hernia, but there have been no changes in bowel movements or other symptoms. The patient has been managing their conditions with medications, but there is a discussion about possibly increasing the dosage for better management. The patient has not had a DEXA scan in over five years, and the doctor suggests a repeat scan to assess the current state of the patient's bone health.        Past Medical History:  Diagnosis Date   Abnormal liver function tests 04/03/2015   Atopic dermatitis 01/11/2012   Cataract 07/04/2006   b/l   Chicken pox as a child   DM type 2, goal A1c below 7 07/18/2012   Flashers or floaters, right eye 03/2011   floaters   History of shingles    History of tobacco abuse 01/11/2012   Hx of adenomatous colonic polyps 11/07/2014   Hyperglycemia 07/18/2012   Hyperlipidemia    Hypertension    Measles as a child   Medicare annual wellness visit, subsequent 04/17/2011   G4P1 s/p 1 svd, no abnormal papas, menarche at 14 menopause at 40, regular    Mumps as a child   Pain in joint, ankle and foot 04/27/2014   right   Shingles 03/2011   Vitamin D deficiency 09/14/2014    Past Surgical History:  Procedure Laterality Date   BIOPSY  12/13/2020   Procedure: BIOPSY;  Surgeon: Meryl Dare, MD;  Location: [facility] ENDOSCOPY;  Service:  Endoscopy;;   BREAST LUMPECTOMY WITH RADIOACTIVE SEED LOCALIZATION Left 11/16/2016   Procedure: LEFT BREAST LUMPECTOMY WITH RADIOACTIVE SEED LOCALIZATION;  Surgeon: Griselda Miner, MD;  Location: [facility] OR;  Service: General;  Laterality: Left;   CATARACT EXTRACTION W/ INTRAOCULAR LENS  IMPLANT, BILATERAL Bilateral 07-04-2006   CESAREAN SECTION  1981   X 1   COLONOSCOPY WITH PROPOFOL N/A 12/14/2020   Procedure: COLONOSCOPY WITH PROPOFOL;  Surgeon: Lemar Lofty., MD;  Location: [facility] ENDOSCOPY;  Service: Gastroenterology;  Laterality: N/A;   ESOPHAGOGASTRODUODENOSCOPY (EGD) WITH PROPOFOL N/A 12/13/2020   Procedure: ESOPHAGOGASTRODUODENOSCOPY (EGD) WITH PROPOFOL;  Surgeon: Meryl Dare, MD;  Location: [facility] ENDOSCOPY;  Service: Endoscopy;  Laterality: N/A;   POLYPECTOMY  12/14/2020   Procedure: POLYPECTOMY;  Surgeon: Mansouraty, Netty Starring., MD;  Location: [facility] ENDOSCOPY;  Service: Gastroenterology;;    Family History  Problem Relation Age of Onset   Hyperlipidemia Mother    Hypertension Mother    Heart disease Mother        MI   Heart attack Father    Diabetes Father        type 2   Cancer Father        type unknown   Heart attack Brother    Heart disease Brother 43       MI   Hemophilia Son  x 2   Hypertension Son    Colon cancer Neg Hx    Stomach cancer Neg Hx     Social History   Socioeconomic History   Marital status: Widowed    Spouse name: Not on file   Number of children: 4   Years of education: Not on file   Highest education level: Not on file  Occupational History   Occupation: retired  Tobacco Use   Smoking status: Former    Current packs/day: 0.20    Average packs/day: 0.2 packs/day for 61.0 years (12.2 ttl pk-yrs)    Types: Cigarettes   Smokeless tobacco: Never   Tobacco comments:    uses e cigs ; quit smoking cigarettes 2013.  Substance and Sexual Activity   Alcohol use: No    Alcohol/week: 0.0 standard drinks of alcohol   Drug use: No   Sexual  activity: Yes  Other Topics Concern   Not on file  Social History Narrative   Not on file   Social Determinants of Health   Financial Resource Strain: Low Risk  (05/11/2023)   Overall Financial Resource Strain (CARDIA)    Difficulty of Paying Living Expenses: Not hard at all  Food Insecurity: No Food Insecurity (05/11/2023)   Hunger Vital Sign    Worried About Running Out of Food in the Last Year: Never true    Ran Out of Food in the Last Year: Never true  Transportation Needs: No Transportation Needs (05/11/2023)   PRAPARE - Administrator, Civil Service (Medical): No    Lack of Transportation (Non-Medical): No  Physical Activity: Inactive (05/11/2023)   Exercise Vital Sign    Days of Exercise per Week: 0 days    Minutes of Exercise per Session: 0 min  Stress: No Stress Concern Present (05/11/2023)   [employer] of Occupational Health - Occupational Stress Questionnaire    Feeling of Stress : Not at all  Social Connections: Socially Isolated (05/11/2023)   Social Connection and Isolation Panel [NHANES]    Frequency of Communication with Friends and Family: More than three times a week    Frequency of Social Gatherings with Friends and Family: Never    Attends Religious Services: Never    Database administrator or Organizations: No    Attends Banker Meetings: Never    Marital Status: Widowed  Intimate Partner Violence: Not At Risk (05/11/2023)   Humiliation, Afraid, Rape, and Kick questionnaire    Fear of Current or Ex-Partner: No    Emotionally Abused: No    Physically Abused: No    Sexually Abused: No    Outpatient Medications Prior to Visit  Medication Sig Dispense Refill   Alcohol Swabs (B-D SINGLE USE SWABS REGULAR) PADS USE AS DIRECTED ONCE A DAY AND AS NEEDED 200 each 1   Blood Glucose Calibration (TRUE METRIX LEVEL 3) High SOLN Use as directed. 1 each 0   Blood Glucose Monitoring Suppl (TRUE METRIX METER) w/Device KIT USE AS DIRECTED ONE  TIME DAILY  AND AS NEEDED 1 kit 0   calamine lotion Apply 1 application  topically 4 (four) times daily as needed (for itching/irritated skin.).     Cholecalciferol (VITAMIN D3) 50 MCG (2000 UT) capsule Take 1 capsule (2,000 Units total) by mouth in the morning and at bedtime.     ferrous sulfate 325 (65 FE) MG tablet TAKE 1 TABLET BY MOUTH EVERY DAY WITH BREAKFAST 90 tablet 1   metFORMIN (GLUCOPHAGE) 500 MG tablet  TAKE 1 TABLET EVERY DAY WITH BREAKFAST 90 tablet 3   Omega-3 Fatty Acids (FISH OIL) 1200 MG CAPS Take 1,200 mg by mouth daily.     propranolol (INDERAL) 40 MG tablet Take 1 tablet (40 mg total) by mouth 2 (two) times daily. 180 tablet 1   simvastatin (ZOCOR) 40 MG tablet Take 1 tablet (40 mg total) by mouth at bedtime. 90 tablet 1   TRUE METRIX BLOOD GLUCOSE TEST test strip USE AS DIRECTED ONE TIME DAILY  AND AS NEEDED 200 strip 1   TRUEplus Lancets 33G MISC USE TO CHECK SUGAR ONCE A DAY AND AS NEEDED. 200 each 1   pantoprazole (PROTONIX) 40 MG tablet Take 1 tablet (40 mg total) by mouth daily at 6 (six) AM. 30 tablet 0   No facility-administered medications prior to visit.    Allergies  Allergen Reactions   Sulfa Antibiotics Rash    Review of Systems  Constitutional:  Negative for fever and malaise/fatigue.  HENT:  Negative for congestion.   Eyes:  Negative for blurred vision.  Respiratory:  Negative for shortness of breath.   Cardiovascular:  Negative for chest pain, palpitations and leg swelling.  Gastrointestinal:  Negative for abdominal pain, blood in stool and nausea.  Genitourinary:  Negative for dysuria and frequency.  Musculoskeletal:  Negative for falls.  Skin:  Negative for rash.  Neurological:  Negative for dizziness, loss of consciousness and headaches.  Endo/Heme/Allergies:  Negative for environmental allergies.  Psychiatric/Behavioral:  Negative for depression. The patient is not nervous/anxious.        Objective:    Physical Exam Constitutional:       General: She is not in acute distress.    Appearance: Normal appearance. She is well-developed. She is not toxic-appearing.  HENT:     Head: Normocephalic and atraumatic.     Right Ear: External ear normal.     Left Ear: External ear normal.     Nose: Nose normal.  Eyes:     General:        Right eye: No discharge.        Left eye: No discharge.     Conjunctiva/sclera: Conjunctivae normal.  Neck:     Thyroid: No thyromegaly.  Cardiovascular:     Rate and Rhythm: Normal rate and regular rhythm.     Heart sounds: Normal heart sounds. No murmur heard. Pulmonary:     Effort: Pulmonary effort is normal. No respiratory distress.     Breath sounds: Normal breath sounds.  Abdominal:     General: Bowel sounds are normal.     Palpations: Abdomen is soft.     Tenderness: There is no abdominal tenderness. There is no guarding.  Musculoskeletal:        General: Normal range of motion.     Cervical back: Neck supple.  Lymphadenopathy:     Cervical: No cervical adenopathy.  Skin:    General: Skin is warm and dry.  Neurological:     Mental Status: She is alert and oriented to person, place, and time.  Psychiatric:        Mood and Affect: Mood normal.        Behavior: Behavior normal.        Thought Content: Thought content normal.        Judgment: Judgment normal.     BP (!) 136/58 (BP Location: Left Arm, Patient Position: Sitting, Cuff Size: Normal)   Pulse (!) 50   Temp 98.5 F (36.9 C) (  Oral)   Resp 16   Ht 4\' 11"  (1.499 m)   Wt 141 lb (64 kg)   BMI 28.48 kg/m  Wt Readings from Last 3 Encounters:  05/23/23 141 lb (64 kg)  11/01/22 142 lb (64.4 kg)  05/03/22 138 lb 6.4 oz (62.8 kg)    Diabetic Foot Exam - Simple   Simple Foot Form Diabetic Foot exam was performed with the following findings: Yes 05/23/2023 11:50 AM  Visual Inspection No deformities, no ulcerations, no other skin breakdown bilaterally: Yes Sensation Testing Intact to touch and monofilament testing  bilaterally: Yes Pulse Check Posterior Tibialis and Dorsalis pulse intact bilaterally: Yes Comments    Lab Results  Component Value Date   WBC 7.2 05/23/2023   HGB 14.2 05/23/2023   HCT 43.8 05/23/2023   PLT 219.0 05/23/2023   GLUCOSE 102 (H) 05/23/2023   CHOL 165 05/23/2023   TRIG 180.0 (H) 05/23/2023   HDL 55.60 05/23/2023   LDLDIRECT 68.0 06/05/2017   LDLCALC 73 05/23/2023   ALT 10 05/23/2023   AST 17 05/23/2023   NA 138 05/23/2023   K 4.8 05/23/2023   CL 101 05/23/2023   CREATININE 0.86 05/23/2023   BUN 12 05/23/2023   CO2 30 05/23/2023   TSH 2.08 05/23/2023   INR 1.0 12/12/2020   HGBA1C 6.5 05/23/2023   MICROALBUR <0.7 05/23/2023    Lab Results  Component Value Date   TSH 2.08 05/23/2023   Lab Results  Component Value Date   WBC 7.2 05/23/2023   HGB 14.2 05/23/2023   HCT 43.8 05/23/2023   MCV 95.4 05/23/2023   PLT 219.0 05/23/2023   Lab Results  Component Value Date   NA 138 05/23/2023   K 4.8 05/23/2023   CO2 30 05/23/2023   GLUCOSE 102 (H) 05/23/2023   BUN 12 05/23/2023   CREATININE 0.86 05/23/2023   BILITOT 0.8 05/23/2023   ALKPHOS 65 05/23/2023   AST 17 05/23/2023   ALT 10 05/23/2023   PROT 7.2 05/23/2023   ALBUMIN 4.8 05/23/2023   CALCIUM 10.0 05/23/2023   ANIONGAP 8 12/13/2020   GFR 61.67 05/23/2023   Lab Results  Component Value Date   CHOL 165 05/23/2023   Lab Results  Component Value Date   HDL 55.60 05/23/2023   Lab Results  Component Value Date   LDLCALC 73 05/23/2023   Lab Results  Component Value Date   TRIG 180.0 (H) 05/23/2023   Lab Results  Component Value Date   CHOLHDL 3 05/23/2023   Lab Results  Component Value Date   HGBA1C 6.5 05/23/2023       Assessment & Plan:  Hyperlipidemia, unspecified hyperlipidemia type Assessment & Plan: Encourage heart healthy diet such as MIND or DASH diet, increase exercise, avoid trans fats, simple carbohydrates and processed foods, consider a krill or fish or flaxseed oil  cap daily. Tolerating Simvastatin  Orders: -     Lipid panel  Primary hypertension Assessment & Plan: Well controlled, no changes to meds. Encouraged heart healthy diet such as the DASH diet and exercise as tolerated.   Orders: -     CBC with Differential/Platelet -     Comprehensive metabolic panel -     TSH  Type 2 diabetes mellitus with hemoglobin A1c goal of less than 7.0% (HCC) Assessment & Plan: hgba1c acceptable, minimize simple carbs. Increase exercise as tolerated. Continue current meds   Orders: -     Hemoglobin A1c -     Microalbumin / creatinine urine  ratio  Vitamin D deficiency Assessment & Plan: Supplement and monitor   Orders: -     VITAMIN D 25 Hydroxy (Vit-D Deficiency, Fractures)  Preventative health care Assessment & Plan: Patient encouraged to maintain heart healthy diet, regular exercise, adequate sleep. Consider daily probiotics. Take medications as prescribed. Labs ordered and reviewed. She has aged out of screening colonoscopies, paps and MGMs. Has ACP documents. Last Dexa 2018   Osteoporosis, unspecified osteoporosis type, unspecified pathological fracture presence Assessment & Plan: Encouraged to get adequate exercise, calcium and vitamin d intake   Orders: -     DG Bone Density; Future  Need for influenza vaccination -     Flu Vaccine Trivalent High Dose (Fluad)    Assessment and Plan    Nutrition and Hydration Discussed the importance of regular protein intake and hydration for maintaining muscle mass and overall health in older age. -Increase protein intake every 3-4 hours. -Drink 60-80 ounces of water in 24 hours, ideally 10 ounces every 1-2 hours.  Social Interaction Emphasized the importance of maintaining social connections for overall well-being. -Continue to engage with community, family, and friends.  Eye Health Upcoming appointment with an ophthalmologist. -Request a copy of the ophthalmologist's note for continuity of  care.  Foot Health No current issues reported. Difficulty with nail care due to limited mobility. -Consider referral to a podiatrist if foot care becomes problematic.  Vaccinations Tetanus overdue, shingles vaccination started, annual flu shot received, and COVID-19 booster considered. -Consider receiving tetanus shot. -Continue with shingles vaccination series. -Consider receiving COVID-19 booster.  Bone Health History of osteoporosis. Last DEXA scan in 2018. -Order DEXA scan to assess current bone density. -Discuss potential need for medication (Fosamax or Prolia) based on DEXA scan results.  Ventral Hernia Large hernia causing increased pain. -Refer to general surgery for consultation.  Lab Work Last completed in February. -Order basic lab work to check kidneys, liver, anemia, thyroid, and Vitamin D levels.  Follow-up No immediate concerns. -Return for follow-up in 2025 unless issues arise.         Danise Edge, MD

## 2023-05-24 ENCOUNTER — Encounter: Payer: Self-pay | Admitting: *Deleted

## 2023-06-05 ENCOUNTER — Ambulatory Visit (HOSPITAL_BASED_OUTPATIENT_CLINIC_OR_DEPARTMENT_OTHER)
Admission: RE | Admit: 2023-06-05 | Discharge: 2023-06-05 | Disposition: A | Discharge status: Home / Self Care | Payer: Medicare HMO | Source: Ambulatory Visit | Attending: Family Medicine | Admitting: Family Medicine

## 2023-06-05 DIAGNOSIS — M810 Age-related osteoporosis without current pathological fracture: Secondary | ICD-10-CM | POA: Diagnosis not present

## 2023-06-05 DIAGNOSIS — M8588 Other specified disorders of bone density and structure, other site: Secondary | ICD-10-CM | POA: Diagnosis not present

## 2023-06-05 DIAGNOSIS — Z780 Asymptomatic menopausal state: Secondary | ICD-10-CM | POA: Diagnosis not present

## 2023-06-07 ENCOUNTER — Other Ambulatory Visit: Payer: Self-pay | Admitting: Family Medicine

## 2023-09-19 ENCOUNTER — Other Ambulatory Visit: Payer: Self-pay | Admitting: Family Medicine

## 2023-09-19 DIAGNOSIS — E119 Type 2 diabetes mellitus without complications: Secondary | ICD-10-CM

## 2023-09-19 NOTE — Telephone Encounter (Signed)
 Copied from CRM [id number]. Topic: Clinical - Medication Refill >> Sep 19, 2023 10:51 AM Kathryne Eriksson wrote: Most Recent Primary Care Visit:  Provider: Danise Edge A  Department: [facility]  Visit Type: PHYSICAL  Date: 05/23/2023  Medication: simvastatin (ZOCOR) 40 MG tablet , metFORMIN (GLUCOPHAGE) 500 MG tablet  Has the patient contacted their pharmacy? Yes (Agent: If no, request that the patient contact the pharmacy for the refill. If patient does not wish to contact the pharmacy document the reason why and proceed with request.) (Agent: If yes, when and what did the pharmacy advise?)  Is this the correct pharmacy for this prescription? Yes If no, delete pharmacy and type the correct one.  This is the patient's preferred pharmacy:   [facility] [place], [place] - [address] AT Portal to Registered Caremark Sites [address] [place] [address] Phone: [phone] Fax: [phone]   Has the prescription been filled recently? No  Is the patient out of the medication? Yes  Has the patient been seen for an appointment in the last year OR does the patient have an upcoming appointment? Yes  Can we respond through MyChart? Yes  Agent: Please be advised that Rx refills may take up to 3 business days. We ask that you follow-up with your pharmacy.

## 2023-09-20 MED ORDER — METFORMIN HCL 500 MG PO TABS: 500.0000 mg | ORAL_TABLET | Freq: Every day | ORAL | 3 refills | Status: AC

## 2023-09-20 MED ORDER — SIMVASTATIN 40 MG PO TABS: 40.0000 mg | ORAL_TABLET | Freq: Every day | ORAL | 1 refills | Status: DC

## 2023-09-27 ENCOUNTER — Other Ambulatory Visit: Payer: Self-pay | Admitting: Family Medicine

## 2023-09-27 NOTE — Telephone Encounter (Unsigned)
 Copied from CRM [id number]. Topic: Clinical - Medication Refill >> Sep 27, 2023  9:39 AM Alcus Dad wrote: Most Recent Primary Care Visit:  Provider: Danise Edge A  Department: [facility]  Visit Type: PHYSICAL  Date: 05/23/2023  Medication: metFORMIN (GLUCOPHAGE) 500 MG tablet  Has the patient contacted their pharmacy? Yes (Agent: If no, request that the patient contact the pharmacy for the refill. If patient does not wish to contact the pharmacy document the reason why and proceed with request.) (Agent: If yes, when and what did the pharmacy advise?)  Is this the correct pharmacy for this prescription? Yes If no, delete pharmacy and type the correct one.  This is the patient's preferred pharmacy[facility]  [facility] [address] [place] - [address] [address] Phone: [phone] Fax: [phone]   Has the prescription been filled recently? No  Is the patient out of the medication? No  Has the patient been seen for an appointment in the last year OR does the patient have an upcoming appointment? Yes  Can we respond through MyChart? No  Agent: Please be advised that Rx refills may take up to 3 business days. We ask that you follow-up with your pharmacy.

## 2023-11-14 ENCOUNTER — Telehealth: Payer: Self-pay | Admitting: Family Medicine

## 2023-11-14 NOTE — Telephone Encounter (Signed)
 Lvm for pt advising her to r/s with [facility] based on Dr. Tanner Fanny availability.

## 2023-11-14 NOTE — Telephone Encounter (Signed)
 Copied from CRM [id number]. Topic: Appointments - Scheduling Inquiry for Clinic >> Nov 14, 2023 11:09 AM Alethia Huxley E wrote: Reason for CRM: Patient called in needing to reschedule her 6 month follow-up that was supposed to be on May 22nd. Agent tried populating new dates for the patient and it was getting pushed out until October. Patient questioning if she can still get seen around end of May or early June. Callback number for patient is [phone].

## 2023-11-21 ENCOUNTER — Other Ambulatory Visit: Payer: Self-pay | Admitting: Family Medicine

## 2023-11-21 MED ORDER — PROPRANOLOL HCL 40 MG PO TABS
40.0000 mg | ORAL_TABLET | Freq: Two times a day (BID) | ORAL | 0 refills | Status: DC
Start: 2023-11-21 — End: 2024-02-20

## 2023-11-21 NOTE — Telephone Encounter (Signed)
 Copied from CRM [id number]. Topic: Clinical - Medication Refill >> Nov 21, 2023  8:23 AM Baldomero Bone wrote: Medication: propranolol  (INDERAL ) 40 MG tablet  Has the patient contacted their pharmacy? No (Agent: If no, request that the patient contact the pharmacy for the refill. If patient does not wish to contact the pharmacy document the reason why and proceed with request.) (Agent: If yes, when and what did the pharmacy advise?)  This is the patient's preferred pharmacy:  [id number] [place], [place] - [address] [place] [address] Phone: [phone] Fax: [phone]  Is this the correct pharmacy for this prescription? Yes If no, delete pharmacy and type the correct one.   Has the prescription been filled recently? No  Is the patient out of the medication? No  Has the patient been seen for an appointment in the last year OR does the patient have an upcoming appointment? Yes  Can we respond through MyChart? No  Agent: Please be advised that Rx refills may take up to 3 business days. We ask that you follow-up with your pharmacy.

## 2023-11-23 ENCOUNTER — Ambulatory Visit: Payer: Medicare HMO | Admitting: Family Medicine

## 2024-02-20 ENCOUNTER — Other Ambulatory Visit: Payer: Self-pay | Admitting: Family Medicine

## 2024-03-24 ENCOUNTER — Other Ambulatory Visit: Payer: Self-pay | Admitting: Family Medicine

## 2024-04-09 ENCOUNTER — Ambulatory Visit (INDEPENDENT_AMBULATORY_CARE_PROVIDER_SITE_OTHER): Admitting: *Deleted

## 2024-04-09 VITALS — Ht 59.0 in | Wt 141.0 lb

## 2024-04-09 DIAGNOSIS — Z0000 Encounter for general adult medical examination without abnormal findings: Secondary | ICD-10-CM

## 2024-04-09 NOTE — Progress Notes (Signed)
 Please attest this visit in the absence of patient primary care provider.    Subjective:   Natalie Swanson is a 88 y.o. who presents for a Medicare Wellness preventive visit.  As a reminder, Annual Wellness Visits don't include a physical exam, and some assessments may be limited, especially if this visit is performed virtually. We may recommend an in-person follow-up visit with your provider if needed.  Visit Complete: Virtual I connected with  Natalie Swanson on 04/09/24 by a audio enabled telemedicine application and verified that I am speaking with the correct person using two identifiers.  Patient Location: Home  Provider Location: Office/Clinic  I discussed the limitations of evaluation and management by telemedicine. The patient expressed understanding and agreed to proceed.  Vital Signs: Because this visit was a virtual/telehealth visit, some criteria may be missing or patient reported. Any vitals not documented were not able to be obtained and vitals that have been documented are patient reported.  VideoDeclined- This patient declined Librarian, academic. Therefore the visit was completed with audio only.  Persons Participating in Visit: Patient.  AWV Questionnaire: Swanson: Patient Medicare AWV questionnaire was not completed prior to this visit.  Cardiac Risk Factors include: advanced age (>15men, >24 women);diabetes mellitus;dyslipidemia;hypertension     Objective:    Today's Vitals   04/09/24 1021  Weight: 141 lb (64 kg)  Height: 4' 11 (1.499 m)   Body mass index is 28.48 kg/m.     04/09/2024   10:39 AM 05/11/2023    1:08 PM 04/07/2023    2:56 PM 04/06/2023    9:03 AM 03/01/2023    9:39 AM 02/15/2022    1:52 PM 01/28/2021   10:24 AM  Advanced Directives  Does Patient Have a Medical Advance Directive? Yes Yes Yes Yes Yes Yes Yes  Type of Advance Directive Living will Living will Healthcare Power of [place];Living will Healthcare  Power of [employer] Power of [employer] Power of [employer] Power of [place];Living will  Does patient want to make changes to medical advance directive?  Swanson - Patient declined Swanson - Patient declined Swanson - Patient declined Swanson - Patient declined    Copy of Healthcare Power of Attorney in Chart?  Yes - validated most recent copy scanned in chart (See row information) Yes - validated most recent copy scanned in chart (See row information) Yes - validated most recent copy scanned in chart (See row information) Yes - validated most recent copy scanned in chart (See row information) Yes - validated most recent copy scanned in chart (See row information) Yes - validated most recent copy scanned in chart (See row information)    Current Medications (verified) Outpatient Encounter Medications as of 04/09/2024  Medication Sig   Alcohol  Swabs  (B-D SINGLE USE SWABS  REGULAR) PADS USE AS DIRECTED ONCE A DAY AND AS NEEDED   Blood Glucose Calibration (TRUE METRIX LEVEL 3) High SOLN Use as directed.   Blood Glucose Monitoring Suppl (TRUE METRIX METER) w/Device KIT USE AS DIRECTED ONE TIME DAILY  AND AS NEEDED   Cholecalciferol (VITAMIN D3) 50 MCG (2000 UT) capsule Take 1 capsule (2,000 Units total) by mouth in the morning and at bedtime.   ferrous sulfate  325 (65 FE) MG tablet TAKE 1 TABLET BY MOUTH EVERY DAY WITH BREAKFAST   metFORMIN  (GLUCOPHAGE ) 500 MG tablet Take 1 tablet (500 mg total) by mouth daily with breakfast.   Omega-3 Fatty Acids (FISH OIL) 1200 MG CAPS Take 1,200 mg by  mouth daily.   propranolol  (INDERAL ) 40 MG tablet TAKE 1 TABLET BY MOUTH TWICE A DAY   simvastatin  (ZOCOR ) 40 MG tablet TAKE 1 TABLET BY MOUTH EVERYDAY AT BEDTIME   TRUE METRIX BLOOD GLUCOSE TEST test strip USE AS DIRECTED ONE TIME DAILY  AND AS NEEDED   TRUEplus Lancets 33G MISC USE TO CHECK SUGAR ONCE A DAY AND AS NEEDED.   pantoprazole  (PROTONIX ) 40 MG tablet Take 1 tablet (40 mg total) by mouth daily at 6  (six) AM. (Patient not taking: Reported on 04/09/2024)   [DISCONTINUED] calamine lotion Apply 1 application  topically 4 (four) times daily as needed (for itching/irritated skin.).   Swanson facility-administered encounter medications on file as of 04/09/2024.    Allergies (verified) Sulfa antibiotics   History: Past Medical History:  Diagnosis Date   Abnormal liver function tests 04/03/2015   Atopic dermatitis 01/11/2012   Cataract 07/04/2006   b/l   Chicken pox as a child   DM type 2, goal A1c below 7 07/18/2012   Flashers or floaters, right eye 03/2011   floaters   History of shingles    History of tobacco abuse 01/11/2012   Hx of adenomatous colonic polyps 11/07/2014   Hyperglycemia 07/18/2012   Hyperlipidemia    Hypertension    Measles as a child   Medicare annual wellness visit, subsequent 04/17/2011   G4P1 s/p 1 svd, Swanson abnormal papas, menarche at 14 menopause at 63, regular    Mumps as a child   Pain in joint, ankle and foot 04/27/2014   right   Shingles 03/2011   Vitamin D  deficiency 09/14/2014   Past Surgical History:  Procedure Laterality Date   BIOPSY  12/13/2020   Procedure: BIOPSY;  Surgeon: Aneita Gwendlyn DASEN, MD;  Location: [facility] ENDOSCOPY;  Service: Endoscopy;;   BREAST LUMPECTOMY WITH RADIOACTIVE SEED LOCALIZATION Left 11/16/2016   Procedure: LEFT BREAST LUMPECTOMY WITH RADIOACTIVE SEED LOCALIZATION;  Surgeon: Curvin Deward MOULD, MD;  Location: [facility] OR;  Service: General;  Laterality: Left;   CATARACT EXTRACTION W/ INTRAOCULAR LENS  IMPLANT, BILATERAL Bilateral 07-04-2006   CESAREAN SECTION  1981   X 1   COLONOSCOPY WITH PROPOFOL  N/A 12/14/2020   Procedure: COLONOSCOPY WITH PROPOFOL ;  Surgeon: Wilhelmenia Aloha Raddle., MD;  Location: [facility] ENDOSCOPY;  Service: Gastroenterology;  Laterality: N/A;   ESOPHAGOGASTRODUODENOSCOPY (EGD) WITH PROPOFOL  N/A 12/13/2020   Procedure: ESOPHAGOGASTRODUODENOSCOPY (EGD) WITH PROPOFOL ;  Surgeon: Aneita Gwendlyn DASEN, MD;  Location: [facility] ENDOSCOPY;   Service: Endoscopy;  Laterality: N/A;   POLYPECTOMY  12/14/2020   Procedure: POLYPECTOMY;  Surgeon: Mansouraty, Aloha Raddle., MD;  Location: [facility] ENDOSCOPY;  Service: Gastroenterology;;   Family History  Problem Relation Age of Onset   Hyperlipidemia Mother    Hypertension Mother    Heart disease Mother        MI   Heart attack Father    Diabetes Father        type 2   Cancer Father        type unknown   Heart attack Brother    Heart disease Brother 32       MI   Hemophilia Son        x 2   Hypertension Son    Colon cancer Neg Hx    Stomach cancer Neg Hx    Social History   Socioeconomic History   Marital status: Widowed    Spouse name: Not on file   Number of children: 4   Years of education: Not  on file   Highest education level: Not on file  Occupational History   Occupation: retired  Tobacco Use   Smoking status: Former    Current packs/day: 0.20    Average packs/day: 0.2 packs/day for 61.0 years (12.2 ttl pk-yrs)    Types: Cigarettes   Smokeless tobacco: Never   Tobacco comments:    uses e cigs ; quit smoking cigarettes 2013.  Substance and Sexual Activity   Alcohol  use: Swanson    Alcohol /week: 0.0 standard drinks of alcohol    Drug use: Swanson   Sexual activity: Yes  Other Topics Concern   Not on file  Social History Narrative   Not on file   Social Drivers of Health   Financial Resource Strain: Low Risk  (04/09/2024)   Overall Financial Resource Strain (CARDIA)    Difficulty of Paying Living Expenses: Not very hard  Food Insecurity: Swanson Food Insecurity (04/09/2024)   Hunger Vital Sign    Worried About Running Out of Food in the Last Year: Never true    Ran Out of Food in the Last Year: Never true  Transportation Needs: Swanson Transportation Needs (04/09/2024)   PRAPARE - Administrator, Civil Service (Medical): Swanson    Lack of Transportation (Non-Medical): Swanson  Physical Activity: Insufficiently Active (04/09/2024)   Exercise Vital Sign    Days of Exercise  per Week: 7 days    Minutes of Exercise per Session: 20 min  Stress: Swanson Stress Concern Present (04/09/2024)   [employer] of Occupational Health - Occupational Stress Questionnaire    Feeling of Stress: Not at all  Social Connections: Socially Isolated (04/09/2024)   Social Connection and Isolation Panel    Frequency of Communication with Friends and Family: Twice a week    Frequency of Social Gatherings with Friends and Family: More than three times a week    Attends Religious Services: Never    Database administrator or Organizations: Swanson    Attends Banker Meetings: Never    Marital Status: Widowed    Tobacco Counseling Counseling given: Not Answered Tobacco comments: uses e cigs ; quit smoking cigarettes 2013.    Clinical Intake:  Pre-visit preparation completed: Yes  Pain : Swanson/denies pain     BMI - recorded: 28.48 Nutritional Status: BMI 25 -29 Overweight Nutritional Risks: None Diabetes: Yes CBG done?: Swanson Did pt. bring in CBG monitor from home?: Swanson  Lab Results  Component Value Date   HGBA1C 6.5 05/23/2023   HGBA1C 6.5 05/03/2022   HGBA1C 7.0 (H) 10/18/2021     How often do you need to have someone help you when you read instructions, pamphlets, or other written materials from your doctor or pharmacy?: 1 - Never  Interpreter Needed?: Swanson  Information entered by :: Lolita Libra, CMA(AAMA)   Activities of Daily Living     04/09/2024   10:30 AM 05/11/2023    1:03 PM  In your present state of health, do you have any difficulty performing the following activities:  Hearing? 0 0  Vision? 0 0  Difficulty concentrating or making decisions? 0 0  Walking or climbing stairs? 0 0  Dressing or bathing? 0 0  Doing errands, shopping? 0 0  Preparing Food and eating ? N N  Using the Toilet? N N  In the past six months, have you accidently leaked urine? Natalie Swanson  Comment Wears pads, doesn't feel like it is a big enough concern to discuss with PCP  Do  you have problems with loss of bowel control? N N  Managing your Medications? N N  Managing your Finances? N N  Housekeeping or managing your Housekeeping? N N    Patient Care Team: Domenica Harlene LABOR, MD as PCP - General (Family Medicine)  I have updated your Care Teams any recent Medical Services you may have received from other providers in the past year.     Assessment:   This is a routine wellness examination for Natalie Swanson.  Hearing/Vision screen Hearing Screening - Comments:: Denies hearing difficulties, has intermittent wax buildup.  Vision Screening - Comments:: Past due for eye exam with [facility] in [place]. Thinking of changing to [facility].   Goals Addressed               This Visit's Progress     Remain active and independent. (pt-stated)   On track     Healthy diet, exercise, and regular check up with PCP.         Depression Screen     04/09/2024   10:37 AM 05/23/2023   11:00 AM 05/11/2023    1:13 PM 11/01/2022   11:24 AM 05/03/2022    3:40 PM 02/15/2022    1:51 PM 01/28/2021   10:26 AM  PHQ 2/9 Scores  PHQ - 2 Score 0 0 0 0 0 0 0  PHQ- 9 Score 0   0 0      Fall Risk     04/09/2024   10:29 AM 05/23/2023   11:00 AM 05/11/2023    1:07 PM 11/01/2022   11:24 AM 05/03/2022    3:40 PM  Fall Risk   Falls in the past year? 0 0 0 0 0  Number falls in past yr: 0 0 0 0 0  Injury with Fall? 0 0 0 0 0  Risk for fall due to : Impaired balance/gait Swanson Fall Risks Swanson Fall Risks    Follow up Education provided Falls evaluation completed Falls evaluation completed Falls evaluation completed Falls evaluation completed      Data saved with a previous flowsheet row definition    MEDICARE RISK AT HOME:  Medicare Risk at Home Any stairs in or around the home?: Yes If so, are there any without handrails?: Swanson Home free of loose throw rugs in walkways, pet beds, electrical cords, etc?: Yes Adequate lighting in your home to reduce risk of falls?: Yes Life alert?:  Swanson Use of a cane, walker or w/c?: Swanson (has walker if needed) Grab bars in the bathroom?: Swanson Shower chair or bench in shower?: Yes Elevated toilet seat or a handicapped toilet?: Swanson  TIMED UP AND GO:  Was the test performed?  Swanson, audio  Cognitive Function: 6CIT completed    07/28/2016   10:14 AM 06/25/2015   10:36 AM  MMSE - Mini Mental State Exam  Orientation to time 5  5   Orientation to Place 5  5   Registration 3  3   Attention/ Calculation 5  5   Recall 3  3   Language- name 2 objects 2  2   Language- repeat 1 1  Language- follow 3 step command 3  3   Language- read & follow direction 1  1   Write a sentence 1  1   Copy design 1  1   Total score 30  30      Data saved with a previous flowsheet row definition  04/09/2024   10:39 AM 05/11/2023    1:16 PM 02/15/2022    1:53 PM  6CIT Screen  What Year? 0 points 0 points 0 points  What month? 0 points 0 points 0 points  What time? 0 points 0 points 0 points  Count back from 20 0 points 0 points 0 points  Months in reverse 2 points 0 points 4 points  Repeat phrase 0 points 2 points 0 points  Total Score 2 points 2 points 4 points    Immunizations Immunization History  Administered Date(s) Administered   Fluad Quad(high Dose 65+) 04/26/2021, 05/03/2022   Fluad Trivalent(High Dose 65+) 05/23/2023   INFLUENZA, HIGH DOSE SEASONAL PF 06/08/2017   Influenza Split 04/06/2011, 07/18/2012   Influenza,inj,Quad PF,6+ Mos 03/13/2013, 04/18/2014, 03/27/2015   Pneumococcal Conjugate Pcv21, Polysaccharide Crm197 Conjugaf 11/16/2023   Pneumococcal Conjugate-13 07/26/2013   Pneumococcal Polysaccharide-23 06/25/2015   Respiratory Syncytial Virus Vaccine,Recomb Aduvanted(Arexvy) 04/29/2023   Rsv, Mab, [place], 1 Ml, Neonate To 24 Mos(Beyfortus) 04/29/2023   Tdap 04/06/2011   Zoster Recombinant(Shingrix) 11/16/2023, 01/16/2024    Screening Tests Health Maintenance  Topic Date Due   COVID-19 Vaccine (1) Never done    OPHTHALMOLOGY EXAM  02/08/2017   DTaP/Tdap/Td (2 - Td or Tdap) 04/05/2021   HEMOGLOBIN A1C  11/20/2023   Influenza Vaccine  02/02/2024   Medicare Annual Wellness (AWV)  05/10/2024   FOOT EXAM  05/22/2024   Pneumococcal Vaccine: 50+ Years  Completed   DEXA SCAN  Completed   Zoster Vaccines- Shingrix  Completed   Meningococcal B Vaccine  Aged Out    Health Maintenance Items Addressed: Will get flu and tetanus vaccines at pharmacy. Will schedule diabetic eye exam next month.  Additional Screening:  Vision Screening: Recommended annual ophthalmology exams for early detection of glaucoma and other disorders of the eye. Is the patient up to date with their annual eye exam?  Swanson  Who is the provider or what is the name of the office in which the patient attends annual eye exams? Will call to schedule with Maurie Dose  Dental Screening: Recommended annual dental exams for proper oral hygiene  Community Resource Referral / Chronic Care Management: CRR required this visit?  Swanson   CCM required this visit?  Swanson   Plan:    I have personally reviewed and noted the following in the patient's chart:   Medical and social history Use of alcohol , tobacco or illicit drugs  Current medications and supplements including opioid prescriptions. Patient is not currently taking opioid prescriptions. Functional ability and status Nutritional status Physical activity Advanced directives List of other physicians Hospitalizations, surgeries, and ER visits in previous 12 months Vitals Screenings to include cognitive, depression, and falls Referrals and appointments  In addition, I have reviewed and discussed with patient certain preventive protocols, quality metrics, and best practice recommendations. A written personalized care plan for preventive services as well as general preventive health recommendations were provided to patient.   Lolita Libra, CMA   04/09/2024   After Visit Summary:  (Mail) Due to this being a telephonic visit, the after visit summary with patients personalized plan was offered to patient via mail   Notes: Nothing significant to report at this time.

## 2024-04-09 NOTE — Patient Instructions (Addendum)
 Ms. Biggers , Thank you for taking time out of your busy schedule to complete your Annual Wellness Visit with me. I enjoyed our conversation and look forward to speaking with you again next year. I, as well as your care team,  appreciate your ongoing commitment to your health goals. Please review the following plan we discussed and let me know if I can assist you in the future. Your Game plan/ To Do List    Referrals: If you haven't heard from the office you've been referred to, please reach out to them at the phone provided.   Please schedule a diabetic eye exam with Maurie Dose soon:  [phone]  Follow up Visits: Next Medicare AWV with our clinical staff: 04/10/25 10:20am, telephone  Next Office Visit with your provider: 05/27/24 3:40pm, Dr Domenica  Clinician Recommendations:  Aim for 30 minutes of exercise or brisk walking, 6-8 glasses of water, and 5 servings of fruits and vegetables each day.   You will need to get the following vaccines at your local pharmacy:  Flu, Tetanus      This is a list of the screening recommended for you and due dates:  Health Maintenance  Topic Date Due   COVID-19 Vaccine (1) Never done   Zoster (Shingles) Vaccine (1 of 2) Never done   Eye exam for diabetics  02/08/2017   DTaP/Tdap/Td vaccine (2 - Td or Tdap) 04/05/2021   Hemoglobin A1C  11/20/2023   Flu Shot  02/02/2024   Medicare Annual Wellness Visit  05/10/2024   Complete foot exam   05/22/2024   Pneumococcal Vaccine for age over 42  Completed   DEXA scan (bone density measurement)  Completed   Meningitis B Vaccine  Aged Out    Advanced directives: (In Chart) A copy of your advanced directives are scanned into your chart should your provider ever need it. Advance Care Planning is important because it:  [x]  Makes sure you receive the medical care that is consistent with your values, goals, and preferences  [x]  It provides guidance to your family and loved ones and reduces their  decisional burden about whether or not they are making the right decisions based on your wishes.  Follow the link provided in your after visit summary or read over the paperwork we have mailed to you to help you started getting your Advance Directives in place. If you need assistance in completing these, please reach out to us  so that we can help you!  See attachments for Preventive Care and Fall Prevention Tips.

## 2024-05-26 NOTE — Assessment & Plan Note (Signed)
 Encouraged to get adequate exercise, calcium and vitamin d intake

## 2024-05-26 NOTE — Assessment & Plan Note (Signed)
 hgba1c acceptable, minimize simple carbs. Increase exercise as tolerated. Continue current meds

## 2024-05-26 NOTE — Assessment & Plan Note (Signed)
 Supplement and monitor

## 2024-05-27 ENCOUNTER — Ambulatory Visit: Admitting: Family Medicine

## 2024-05-27 ENCOUNTER — Encounter: Payer: Self-pay | Admitting: Family Medicine

## 2024-05-27 VITALS — Resp 16 | Ht 59.0 in | Wt 141.0 lb

## 2024-05-27 DIAGNOSIS — Z0000 Encounter for general adult medical examination without abnormal findings: Secondary | ICD-10-CM | POA: Diagnosis not present

## 2024-05-27 DIAGNOSIS — E1159 Type 2 diabetes mellitus with other circulatory complications: Secondary | ICD-10-CM | POA: Diagnosis not present

## 2024-05-27 DIAGNOSIS — E119 Type 2 diabetes mellitus without complications: Secondary | ICD-10-CM

## 2024-05-27 DIAGNOSIS — E559 Vitamin D deficiency, unspecified: Secondary | ICD-10-CM

## 2024-05-27 DIAGNOSIS — I152 Hypertension secondary to endocrine disorders: Secondary | ICD-10-CM

## 2024-05-27 DIAGNOSIS — E785 Hyperlipidemia, unspecified: Secondary | ICD-10-CM

## 2024-05-27 DIAGNOSIS — Z1231 Encounter for screening mammogram for malignant neoplasm of breast: Secondary | ICD-10-CM

## 2024-05-27 DIAGNOSIS — I10 Essential (primary) hypertension: Secondary | ICD-10-CM | POA: Diagnosis not present

## 2024-05-27 DIAGNOSIS — M8580 Other specified disorders of bone density and structure, unspecified site: Secondary | ICD-10-CM | POA: Diagnosis not present

## 2024-05-27 DIAGNOSIS — M810 Age-related osteoporosis without current pathological fracture: Secondary | ICD-10-CM

## 2024-05-27 NOTE — Patient Instructions (Signed)
 Tetanus is due Annual flu shot  Preventive Care 65 Years and Older, Female Preventive care refers to lifestyle choices and visits with your health care provider that can promote health and wellness. Preventive care visits are also called wellness exams. What can I expect for my preventive care visit? Counseling Your health care provider may ask you questions about your: Medical history, including: Past medical problems. Family medical history. Pregnancy and menstrual history. History of falls. Current health, including: Memory and ability to understand (cognition). Emotional well-being. Home life and relationship well-being. Sexual activity and sexual health. Lifestyle, including: Alcohol , nicotine or tobacco, and drug use. Access to firearms. Diet, exercise, and sleep habits. Work and work astronomer. Sunscreen use. Safety issues such as seatbelt and bike helmet use. Physical exam Your health care provider will check your: Height and weight. These may be used to calculate your BMI (body mass index). BMI is a measurement that tells if you are at a healthy weight. Waist circumference. This measures the distance around your waistline. This measurement also tells if you are at a healthy weight and may help predict your risk of certain diseases, such as type 2 diabetes and high blood pressure. Heart rate and blood pressure. Body temperature. Skin for abnormal spots. What immunizations do I need?  Vaccines are usually given at various ages, according to a schedule. Your health care provider will recommend vaccines for you based on your age, medical history, and lifestyle or other factors, such as travel or where you work. What tests do I need? Screening Your health care provider may recommend screening tests for certain conditions. This may include: Lipid and cholesterol levels. Hepatitis C test. Hepatitis B test. HIV (human immunodeficiency virus) test. STI (sexually  transmitted infection) testing, if you are at risk. Lung cancer screening. Colorectal cancer screening. Diabetes screening. This is done by checking your blood sugar (glucose) after you have not eaten for a while (fasting). Mammogram. Talk with your health care provider about how often you should have regular mammograms. BRCA-related cancer screening. This may be done if you have a family history of breast, ovarian, tubal, or peritoneal cancers. Bone density scan. This is done to screen for osteoporosis. Talk with your health care provider about your test results, treatment options, and if necessary, the need for more tests. Follow these instructions at home: Eating and drinking  Eat a diet that includes fresh fruits and vegetables, whole grains, lean protein, and low-fat dairy products. Limit your intake of foods with high amounts of sugar, saturated fats, and salt. Take vitamin and mineral supplements as recommended by your health care provider. Do not drink alcohol  if your health care provider tells you not to drink. If you drink alcohol : Limit how much you have to 0-1 drink a day. Know how much alcohol  is in your drink. In the [place] one drink equals one 12 oz bottle of beer (355 mL), one 5 oz glass of wine (148 mL), or one 1 oz glass of hard liquor (44 mL). Lifestyle Brush your teeth every morning and night with fluoride toothpaste. Floss one time each day. Exercise for at least 30 minutes 5 or more days each week. Do not use any products that contain nicotine or tobacco. These products include cigarettes, chewing tobacco, and vaping devices, such as e-cigarettes. If you need help quitting, ask your health care provider. Do not use drugs. If you are sexually active, practice safe sex. Use a condom or other form of protection in order to prevent  STIs. Take aspirin  only as told by your health care provider. Make sure that you understand how much to take and what form to take. Work with your  health care provider to find out whether it is safe and beneficial for you to take aspirin  daily. Ask your health care provider if you need to take a cholesterol-lowering medicine (statin). Find healthy ways to manage stress, such as: Meditation, yoga, or listening to music. Journaling. Talking to a trusted person. Spending time with friends and family. Minimize exposure to UV radiation to reduce your risk of skin cancer. Safety Always wear your seat belt while driving or riding in a vehicle. Do not drive: If you have been drinking alcohol . Do not ride with someone who has been drinking. When you are tired or distracted. While texting. If you have been using any mind-altering substances or drugs. Wear a helmet and other protective equipment during sports activities. If you have firearms in your house, make sure you follow all gun safety procedures. What's next? Visit your health care provider once a year for an annual wellness visit. Ask your health care provider how often you should have your eyes and teeth checked. Stay up to date on all vaccines. This information is not intended to replace advice given to you by your health care provider. Make sure you discuss any questions you have with your health care provider. Document Revised: 12/16/2020 Document Reviewed: 12/16/2020 Elsevier Patient Education  2024 [employer].

## 2024-05-28 LAB — COMPREHENSIVE METABOLIC PANEL WITH GFR
ALT: 12 U/L (ref 0–35)
AST: 20 U/L (ref 0–37)
Albumin: 4.7 g/dL (ref 3.5–5.2)
Alkaline Phosphatase: 62 U/L (ref 39–117)
BUN: 13 mg/dL (ref 6–23)
CO2: 30 meq/L (ref 19–32)
Calcium: 10.5 mg/dL (ref 8.4–10.5)
Chloride: 99 meq/L (ref 96–112)
Creatinine, Ser: 0.86 mg/dL (ref 0.40–1.20)
GFR: 61.23 mL/min (ref >60.00)
Glucose, Bld: 97 mg/dL (ref 70–99)
Potassium: 4.9 meq/L (ref 3.5–5.1)
Sodium: 137 meq/L (ref 135–145)
Total Bilirubin: 0.9 mg/dL (ref 0.2–1.2)
Total Protein: 7.3 g/dL (ref 6.0–8.3)

## 2024-05-28 LAB — CBC WITH DIFFERENTIAL/PLATELET
Basophils Absolute: 0.1 K/uL (ref 0.0–0.1)
Basophils Relative: 1 % (ref 0.0–3.0)
Eosinophils Absolute: 0.4 K/uL (ref 0.0–0.7)
Eosinophils Relative: 5.2 % — ABNORMAL HIGH (ref 0.0–5.0)
HCT: 41.9 % (ref 36.0–46.0)
Hemoglobin: 14.1 g/dL (ref 12.0–15.0)
Lymphocytes Relative: 32.1 % (ref 12.0–46.0)
Lymphs Abs: 2.4 K/uL (ref 0.7–4.0)
MCHC: 33.7 g/dL (ref 30.0–36.0)
MCV: 92.2 fl (ref 78.0–100.0)
Monocytes Absolute: 0.6 K/uL (ref 0.1–1.0)
Monocytes Relative: 8.3 % (ref 3.0–12.0)
Neutro Abs: 4.1 K/uL (ref 1.4–7.7)
Neutrophils Relative %: 53.4 % (ref 43.0–77.0)
Platelets: 208 K/uL (ref 150.0–400.0)
RBC: 4.54 Mil/uL (ref 3.87–5.11)
RDW: 13.4 % (ref 11.5–15.5)
WBC: 7.6 K/uL (ref 4.0–10.5)

## 2024-05-28 LAB — LIPID PANEL
Cholesterol: 135 mg/dL (ref 0–200)
HDL: 56.7 mg/dL (ref >39.00)
LDL Cholesterol: 50 mg/dL (ref 0–99)
NonHDL: 78.53
Total CHOL/HDL Ratio: 2
Triglycerides: 144 mg/dL (ref 0.0–149.0)
VLDL: 28.8 mg/dL (ref 0.0–40.0)

## 2024-05-28 LAB — HEMOGLOBIN A1C: Hgb A1c MFr Bld: 6.2 % (ref 4.6–6.5)

## 2024-05-28 LAB — MICROALBUMIN / CREATININE URINE RATIO
Creatinine,U: 28.2 mg/dL
Microalb Creat Ratio: UNDETERMINED mg/g (ref 0.0–30.0)
Microalb, Ur: <0.7 mg/dL

## 2024-05-28 LAB — VITAMIN D 25 HYDROXY (VIT D DEFICIENCY, FRACTURES): VITD: 43.66 ng/mL (ref 30.00–100.00)

## 2024-05-28 LAB — TSH: TSH: 3.69 u[IU]/mL (ref 0.35–5.50)
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
## 2024-05-28 NOTE — Assessment & Plan Note (Signed)
 Encourage heart healthy diet such as MIND or DASH diet, increase exercise, avoid trans fats, simple carbohydrates and processed foods, consider a krill or fish or flaxseed oil cap daily.

## 2024-05-28 NOTE — Assessment & Plan Note (Signed)
 Well controlled, no changes to meds. Encouraged heart healthy diet such as the DASH diet and exercise as tolerated.

## 2024-05-28 NOTE — Assessment & Plan Note (Addendum)
 Patient encouraged to maintain heart healthy diet, regular exercise, adequate sleep. Consider daily probiotics. Take medications as prescribed. Labs ordered and reviewed. She has aged out of screening colonoscopies, paps but agrees to [facility], ordered today Has ACP documents. Last Dexa 2024 repeat every 2-5 years

## 2024-06-02 ENCOUNTER — Ambulatory Visit: Payer: Self-pay | Admitting: Family Medicine

## 2024-12-02 ENCOUNTER — Ambulatory Visit: Admitting: Family Medicine

## 2025-04-11 ENCOUNTER — Ambulatory Visit

## 2025-06-12 ENCOUNTER — Encounter: Admitting: Family Medicine
